# Patient Record
Sex: Male | Born: 1975 | Race: White | Hispanic: No | Marital: Married | Smoking: Former smoker
Health system: Southern US, Community
[De-identification: ages and names within clinical notes are randomized; demographics above are authoritative.]

## PROBLEM LIST (undated history)

## (undated) DIAGNOSIS — K30 Functional dyspepsia: Secondary | ICD-10-CM

## (undated) DIAGNOSIS — R600 Localized edema: Secondary | ICD-10-CM

## (undated) DIAGNOSIS — E669 Obesity, unspecified: Secondary | ICD-10-CM

## (undated) DIAGNOSIS — G4700 Insomnia, unspecified: Secondary | ICD-10-CM

## (undated) DIAGNOSIS — M1990 Unspecified osteoarthritis, unspecified site: Secondary | ICD-10-CM

## (undated) DIAGNOSIS — I10 Essential (primary) hypertension: Secondary | ICD-10-CM

## (undated) DIAGNOSIS — R7301 Impaired fasting glucose: Secondary | ICD-10-CM

## (undated) DIAGNOSIS — R7303 Prediabetes: Secondary | ICD-10-CM

## (undated) DIAGNOSIS — K219 Gastro-esophageal reflux disease without esophagitis: Secondary | ICD-10-CM

## (undated) DIAGNOSIS — K4090 Unilateral inguinal hernia, without obstruction or gangrene, not specified as recurrent: Secondary | ICD-10-CM

## (undated) DIAGNOSIS — R7401 Elevation of levels of liver transaminase levels: Secondary | ICD-10-CM

## (undated) DIAGNOSIS — K760 Fatty (change of) liver, not elsewhere classified: Secondary | ICD-10-CM

## (undated) DIAGNOSIS — K7581 Nonalcoholic steatohepatitis (NASH): Secondary | ICD-10-CM

## (undated) DIAGNOSIS — E785 Hyperlipidemia, unspecified: Secondary | ICD-10-CM

## (undated) DIAGNOSIS — K429 Umbilical hernia without obstruction or gangrene: Secondary | ICD-10-CM

## (undated) DIAGNOSIS — Z87898 Personal history of other specified conditions: Secondary | ICD-10-CM

## (undated) DIAGNOSIS — R0789 Other chest pain: Secondary | ICD-10-CM

## (undated) HISTORY — DX: Other chest pain: R07.89

## (undated) HISTORY — PX: NO PAST SURGERIES: SHX2092

## (undated) HISTORY — DX: Insomnia, unspecified: G47.00

## (undated) HISTORY — DX: Gastro-esophageal reflux disease without esophagitis: K21.9

## (undated) HISTORY — DX: Essential (primary) hypertension: I10

## (undated) HISTORY — DX: Nonalcoholic steatohepatitis (NASH): K75.81

## (undated) HISTORY — DX: Obesity, unspecified: E66.9

## (undated) HISTORY — DX: Impaired fasting glucose: R73.01

## (undated) HISTORY — DX: Hyperlipidemia, unspecified: E78.5

## (undated) HISTORY — DX: Functional dyspepsia: K30

---
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
## 1999-07-19 ENCOUNTER — Emergency Department (HOSPITAL_COMMUNITY): Admission: EM | Admit: 1999-07-19 | Discharge: 1999-07-20 | Payer: Self-pay | Admitting: Emergency Medicine

## 1999-11-29 ENCOUNTER — Emergency Department (HOSPITAL_COMMUNITY): Admission: EM | Admit: 1999-11-29 | Discharge: 1999-11-29 | Payer: Self-pay | Admitting: *Deleted

## 1999-12-01 ENCOUNTER — Emergency Department (HOSPITAL_COMMUNITY): Admission: EM | Admit: 1999-12-01 | Discharge: 1999-12-01 | Payer: Self-pay

## 2000-12-16 ENCOUNTER — Emergency Department (HOSPITAL_COMMUNITY): Admission: EM | Admit: 2000-12-16 | Discharge: 2000-12-16 | Payer: Self-pay | Admitting: Emergency Medicine

## 2000-12-26 ENCOUNTER — Emergency Department (HOSPITAL_COMMUNITY): Admission: EM | Admit: 2000-12-26 | Discharge: 2000-12-26 | Payer: Self-pay | Admitting: Emergency Medicine

## 2005-03-06 ENCOUNTER — Emergency Department (HOSPITAL_COMMUNITY): Admission: EM | Admit: 2005-03-06 | Discharge: 2005-03-06 | Payer: Self-pay | Admitting: Emergency Medicine

## 2005-04-20 ENCOUNTER — Ambulatory Visit: Payer: Self-pay | Admitting: Internal Medicine

## 2005-07-05 DIAGNOSIS — K30 Functional dyspepsia: Secondary | ICD-10-CM

## 2005-07-05 HISTORY — PX: OTHER SURGICAL HISTORY: SHX169

## 2005-07-05 HISTORY — DX: Functional dyspepsia: K30

## 2006-01-31 ENCOUNTER — Ambulatory Visit (HOSPITAL_COMMUNITY): Admission: RE | Admit: 2006-01-31 | Discharge: 2006-01-31 | Payer: Self-pay | Admitting: Gastroenterology

## 2006-02-03 ENCOUNTER — Ambulatory Visit (HOSPITAL_COMMUNITY): Admission: RE | Admit: 2006-02-03 | Discharge: 2006-02-03 | Payer: Self-pay | Admitting: Gastroenterology

## 2006-02-15 ENCOUNTER — Ambulatory Visit: Payer: Self-pay | Admitting: Internal Medicine

## 2006-03-08 ENCOUNTER — Ambulatory Visit: Payer: Self-pay | Admitting: Internal Medicine

## 2006-03-14 ENCOUNTER — Ambulatory Visit (HOSPITAL_COMMUNITY): Admission: RE | Admit: 2006-03-14 | Discharge: 2006-03-14 | Payer: Self-pay | Admitting: Gastroenterology

## 2006-03-14 ENCOUNTER — Encounter (INDEPENDENT_AMBULATORY_CARE_PROVIDER_SITE_OTHER): Payer: Self-pay | Admitting: Specialist

## 2006-06-03 ENCOUNTER — Ambulatory Visit: Payer: Self-pay | Admitting: Internal Medicine

## 2006-06-14 ENCOUNTER — Ambulatory Visit (HOSPITAL_COMMUNITY): Admission: RE | Admit: 2006-06-14 | Discharge: 2006-06-14 | Payer: Self-pay | Admitting: Gastroenterology

## 2006-08-03 ENCOUNTER — Ambulatory Visit: Payer: Self-pay | Admitting: Internal Medicine

## 2006-08-03 LAB — CONVERTED CEMR LAB
AST: 20 units/L (ref 0–37)
Albumin: 4.4 g/dL (ref 3.5–5.2)
Basophils Absolute: 0 10*3/uL (ref 0.0–0.1)
Bilirubin, Direct: 0.1 mg/dL (ref 0.0–0.3)
Chloride: 106 meq/L (ref 96–112)
Direct LDL: 161.8 mg/dL
Eosinophils Absolute: 0.1 10*3/uL (ref 0.0–0.6)
Eosinophils Relative: 2.6 % (ref 0.0–5.0)
GFR calc Af Amer: 127 mL/min
GFR calc non Af Amer: 105 mL/min
Glucose, Bld: 92 mg/dL (ref 70–99)
HCT: 45 % (ref 39.0–52.0)
HDL: 39.7 mg/dL (ref >39.0)
Lymphocytes Relative: 23.3 % (ref 12.0–46.0)
MCHC: 34.8 g/dL (ref 30.0–36.0)
MCV: 90 fL (ref 78.0–100.0)
Neutro Abs: 2.8 10*3/uL (ref 1.4–7.7)
Neutrophils Relative %: 63 % (ref 43.0–77.0)
Platelets: 236 10*3/uL (ref 150–400)
RBC: 5 M/uL (ref 4.22–5.81)
Sodium: 142 meq/L (ref 135–145)
TSH: 1.67 microintl units/mL (ref 0.35–5.50)
Triglycerides: 89 mg/dL (ref 0–149)

## 2006-08-10 ENCOUNTER — Ambulatory Visit: Payer: Self-pay | Admitting: Internal Medicine

## 2006-12-15 ENCOUNTER — Ambulatory Visit: Payer: Self-pay | Admitting: Internal Medicine

## 2006-12-15 LAB — CONVERTED CEMR LAB
AST: 17 units/L (ref 0–37)
Alkaline Phosphatase: 72 units/L (ref 39–117)
Bilirubin, Direct: 0.1 mg/dL (ref 0.0–0.3)
CO2: 30 meq/L (ref 19–32)
Cholesterol: 252 mg/dL (ref 0–200)
Creatinine, Ser: 0.9 mg/dL (ref 0.4–1.5)
GFR calc Af Amer: 127 mL/min
Glucose, Bld: 88 mg/dL (ref 70–99)
HDL: 45.8 mg/dL (ref >39.0)
Potassium: 3.9 meq/L (ref 3.5–5.1)
Sodium: 139 meq/L (ref 135–145)
Total Bilirubin: 1.2 mg/dL (ref 0.3–1.2)
Total Protein: 7.4 g/dL (ref 6.0–8.3)
Triglycerides: 99 mg/dL (ref 0–149)

## 2006-12-19 ENCOUNTER — Ambulatory Visit: Payer: Self-pay | Admitting: Internal Medicine

## 2007-02-16 ENCOUNTER — Ambulatory Visit: Payer: Self-pay | Admitting: Internal Medicine

## 2007-02-16 DIAGNOSIS — T7841XA Arthus phenomenon, initial encounter: Secondary | ICD-10-CM | POA: Insufficient documentation

## 2007-02-16 DIAGNOSIS — E785 Hyperlipidemia, unspecified: Secondary | ICD-10-CM | POA: Insufficient documentation

## 2007-02-16 LAB — CONVERTED CEMR LAB
ALT: 31 units/L (ref 0–53)
AST: 21 units/L (ref 0–37)
Alkaline Phosphatase: 87 units/L (ref 39–117)
Cholesterol: 189 mg/dL (ref 0–200)
HDL: 42 mg/dL (ref >39.0)
LDL Cholesterol: 136 mg/dL — ABNORMAL HIGH (ref 0–99)
Total Protein: 7.3 g/dL (ref 6.0–8.3)

## 2007-02-23 ENCOUNTER — Ambulatory Visit: Payer: Self-pay | Admitting: Internal Medicine

## 2007-02-23 DIAGNOSIS — I10 Essential (primary) hypertension: Secondary | ICD-10-CM

## 2007-02-23 DIAGNOSIS — K219 Gastro-esophageal reflux disease without esophagitis: Secondary | ICD-10-CM

## 2007-06-08 ENCOUNTER — Ambulatory Visit: Payer: Self-pay | Admitting: Internal Medicine

## 2007-06-08 LAB — CONVERTED CEMR LAB
AST: 17 units/L (ref 0–37)
Albumin: 4.2 g/dL (ref 3.5–5.2)
Alkaline Phosphatase: 70 units/L (ref 39–117)
Total CHOL/HDL Ratio: 3.5
Triglycerides: 51 mg/dL (ref 0–149)
VLDL: 10 mg/dL (ref 0–40)

## 2007-06-15 ENCOUNTER — Ambulatory Visit: Payer: Self-pay | Admitting: Internal Medicine

## 2007-06-15 LAB — CONVERTED CEMR LAB
Cholesterol, target level: 200 mg/dL
LDL Goal: 160 mg/dL

## 2007-10-02 ENCOUNTER — Ambulatory Visit: Payer: Self-pay | Admitting: Internal Medicine

## 2007-10-02 LAB — CONVERTED CEMR LAB
ALT: 37 units/L (ref 0–53)
AST: 24 units/L (ref 0–37)
Albumin: 4 g/dL (ref 3.5–5.2)
Alkaline Phosphatase: 65 units/L (ref 39–117)
Cholesterol: 200 mg/dL (ref 0–200)
LDL Cholesterol: 142 mg/dL — ABNORMAL HIGH (ref 0–99)
Total Protein: 7.2 g/dL (ref 6.0–8.3)
Triglycerides: 49 mg/dL (ref 0–149)

## 2007-10-09 ENCOUNTER — Ambulatory Visit: Payer: Self-pay | Admitting: Internal Medicine

## 2007-10-09 DIAGNOSIS — R0789 Other chest pain: Secondary | ICD-10-CM | POA: Insufficient documentation

## 2007-10-20 ENCOUNTER — Ambulatory Visit: Payer: Self-pay | Admitting: Internal Medicine

## 2007-10-26 ENCOUNTER — Telehealth: Payer: Self-pay | Admitting: Internal Medicine

## 2008-01-16 ENCOUNTER — Ambulatory Visit: Payer: Self-pay | Admitting: Internal Medicine

## 2008-01-16 DIAGNOSIS — T887XXA Unspecified adverse effect of drug or medicament, initial encounter: Secondary | ICD-10-CM

## 2008-01-16 LAB — CONVERTED CEMR LAB
ALT: 40 units/L (ref 0–53)
AST: 27 units/L (ref 0–37)
Alkaline Phosphatase: 72 units/L (ref 39–117)
Bilirubin, Direct: 0.1 mg/dL (ref 0.0–0.3)
HDL: 49.3 mg/dL (ref >39.0)
Total Protein: 7.6 g/dL (ref 6.0–8.3)

## 2008-01-19 ENCOUNTER — Ambulatory Visit: Payer: Self-pay | Admitting: Internal Medicine

## 2008-04-16 ENCOUNTER — Ambulatory Visit: Payer: Self-pay | Admitting: Internal Medicine

## 2008-04-16 LAB — CONVERTED CEMR LAB
Alkaline Phosphatase: 77 units/L (ref 39–117)
Bilirubin, Direct: 0.2 mg/dL (ref 0.0–0.3)
LDL Cholesterol: 120 mg/dL — ABNORMAL HIGH (ref 0–99)
Total Bilirubin: 1.2 mg/dL (ref 0.3–1.2)
Total CHOL/HDL Ratio: 4.2
VLDL: 17 mg/dL (ref 0–40)

## 2008-04-18 ENCOUNTER — Ambulatory Visit: Payer: Self-pay | Admitting: Internal Medicine

## 2008-07-26 ENCOUNTER — Telehealth: Payer: Self-pay | Admitting: *Deleted

## 2008-08-01 ENCOUNTER — Ambulatory Visit: Payer: Self-pay | Admitting: Internal Medicine

## 2008-08-01 LAB — CONVERTED CEMR LAB
ALT: 38 units/L (ref 0–53)
Bilirubin, Direct: 0.1 mg/dL (ref 0.0–0.3)
Calcium: 9.4 mg/dL (ref 8.4–10.5)
Creatinine, Ser: 0.9 mg/dL (ref 0.4–1.5)
GFR calc Af Amer: 125 mL/min
HDL: 42 mg/dL (ref >39.0)
Sodium: 141 meq/L (ref 135–145)
Total Bilirubin: 1.1 mg/dL (ref 0.3–1.2)
Total CHOL/HDL Ratio: 3.9
Total Protein: 7.7 g/dL (ref 6.0–8.3)
Triglycerides: 87 mg/dL (ref 0–149)
VLDL: 17 mg/dL (ref 0–40)

## 2008-08-05 ENCOUNTER — Ambulatory Visit: Payer: Self-pay | Admitting: Internal Medicine

## 2008-08-05 LAB — CONVERTED CEMR LAB: LDL Goal: 130 mg/dL

## 2008-09-25 ENCOUNTER — Telehealth: Payer: Self-pay | Admitting: *Deleted

## 2008-10-17 ENCOUNTER — Encounter: Payer: Self-pay | Admitting: Internal Medicine

## 2008-10-17 ENCOUNTER — Ambulatory Visit: Payer: Self-pay | Admitting: Internal Medicine

## 2008-10-17 DIAGNOSIS — M79609 Pain in unspecified limb: Secondary | ICD-10-CM

## 2008-12-04 ENCOUNTER — Telehealth: Payer: Self-pay | Admitting: Internal Medicine

## 2008-12-05 ENCOUNTER — Telehealth: Payer: Self-pay | Admitting: Internal Medicine

## 2009-03-05 ENCOUNTER — Ambulatory Visit: Payer: Self-pay | Admitting: Internal Medicine

## 2009-03-05 LAB — CONVERTED CEMR LAB
ALT: 27 units/L (ref 0–53)
AST: 20 units/L (ref 0–37)
Bilirubin, Direct: 0.1 mg/dL (ref 0.0–0.3)
Cholesterol: 212 mg/dL — ABNORMAL HIGH (ref 0–200)
Direct LDL: 158.4 mg/dL
Total Bilirubin: 1 mg/dL (ref 0.3–1.2)
Total CHOL/HDL Ratio: 5
Triglycerides: 120 mg/dL (ref 0.0–149.0)
VLDL: 24 mg/dL (ref 0.0–40.0)

## 2009-03-06 ENCOUNTER — Ambulatory Visit: Payer: Self-pay | Admitting: Internal Medicine

## 2009-03-06 DIAGNOSIS — M545 Low back pain: Secondary | ICD-10-CM | POA: Insufficient documentation

## 2009-07-18 ENCOUNTER — Ambulatory Visit: Payer: Self-pay | Admitting: Internal Medicine

## 2009-07-18 LAB — CONVERTED CEMR LAB
ALT: 27 units/L (ref 0–53)
AST: 21 units/L (ref 0–37)
Alkaline Phosphatase: 75 units/L (ref 39–117)
Bilirubin, Direct: 0 mg/dL (ref 0.0–0.3)
HDL: 51.2 mg/dL (ref >39.00)
Total Bilirubin: 1 mg/dL (ref 0.3–1.2)
Total Protein: 7.4 g/dL (ref 6.0–8.3)

## 2009-07-24 ENCOUNTER — Ambulatory Visit: Payer: Self-pay | Admitting: Internal Medicine

## 2009-10-16 ENCOUNTER — Ambulatory Visit: Payer: Self-pay | Admitting: Internal Medicine

## 2009-10-16 LAB — CONVERTED CEMR LAB
ALT: 31 units/L (ref 0–53)
AST: 23 units/L (ref 0–37)
Albumin: 4.2 g/dL (ref 3.5–5.2)
Alkaline Phosphatase: 73 units/L (ref 39–117)
Basophils Absolute: 0 10*3/uL (ref 0.0–0.1)
Blood in Urine, dipstick: NEGATIVE
Calcium: 9 mg/dL (ref 8.4–10.5)
Eosinophils Relative: 3.4 % (ref 0.0–5.0)
GFR calc non Af Amer: 117.36 mL/min (ref >60)
Glucose, Bld: 105 mg/dL — ABNORMAL HIGH (ref 70–99)
HDL: 52.1 mg/dL (ref >39.00)
Hemoglobin: 14.6 g/dL (ref 13.0–17.0)
LDL Cholesterol: 101 mg/dL — ABNORMAL HIGH (ref 0–99)
Lymphocytes Relative: 29.9 % (ref 12.0–46.0)
Monocytes Relative: 10.2 % (ref 3.0–12.0)
Neutro Abs: 2 10*3/uL (ref 1.4–7.7)
Nitrite: NEGATIVE
Potassium: 3.9 meq/L (ref 3.5–5.1)
RDW: 12.8 % (ref 11.5–14.6)
Sodium: 142 meq/L (ref 135–145)
Total Bilirubin: 0.4 mg/dL (ref 0.3–1.2)
Total CHOL/HDL Ratio: 3
Urobilinogen, UA: 0.2
VLDL: 21.4 mg/dL (ref 0.0–40.0)
WBC Urine, dipstick: NEGATIVE
WBC: 3.6 10*3/uL — ABNORMAL LOW (ref 4.5–10.5)
pH: 6

## 2010-04-29 ENCOUNTER — Telehealth: Payer: Self-pay | Admitting: Internal Medicine

## 2010-05-05 ENCOUNTER — Ambulatory Visit: Payer: Self-pay | Admitting: Internal Medicine

## 2010-05-05 LAB — CONVERTED CEMR LAB
HDL: 44.7 mg/dL (ref >39.00)
Total Bilirubin: 0.9 mg/dL (ref 0.3–1.2)
Total CHOL/HDL Ratio: 4
VLDL: 19.4 mg/dL (ref 0.0–40.0)

## 2010-05-11 ENCOUNTER — Ambulatory Visit: Payer: Self-pay | Admitting: Internal Medicine

## 2010-07-19 ENCOUNTER — Encounter: Payer: Self-pay | Admitting: Internal Medicine

## 2010-08-06 NOTE — Assessment & Plan Note (Signed)
Summary: fu on cpx labs done in april/njr   Vital Signs:  Patient profile:   35 year old male Height:      70 inches Weight:      227 pounds BMI:     32.69 Temp:     98.2 degrees F oral Pulse rate:   76 / minute Resp:     14 per minute BP sitting:   144 / 86  (left arm) Cuff size:   large  Vitals Entered By: Allyne Gee, LPN (May 11, 1606 10:49 AM) CC: pt here to discuss labs- had cpx with exercise ekg at [facility] in april Is Patient Diabetic? No   Primary Care Mariajose Mow:  Ricard Dillon MD  CC:  pt here to discuss labs- had cpx with exercise ekg at [facility] in april.  History of Present Illness: Right sided pain in the leg that is worse when he lays on the right side the pain is in the posterior thigh ( the ham strings) and the pain goes into the foot Family  hx of back dz and OA no recent xrays March CPX still need six months exam  Preventive Screening-Counseling & Management  Alcohol-Tobacco     Smoking Status: quit     Tobacco Counseling: to remain off tobacco products  Problems Prior to Update: 1)  Low Back Pain, Chronic  (ICD-724.2) 2)  Foot Pain, Left  (ICD-729.5) 3)  Foot Pain, Left  (ICD-729.5) 4)  Uns Advrs Eff Uns Rx Medicinal&biological Sbstnc  (ICD-995.20) 5)  Chest Pain, Atypical  (ICD-786.59) 6)  Gerd  (ICD-530.81) 7)  Hypertension  (ICD-401.9) 8)  Hyperlipidemia  (ICD-272.4) 9)  Advef, Drug/med/biol Subst, Arthus Phenomenon  (ICD-995.21) 10)  Family History Diabetes 1st Degree Relative  (ICD-V18.0)  Current Problems (verified): 1)  Low Back Pain, Chronic  (ICD-724.2) 2)  Foot Pain, Left  (ICD-729.5) 3)  Foot Pain, Left  (ICD-729.5) 4)  Uns Advrs Eff Uns Rx Medicinal&biological Sbstnc  (ICD-995.20) 5)  Chest Pain, Atypical  (ICD-786.59) 6)  Gerd  (ICD-530.81) 7)  Hypertension  (ICD-401.9) 8)  Hyperlipidemia  (ICD-272.4) 9)  Advef, Drug/med/biol Subst, Arthus Phenomenon  (ICD-995.21) 10)  Family History Diabetes 1st Degree  Relative  (ICD-V18.0)  Medications Prior to Update: 1)  Diovan Hct 160-12.5 Mg  Tabs (Valsartan-Hydrochlorothiazide) .... One By Mouth Daily 2)  Reglan 10 Mg  Tabs (Metoclopramide Hcl) .... Two Times A Day 3)  Nexium 40 Mg  Cpdr (Esomeprazole Magnesium) .... Two Times A Day 4)  Vytorin 10-40 Mg Tabs (Ezetimibe-Simvastatin) .... 1/2 Once Daily 5)  Alprazolam 0.5 Mg  Tabs (Alprazolam) .... One By Mouth Q 8 Hours As Needed  Current Medications (verified): 1)  Diovan Hct 160-12.5 Mg  Tabs (Valsartan-Hydrochlorothiazide) .... One By Mouth Daily 2)  Reglan 10 Mg  Tabs (Metoclopramide Hcl) .... Two Times A Day 3)  Nexium 40 Mg  Cpdr (Esomeprazole Magnesium) .... Two Times A Day 4)  Vytorin 10-40 Mg Tabs (Ezetimibe-Simvastatin) .... 1/2 Once Daily 5)  Alprazolam 0.5 Mg  Tabs (Alprazolam) .... One By Mouth Q 8 Hours As Needed  Allergies (verified): 1)  ! Cipro  Past History:  Family History: Last updated: 02/08/2007 Family History Diabetes 1st degree relative Family History of Cardiovascular disorder Fam hx CAD Fam hx CHF  Social History: Last updated: 02/08/2007 Married Alcohol use-yes Drug use-no Regular exercise-no Former Smoker  Risk Factors: Exercise: no (02/08/2007)  Risk Factors: Smoking Status: quit (05/11/2010)  Past medical, surgical, family and social histories (  including risk factors) reviewed, and no changes noted (except as noted below).  Past Medical History: Reviewed history from 10/09/2007 and no changes required. Hypertension Hyperlipidemia atypical chest pain  Past Surgical History: Reviewed history from 02/23/2007 and no changes required. Denies surgical history  Family History: Reviewed history from 02/08/2007 and no changes required. Family History Diabetes 1st degree relative Family History of Cardiovascular disorder Fam hx CAD Fam hx CHF  Social History: Reviewed history from 02/08/2007 and no changes required. Married Alcohol  use-yes Drug use-no Regular exercise-no Former Smoker  Review of Systems  The patient denies anorexia, fever, weight loss, weight gain, vision loss, decreased hearing, hoarseness, chest pain, syncope, dyspnea on exertion, peripheral edema, prolonged cough, headaches, hemoptysis, abdominal pain, melena, hematochezia, severe indigestion/heartburn, hematuria, incontinence, genital sores, muscle weakness, suspicious skin lesions, transient blindness, difficulty walking, depression, unusual weight change, abnormal bleeding, enlarged lymph nodes, angioedema, and breast masses.    Physical Exam  General:  Well-developed,well-nourished,in no acute distress; alert,appropriate and cooperative throughout examination;  Head:  atraumatic and male-pattern balding.   Eyes:  No corneal or conjunctival inflammation noted. EOMI. Perrla. Funduscopic exam benign, without hemorrhages, exudates or papilledema. Vision grossly normal. Ears:  R ear normal and L ear normal.   Nose:  no external deformity and no nasal discharge.   Mouth:  pharynx pink and moist and no erythema.   Lungs:  Normal respiratory effort, chest expands symmetrically. Lungs are clear to auscultation, no crackles or wheezes. Heart:  Normal rate and regular rhythm. S1 and S2 normal without gallop, murmur, click, rub or other extra sounds.   Impression & Recommendations:  Problem # 1:  LOW BACK PAIN, CHRONIC (ICD-724.2)  with possible radicular pain  Discussed use of moist heat or ice, modified activities, medications, and stretching/strengthening exercises. Back care instructions given. To be seen in 2 weeks if no improvement; sooner if worsening of symptoms.   Orders: T-Lumbar Spine w/Flex & Ext 4 Views (12751ZG)  Complete Medication List: 1)  Diovan Hct 160-12.5 Mg Tabs (Valsartan-hydrochlorothiazide) .... One by mouth daily 2)  Reglan 10 Mg Tabs (Metoclopramide hcl) .... Two times a day 3)  Nexium 40 Mg Cpdr (Esomeprazole magnesium)  .... Two times a day 4)  Vytorin 10-40 Mg Tabs (Ezetimibe-simvastatin) .... 1/2 once daily 5)  Alprazolam 0.5 Mg Tabs (Alprazolam) .... One by mouth q 8 hours as needed 6)  Methylprednisolone 4 Mg Tabs (Methylprednisolone) .... Three by mouth for 3 days and then 2 by mouth for 3 days then one by mouth for 3 days  Patient Instructions: 1)  Please schedule a follow-up appointment in 6 months. 2)  Hepatic Panel prior to visit, ICD-9:995.20 3)  Lipid Panel prior to visit, ICD-9:272.4 4)  report back by calling and leaving a message on bonnyes number about the effect of the medrol Prescriptions: METHYLPREDNISOLONE 4 MG TABS (METHYLPREDNISOLONE) three by mouth for 3 days and then 2 by mouth for 3 days then one by mouth for 3 days  #18 x 0   Entered and Authorized by:   Ricard Dillon MD   Signed by:   Ricard Dillon MD on 05/11/2010   Method used:   Electronically to        [facility]  [address] [phone] (retail)       [place],   [address]       Ph: [phone] or [phone]  Fax: [phone]   RxID:   [id number]    Orders Added: 1)  T-Lumbar Spine w/Flex & Ext 4 Views [72120TC] 2)  Est. Patient Level IV [25241]

## 2010-08-06 NOTE — Progress Notes (Signed)
Summary: Pt req to get labs done prior to ov on 05/11/10  Phone Note Call from Patient Call back at [phone]   Caller: Patient Reason for Call: Acute Illness Summary of Call: Pt is sch for fup visit on 05/11/10. Pt is req to get blood work done a week prior to visit. Pls advise as to what blood work he needs.  Initial call taken by: Braulio Bosch,  April 29, 2010 9:17 AM  Follow-up for Phone Call        his appointment is for cpx and his labs were done in apri(in the ov note it has he is coming to go over april labs) and he never had cpx- does he want labs again? if so, order fasting cpx labs v70.0 Follow-up by: Allyne Gee, LPN,  April 29, 3793 9:21 AM  Additional Follow-up for Phone Call Additional follow up Details #1::        I called pt and he is concerned about the labs he had being 65 months old. I advised pt that he could come in for cpx labs prior to visit, but more than likely, his insurance would not cover those labs to be done again this year. Pt says that he still wants to check with Dr. Arnoldo Morale about getting more labs done.  Additional Follow-up by: Braulio Bosch,  April 29, 2010 9:36 AM    Additional Follow-up for Phone Call Additional follow up Details #2::    may repeatt the lipid and liver fasting 272.4  995.20 the rest ofd the lad will be good Follow-up by: Ricard Dillon MD,  April 30, 2010 9:00 AM   Appended Document: Pt req to get labs done prior to ov on 05/11/10 please see message dr Arnoldo Morale answered

## 2010-08-06 NOTE — Assessment & Plan Note (Signed)
Summary: 4 MONTH ROV/NJR/pt rsc/cjr/pt rsc from bmp/cjr   Vital Signs:  Patient profile:   35 year old male Height:      70 inches Weight:      227 pounds BMI:     32.69 Temp:     98.3 degrees F oral Pulse rate:   76 / minute Resp:     14 per minute BP sitting:   140 / 82  (left arm)  Vitals Entered By: Allyne Gee, LPN (July 24, 6999 10:07 AM) CC: roa labs- no diovan yesterday, Hypertension Management   CC:  roa labs- no diovan yesterday and Hypertension Management.  History of Present Illness: The pt has been the medication about one month ago the reglan for the GI and the vytorin look the same and he had taken two times the does missed dosed after than and doe not take it every day the family hx pf CAD and PAD is  signicant and her continues to use smokeless tobacco gerd is stable   Hypertension History:      Positive major cardiovascular risk factors include hyperlipidemia, hypertension, and family history for ischemic heart disease (males less than 72 years old).  Negative major cardiovascular risk factors include male age less than 5 years old, no history of diabetes, and non-tobacco-user status.        Further assessment for target organ damage reveals no history of ASHD, stroke/TIA, or peripheral vascular disease.     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 One By Mouth Daily 2)  Reglan 10 Mg  Tabs (Metoclopramide Hcl) .... Two Times A Day 3)  Nexium 40 Mg  Cpdr (Esomeprazole Magnesium) .... Two Times A Day 4)  Vytorin 10-40 Mg Tabs (Ezetimibe-Simvastatin) .... 1/2 Once Daily 5)  Alprazolam 0.5 Mg  Tabs (Alprazolam) .... One By Mouth Q 8 Hours As Needed  Current Medications (verified): 1)  Diovan Hct 160-12.5 Mg  Tabs (Valsartan-Hydrochlorothiazide) .... One By Mouth Daily 2)  Reglan 10 Mg  Tabs (Metoclopramide Hcl) .... Two Times A Day 3)  Nexium 40 Mg  Cpdr (Esomeprazole Magnesium) .... Two Times A Day 4)  Vytorin 10-40 Mg Tabs (Ezetimibe-Simvastatin) .... 1/2 Once Daily 5)  Alprazolam 0.5 Mg  Tabs (Alprazolam) .... One By Mouth Q 8 Hours As Needed  Allergies (verified): 1)  ! Cipro  Past History:  Family History: Last updated: 02/08/2007 Family History Diabetes 1st degree relative Family History of Cardiovascular disorder Fam hx CAD Fam hx CHF  Social History: Last updated: 02/08/2007 Married Alcohol use-yes Drug use-no Regular exercise-no Former Smoker  Risk Factors: Exercise: no (02/08/2007)  Risk Factors: Smoking Status: quit (07/24/2009)  Past medical, surgical, family and social histories (including risk factors) reviewed for relevance to current acute and chronic problems.  Past Medical History: Reviewed history from 10/09/2007 and no changes required. Hypertension Hyperlipidemia atypical chest pain  Past Surgical History: Reviewed history from 02/23/2007 and no changes required. Denies surgical history  Family History: Reviewed history from 02/08/2007 and no changes required. Family History Diabetes 1st degree relative Family History of Cardiovascular disorder Fam hx CAD Fam hx CHF  Social History: Reviewed history from 02/08/2007 and no changes required. Married Alcohol use-yes Drug use-no Regular exercise-no Former Smoker  Review of Systems       The  patient complains of severe indigestion/heartburn.  The patient denies anorexia, fever, weight loss, weight gain, vision loss, decreased hearing, hoarseness, chest pain, syncope, dyspnea on exertion, peripheral edema, prolonged cough, headaches, hemoptysis, abdominal pain, melena, hematochezia, hematuria, incontinence, genital sores, muscle weakness, suspicious skin lesions, transient blindness, difficulty walking, depression, unusual weight change, abnormal bleeding, enlarged lymph nodes, angioedema, breast masses, and testicular masses.    Physical Exam  General:  Well-developed,well-nourished,in no acute distress; alert,appropriate and cooperative throughout examination;  Head:  atraumatic and male-pattern balding.   Eyes:  No corneal or conjunctival inflammation noted. EOMI. Perrla. Funduscopic exam benign, without hemorrhages, exudates or papilledema. Vision grossly normal. Ears:  R ear normal and L ear normal.   Nose:  no external deformity and no nasal discharge.   Mouth:  pharynx pink and moist and no erythema.   Neck:  No deformities, masses, or tenderness noted. Lungs:  Normal respiratory effort, chest expands symmetrically. Lungs are clear to auscultation, no crackles or wheezes. Heart:  Normal rate and regular rhythm. S1 and S2 normal without gallop, murmur, click, rub or other extra sounds. Abdomen:  Bowel sounds positive,abdomen soft and non-tender without masses, organomegaly or hernias noted. Extremities:  No clubbing, cyanosis, edema, or deformity noted with normal full range of motion of all joints.   Neurologic:  No cranial nerve deficits noted. Station and gait are normal. Plantar reflexes are down-going bilaterally. DTRs are symmetrical throughout. Sensory, motor and coordinative functions appear intact.   Impression & Recommendations:  Problem # 1:  HYPERTENSION (ICD-401.9)  His updated medication list for this problem includes:    Diovan Hct 160-12.5 Mg Tabs  (Valsartan-hydrochlorothiazide) ..... One by mouth daily  BP today: 140/82 Prior BP: 130/80 (03/06/2009)  Prior 10 Yr Risk Heart Disease: 4 % (03/06/2009)  Labs Reviewed: K+: 3.7 (08/01/2008) Creat: : 0.9 (08/01/2008)   Chol: 209 (07/18/2009)   HDL: 51.20 (07/18/2009)   LDL: 103 (08/01/2008)   TG: 110.0 (07/18/2009)  Problem # 2:  GERD (ICD-530.81)  His updated medication list for this problem includes:    Nexium 40 Mg Cpdr (Esomeprazole magnesium) .Marland Kitchen..Marland Kitchen Two times a day  Labs Reviewed: Hgb: 15.7 (08/03/2006)   Hct: 45.0 (08/03/2006)  Problem # 3:  HYPERLIPIDEMIA (ICD-272.4) Assessment: Unchanged  His updated medication list for this problem includes:    Vytorin 10-40 Mg Tabs (Ezetimibe-simvastatin) .Marland Kitchen... 1/2 once daily  Labs Reviewed: SGOT: 21 (07/18/2009)   SGPT: 27 (07/18/2009)  Lipid Goals: Chol Goal: 200 (06/15/2007)   HDL Goal: 40 (06/15/2007)   LDL Goal: 130 (08/05/2008)   TG Goal: 150 (06/15/2007)  Prior 10 Yr Risk Heart Disease: 4 % (03/06/2009)   HDL:51.20 (07/18/2009), 45.40 (03/05/2009)  LDL:103 (08/01/2008), 120 (04/16/2008)  Chol:209 (07/18/2009), 212 (03/05/2009)  Trig:110.0 (07/18/2009), 120.0 (03/05/2009)  Complete Medication List: 1)  Diovan Hct 160-12.5 Mg Tabs (Valsartan-hydrochlorothiazide) .... One by mouth daily 2)  Reglan 10 Mg Tabs (Metoclopramide hcl) .... Two times a day 3)  Nexium 40 Mg Cpdr (Esomeprazole magnesium) .... Two times a day 4)  Vytorin 10-40 Mg Tabs (Ezetimibe-simvastatin) .... 1/2 once  daily 5)  Alprazolam 0.5 Mg Tabs (Alprazolam) .... One by mouth q 8 hours as needed  Hypertension Assessment/Plan:      The patient's hypertensive risk group is category B: At least one risk factor (excluding diabetes) with no target organ damage.  His calculated 10 year risk of coronary heart disease is 4 %.  Today's blood pressure is 140/82.  His blood pressure goal is < 140/90.  Patient Instructions: 1)  to get off the medications you have to  achieve and stay at certain goals 2)  for blood pressure it will take getting uder 200 pounds and staying there 3)   for the GERD meds ( reglan and the the nexium) weigth lss and eating right 4)  no late meals, portion control and elimination of fried food 5)  otherwize STAY ON MEDS 6)  Please schedule a follow-up appointment in 3 months.cpx

## 2010-08-12 NOTE — Letter (Signed)
Summary: [facility]   Imported By: Laural Benes 08/04/2010 10:41:15  _____________________________________________________________________  External Attachment:    Type:   Image     Comment:   External Document

## 2010-09-03 ENCOUNTER — Other Ambulatory Visit: Payer: Self-pay | Admitting: Internal Medicine

## 2010-09-14 ENCOUNTER — Other Ambulatory Visit: Payer: Self-pay | Admitting: Internal Medicine

## 2010-10-05 ENCOUNTER — Other Ambulatory Visit: Payer: Self-pay | Admitting: Internal Medicine

## 2010-11-10 ENCOUNTER — Other Ambulatory Visit (INDEPENDENT_AMBULATORY_CARE_PROVIDER_SITE_OTHER): Payer: BC Managed Care – PPO | Admitting: Internal Medicine

## 2010-11-10 DIAGNOSIS — E785 Hyperlipidemia, unspecified: Secondary | ICD-10-CM

## 2010-11-10 DIAGNOSIS — T887XXA Unspecified adverse effect of drug or medicament, initial encounter: Secondary | ICD-10-CM

## 2010-11-10 LAB — LDL CHOLESTEROL, DIRECT: Direct LDL: 146.6 mg/dL

## 2010-11-10 LAB — LIPID PANEL: Triglycerides: 103 mg/dL (ref 0.0–149.0)

## 2010-11-10 LAB — HEPATIC FUNCTION PANEL
ALT: 33 U/L (ref 0–53)
Albumin: 4 g/dL (ref 3.5–5.2)
Bilirubin, Direct: 0.1 mg/dL (ref 0.0–0.3)
Total Protein: 7.1 g/dL (ref 6.0–8.3)

## 2010-11-11 ENCOUNTER — Ambulatory Visit (INDEPENDENT_AMBULATORY_CARE_PROVIDER_SITE_OTHER): Payer: BC Managed Care – PPO | Admitting: Internal Medicine

## 2010-11-11 ENCOUNTER — Encounter: Payer: Self-pay | Admitting: Internal Medicine

## 2010-11-11 DIAGNOSIS — M542 Cervicalgia: Secondary | ICD-10-CM

## 2010-11-11 DIAGNOSIS — I10 Essential (primary) hypertension: Secondary | ICD-10-CM

## 2010-11-11 DIAGNOSIS — E785 Hyperlipidemia, unspecified: Secondary | ICD-10-CM

## 2010-11-11 NOTE — Progress Notes (Signed)
  Subjective:    Patient ID: Carlos James, male    DOB: 21-Mar-1976, 35 y.o.   MRN: [id number]  HPI The pt has noted hand and arm numbness in the AM. He has been seen buy the company [facility] and suggested spondylosis Patient has history of hypertension hyperlipidemia he is on medications for this he states he's been compliant mostly with his medication.  His diet is a real issue and that on weekends he will drink between 6 and 10 beers this is wasted calories and certainly affects both his blood glucose and his cholesterol.  He has a strong family history of heart disease diabetes and fatty liver disease and we cautioned him that without moderating his diet and losing weight that he is at high risk for developing these as well   Review of Systems  Constitutional: Negative for fever and fatigue.  HENT: Negative for hearing loss, congestion, neck pain and postnasal drip.   Eyes: Negative for discharge, redness and visual disturbance.  Respiratory: Negative for cough, shortness of breath and wheezing.   Cardiovascular: Negative for leg swelling.  Gastrointestinal: Negative for abdominal pain, constipation and abdominal distention.  Genitourinary: Negative for urgency and frequency.  Musculoskeletal: Negative for joint swelling and arthralgias.  Skin: Negative for color change and rash.  Neurological: Negative for weakness and light-headedness.  Hematological: Negative for adenopathy.  Psychiatric/Behavioral: Negative for behavioral problems.       Past Medical History  Diagnosis Date  . Hypertension   . Hyperlipidemia   . Atypical chest pain    History reviewed. No pertinent past surgical history.  reports that he has never smoked. He uses smokeless tobacco. He reports that he drinks alcohol. He reports that he does not use illicit drugs. family history includes Arthritis in his father and mother; Coronary artery disease in an unspecified family member; Heart disease in his father and  mother; and Hyperlipidemia in his father and mother. Allergies  Allergen Reactions  . Ciprofloxacin     REACTION: Nausea    Objective:   Physical Exam  Constitutional: He appears well-developed and well-nourished.  HENT:  Head: Normocephalic and atraumatic.  Eyes: Conjunctivae are normal. Pupils are equal, round, and reactive to light.  Neck: Normal range of motion. Neck supple.  Cardiovascular: Normal rate and regular rhythm.   Pulmonary/Chest: Effort normal and breath sounds normal.  Abdominal: Soft. Bowel sounds are normal.      Blood pressure 120/80, pulse 60, temperature 98.2 F (36.8 C), temperature source Oral, resp. rate 14, height 5' 11"  (1.803 m), weight 222 lb (100.699 kg).     Assessment & Plan:  The patient had screening work at work which showed a cholesterol of 171 with an HDL of 56 and an LDL of 99 on our screening the cholesterol is up a little bit at 212 but the HDL was also good at 52 and the LDL on our screening was 146 which was slightly high.  The variations in the cholesterol can be due to variations in diet and weight and activity.  I do see that the LDL on the screening blood work was a calculated LDL while we measure a direct LDL.  I have spent more than 30 minutes examining this patient face-to-face of which over half was spent in counseling

## 2010-11-11 NOTE — Patient Instructions (Addendum)
get weight below 200 but intermediate goal is to 210  Getting neck pillow to position or head and neck night so that it does not compress the nerve as it exits causing your hands together and arm numb

## 2010-11-20 NOTE — Op Note (Signed)
Carlos James, Carlos James                ACCOUNT NO.:  [id number]   MEDICAL RECORD NO.:  [id number]          PATIENT TYPE:  AMB   LOCATION:  ENDO                         FACILITY:  [facility]   PHYSICIAN:  Nelwyn Salisbury, M.D.  DATE OF BIRTH:  [birth year]   DATE OF PROCEDURE:  DATE OF DISCHARGE:  03/14/2006                                 OPERATIVE REPORT   PROCEDURE:  Colonoscopy with biopsies x6.   ENDOSCOPIST:  Nelwyn Salisbury, M.D.   INSTRUMENT USED:  Olympus video colonoscope.   INDICATIONS FOR PROCEDURE:  35 year old white male with a history of guaiac  positive stool and abdominal pain, rule out colonic polyps, masses, etc.   PREPROCEDURE PREPARATION:  Informed consent was obtained from the patient.  The patient was fasted for four hours prior to the procedure and prepped  with 20 Osmoprep pills the night prior to the procedure and 12 Osmoprep  pills the morning of the procedure.  The risks and benefits of the procedure  including a 10% miss rate of cancer and polyps were discussed with the  patient, as well.   PREPROCEDURE PHYSICAL:  Patient with stable vital signs.  Neck supple.  Chest clear to auscultation.  S1 and S2 regular.  Abdomen soft with normal  bowel sounds.   DESCRIPTION OF PROCEDURE:  The patient was placed in the left lateral  decubitus position, sedated with 125 mcg of Fentanyl and 12.5 mg Versed in  slow incremental doses.  Once the patient was adequately sedated, maintained  on low flow oxygen and continuous cardiac monitoring, the Olympus video  colonoscope was advanced from the rectum to the cecum.  The appendiceal  orifice and ileocecal valve were clearly visualized and photographed.  There  was evidence of early sigmoid diverticulosis.  Two small sessile polyps  biopsied from the rectum.  The terminal ileum appeared normal.  Patchy  erythema was noted in the proximal right colon which was biopsied to rule  out colitis.   IMPRESSION:  1. Early sigmoid  diverticulosis.  2. Two small sessile polyps biopsied from the rectum (cold biopsies x2).  3. Patchy erythema biopsied from the proximal right colon.  4. Normal terminal ileum.   RECOMMENDATIONS:  1. Await pathology results.  2. Avoid all non-steroidals including aspirin for the next two weeks.  3. Outpatient follow up in the next two weeks for further recommendations.  4. Repeat colonoscopy is advised at the age of 82 unless the patient has      any abnormal symptoms in the interim.  Repeat guaiac stool will be done      on his next visit.      Nelwyn Salisbury, M.D.  Electronically Signed     JNM/MEDQ  D:  03/16/2006  T:  03/16/2006  Job:  [id number]   cc:   Ricard Dillon, MD

## 2010-11-20 NOTE — Op Note (Signed)
NAMEDELRICK, DEHART                ACCOUNT NO.:  [id number]   MEDICAL RECORD NO.:  [id number]          PATIENT TYPE:  AMB   LOCATION:  ENDO                         FACILITY:  [facility]   PHYSICIAN:  Nelwyn Salisbury, M.D.  DATE OF BIRTH:  1976/02/03   DATE OF PROCEDURE:  01/31/2006  DATE OF DISCHARGE:  01/31/2006                                 OPERATIVE REPORT   PROCEDURE PERFORMED:  Esophagogastroduodenoscopy.   ENDOSCOPIST:  Nelwyn Salisbury, M.D.   INSTRUMENT USED:  Olympus video panendoscope.   INDICATIONS FOR PROCEDURE:  35 year old white male with a history of  epigastric pain and guaiac positive stools undergoing an EGD to rule out  peptic ulcer disease, esophagitis, gastritis, etc.   PREPROCEDURE PREPARATION:  Informed consent was procured from the patient.  The patient fasted for four hours prior to the procedure.  Risks and  benefits of the procedure were discussed with the patient in great detail.   PREPROCEDURE PHYSICAL:  The patient had stable vital signs.  NECK:  Supple.  CHEST:  Clear to auscultation.  CARDIAC: Normal S1, S2.  ABDOMEN:  Soft with epigastric tenderness on palpation.  No guarding or  rebound.  No hepatosplenomegaly.   DESCRIPTION OF PROCEDURE:  The patient was placed in the left lateral  decubitus position and sedated with 100 mcg of fentanyl and 7.5 mg of Versed  in incremental doses.  Once the patient was adequately sedated, he was  maintained on low flow oxygen and continuous cardiac monitoring.  The  Olympus video panendoscope was advanced through the mouth piece over the  tongue into the esophagus and under direct vision the entire esophagus  appeared normal with no evidence of ring suture masses, esophagitis or  Barrett's mucosa.  The scope was then advanced into the stomach.  A large  amount of debris was seen in the proximal stomach.  Question gastroparesis.  The rest of the gastric mucosa appeared healthy.  The patient's position was  changed from the left lateral to the supine and the right lateral position  to mobilize the debris in the stomach and visualize the underlying mucosa.  No ulcers, erosions, masses or polyps were identified.  The proximal small  bowel appeared normal.  There was no ulcerative obstruction.  The patient  tolerated the procedure well without immediate complications.   IMPRESSION:  1.  Normal appearing esophagus.  2.  Large amount of debris in the proximal stomach, rule out gastroparesis.  3.  No ulcers, masses or polyps seen.  4.  Normal proximal small bowel.   RECOMMENDATIONS:  1.  A gastric emptying study will be schedule for the patient to further      evaluate his symptoms.  2.  Outpatient follow up in the next two weeks for repeat guaiac testing.      Further recommendation will be made thereafter.      Nelwyn Salisbury, M.D.  Electronically Signed     JNM/MEDQ  D:  02/01/2006  T:  02/02/2006  Job:  [id number]   cc:   Ricard Dillon, M.D. [facility]

## 2011-02-22 ENCOUNTER — Other Ambulatory Visit: Payer: Self-pay | Admitting: Internal Medicine

## 2011-03-24 ENCOUNTER — Other Ambulatory Visit: Payer: Self-pay | Admitting: Internal Medicine

## 2011-03-30 ENCOUNTER — Encounter: Payer: Self-pay | Admitting: Internal Medicine

## 2011-03-30 ENCOUNTER — Ambulatory Visit (INDEPENDENT_AMBULATORY_CARE_PROVIDER_SITE_OTHER): Payer: BC Managed Care – PPO | Admitting: Internal Medicine

## 2011-03-30 VITALS — BP 120/74 | Temp 98.4°F | Wt 221.0 lb

## 2011-03-30 DIAGNOSIS — T6391XA Toxic effect of contact with unspecified venomous animal, accidental (unintentional), initial encounter: Secondary | ICD-10-CM

## 2011-03-30 DIAGNOSIS — T63461A Toxic effect of venom of wasps, accidental (unintentional), initial encounter: Secondary | ICD-10-CM

## 2011-03-30 MED ORDER — METHYLPREDNISOLONE ACETATE 80 MG/ML IJ SUSP
120.0000 mg | Freq: Once | INTRAMUSCULAR | Status: AC
  Administered 2011-03-30: 120 mg via INTRAMUSCULAR

## 2011-03-30 NOTE — Progress Notes (Signed)
  Subjective:    Patient ID: Carlos James, male    DOB: 1976-05-18, 35 y.o.   MRN: [id number]  HPI 35 year old patient who is seen today following a multiple yellow jacket stings that occurred 3 days ago he's had soft tissue swelling involving primarily the extremities and also significant pruritus it interferes with sleep. He has treated hypertension and dyslipidemia which have been stable. He has been using Benadryl with only modest benefit.     Review of Systems  Skin: Positive for rash.       Objective:   Physical Exam  Constitutional: He appears well-developed and well-nourished. No distress.       Blood pressure 120/74. Afebrile  Skin:       Soft tissue swelling and some mild erythema over the dorsal aspects of both hands and also his right lower extremity.          Assessment & Plan:   Status post multiple yellow jacket stings. He will continue Benadryl when necessary and especially at bedtime. He was treated with Depo-Medrol 120 mg IM

## 2011-03-30 NOTE — Patient Instructions (Signed)
Use Benadryl every 4-6 hours as needed for itching  Call or return to clinic prn if these symptoms worsen or fail to improve as anticipated.

## 2011-04-18 ENCOUNTER — Other Ambulatory Visit: Payer: Self-pay | Admitting: Internal Medicine

## 2011-05-12 ENCOUNTER — Other Ambulatory Visit (INDEPENDENT_AMBULATORY_CARE_PROVIDER_SITE_OTHER): Payer: BC Managed Care – PPO

## 2011-05-12 DIAGNOSIS — E785 Hyperlipidemia, unspecified: Secondary | ICD-10-CM

## 2011-05-12 LAB — HEPATIC FUNCTION PANEL
Bilirubin, Direct: 0 mg/dL (ref 0.0–0.3)
Total Bilirubin: 0.7 mg/dL (ref 0.3–1.2)

## 2011-05-12 LAB — LIPID PANEL
HDL: 61 mg/dL (ref >39.00)
LDL Cholesterol: 96 mg/dL (ref 0–99)
Total CHOL/HDL Ratio: 3
Triglycerides: 99 mg/dL (ref 0.0–149.0)
VLDL: 19.8 mg/dL (ref 0.0–40.0)

## 2011-05-13 ENCOUNTER — Ambulatory Visit (INDEPENDENT_AMBULATORY_CARE_PROVIDER_SITE_OTHER): Payer: BC Managed Care – PPO | Admitting: Internal Medicine

## 2011-05-13 ENCOUNTER — Encounter: Payer: Self-pay | Admitting: Internal Medicine

## 2011-05-13 VITALS — BP 142/90 | HR 76 | Temp 98.2°F | Resp 16 | Ht 71.0 in | Wt 230.0 lb

## 2011-05-13 DIAGNOSIS — E785 Hyperlipidemia, unspecified: Secondary | ICD-10-CM

## 2011-05-13 DIAGNOSIS — K219 Gastro-esophageal reflux disease without esophagitis: Secondary | ICD-10-CM

## 2011-05-13 DIAGNOSIS — M545 Low back pain, unspecified: Secondary | ICD-10-CM

## 2011-05-13 NOTE — Patient Instructions (Signed)
Patient was instructed to continue all medications as prescribed. To stop at the checkout desk and schedule a followup appointment  

## 2011-05-13 NOTE — Progress Notes (Signed)
  Subjective:    Patient ID: Carlos James, male    DOB: [birth year], 35 y.o.   MRN: [id number]  HPI  This is 35 year old white male who presents for followup of hyperlipidemia strong family history of cardiovascular disease he has a history of hypertension and hyperlipidemia.  Recheck his blood pressure was 130/80 his weight is 230 pounds with heavy boots on his weight was at home has reduced to below 220.  He states that he feels well he is now exercising on regular basis compliant with his medications.  His GERD is stable his low back pain has improved with weight loss and exercise he no longer has any atypical chest pain  Review of Systems  Constitutional: Negative for fever and fatigue.  HENT: Negative for hearing loss, congestion, neck pain and postnasal drip.   Eyes: Negative for discharge, redness and visual disturbance.  Respiratory: Negative for cough, shortness of breath and wheezing.   Cardiovascular: Negative for leg swelling.  Gastrointestinal: Negative for abdominal pain, constipation and abdominal distention.  Genitourinary: Negative for urgency and frequency.  Musculoskeletal: Negative for joint swelling and arthralgias.  Skin: Negative for color change and rash.  Neurological: Negative for weakness and light-headedness.  Hematological: Negative for adenopathy.  Psychiatric/Behavioral: Negative for behavioral problems.   Past Medical History  Diagnosis Date  . Hypertension   . Hyperlipidemia   . Atypical chest pain    No past surgical history on file.  reports that he has never smoked. He uses smokeless tobacco. He reports that he drinks alcohol. He reports that he does not use illicit drugs. family history includes Arthritis in his father and mother; Coronary artery disease in an unspecified family member; Heart disease in his father and mother; and Hyperlipidemia in his father and mother. Allergies  Allergen Reactions  . Ciprofloxacin     REACTION: Nausea          Objective:   Physical Exam  Nursing note and vitals reviewed. Constitutional: He appears well-developed and well-nourished.  HENT:  Head: Normocephalic and atraumatic.  Eyes: Conjunctivae are normal. Pupils are equal, round, and reactive to light.  Neck: Normal range of motion. Neck supple.  Cardiovascular: Normal rate and regular rhythm.   Pulmonary/Chest: Effort normal and breath sounds normal.  Abdominal: Soft. Bowel sounds are normal.  Musculoskeletal:       Fracture to left foot metatarsal          Assessment & Plan:  2 foot is healing and is now at 6 weeks he could resume activity with laced up athletic shoes instructed not to use loose shoes while an activity that increases risk of foot fracture at the waist up she is firmly and have good support shoes.  His lipids are at goal but that his HDL is the best recorded this is in part due to weight loss and in part due to his increased physical activity through aerobic exercise  Blood pressure is stable on current medications no changes wanted followup yearly exam in 6 months

## 2011-06-29 ENCOUNTER — Other Ambulatory Visit: Payer: Self-pay | Admitting: Internal Medicine

## 2011-07-06 HISTORY — PX: CARDIOVASCULAR STRESS TEST: SHX262

## 2011-07-16 ENCOUNTER — Encounter: Payer: Self-pay | Admitting: Family Medicine

## 2011-07-16 ENCOUNTER — Ambulatory Visit (INDEPENDENT_AMBULATORY_CARE_PROVIDER_SITE_OTHER): Payer: BC Managed Care – PPO | Admitting: Family Medicine

## 2011-07-16 ENCOUNTER — Ambulatory Visit: Payer: BC Managed Care – PPO | Admitting: Family Medicine

## 2011-07-16 VITALS — BP 120/88 | Temp 98.3°F | Wt 228.0 lb

## 2011-07-16 DIAGNOSIS — H538 Other visual disturbances: Secondary | ICD-10-CM

## 2011-07-16 DIAGNOSIS — I10 Essential (primary) hypertension: Secondary | ICD-10-CM

## 2011-07-16 DIAGNOSIS — R51 Headache: Secondary | ICD-10-CM

## 2011-07-16 NOTE — Progress Notes (Signed)
  Subjective:    Patient ID: Carlos James, male    DOB: 03-23-1976, 36 y.o.   MRN: [id number]  HPI  Patient seen as an acute visit. He has history of hypertension and takes Diovan HCTZ. Yesterday felt out of sorts with vague mild headache and had very transient bilateral blurred vision. Went to a [employer] loss adjust[employer] department with blood pressure 170/128. Blood sugar was 105 nonfasting and EKG strip was unremarkable. Patient called here was told to take extra one half tablet Diovan HCTZ which he did this morning. Presents now for further evaluation.  While patient being checked in here developed some bilateral blurred vision which he describes as wavy lines. Denied any nausea, vomiting, diplopia, confusion, syncope, seizure, or any focal neurologic symptoms.  Positive family history of CAD. Patient denies any recent exertional chest pain. He relates some mild chest discomfort this morning but he states this might have been soreness from EKG adhesive. He has some anterior chest soreness. Denies dyspnea. No history of migraine headache but does have family history of this.   Review of Systems  Constitutional: Negative for fever, chills and unexpected weight change.  Eyes: Negative for photophobia, pain and discharge.  Respiratory: Negative for cough and shortness of breath.   Cardiovascular: Negative for palpitations and leg swelling.  Gastrointestinal: Negative for abdominal pain.  Neurological: Positive for headaches. Negative for dizziness, seizures, syncope and weakness.  Hematological: Negative for adenopathy.  Psychiatric/Behavioral: Negative for dysphoric mood.       Objective:   Physical Exam  Constitutional: He is oriented to person, place, and time. He appears well-developed and well-nourished.  HENT:  Mouth/Throat: Oropharynx is clear and moist.  Eyes: Conjunctivae are normal. Pupils are equal, round, and reactive to light. Right eye exhibits no discharge. Left eye exhibits no  discharge. No scleral icterus.       Fundi benign. No hemorrhages.  Normal optic disc.  Neck: Neck supple.  Cardiovascular: Normal rate and regular rhythm.   Pulmonary/Chest: Effort normal and breath sounds normal. No respiratory distress. He has no wheezes. He has no rales.  Lymphadenopathy:    He has no cervical adenopathy.  Neurological: He is alert and oriented to person, place, and time. He has normal reflexes. No cranial nerve deficit. Coordination normal.  Psychiatric: He has a normal mood and affect. His behavior is normal.          Assessment & Plan:  #1 hypertension. Suboptimal control with severe elevation yesterday. Improved today with additional half tablets of Diovan/HCTZ. In fact, blood pressure 105/68 standing without orthostatic symptoms. Continue extra half tablet but stop promptly for any orthostatic symptoms. Schedule followup with primary in 2 weeks #2 question ocular migraine. Symptoms lasted about 15 minutes in office today then fully resolved. Unremarkable visual exam. Visual acuity preserved during episode.  Mild headache followed visual symptoms highly suggest ocular migraine.

## 2011-07-16 NOTE — Patient Instructions (Signed)
Continue to track blood pressure closely over next week and follow up if any further visual problems.

## 2011-07-19 ENCOUNTER — Telehealth: Payer: Self-pay | Admitting: Internal Medicine

## 2011-07-19 ENCOUNTER — Ambulatory Visit: Payer: BC Managed Care – PPO | Admitting: Family Medicine

## 2011-07-19 NOTE — Telephone Encounter (Signed)
Talked with pt and he states since he started taking extra 1/2 of diovan has almost "fallen out " before lunch-- today he didn't take bp med an d felt fine- bp this amm   Before getting up was 122/86

## 2011-07-19 NOTE — Telephone Encounter (Signed)
Pt called and has schd ov today with Dr Elease Hashimoto re: bp issues. Pt is req that Dr Arnoldo Morale or Stevie Kern calls pt prior to his ov this afternoon.

## 2011-07-19 NOTE — Telephone Encounter (Signed)
Per dr Arnoldo Morale- take only 1 diovan a day-limit salt and limit to 2 beers a day-pt informed-suggested opthamologist ov for eye problem

## 2011-07-20 ENCOUNTER — Other Ambulatory Visit: Payer: Self-pay | Admitting: Internal Medicine

## 2011-07-20 ENCOUNTER — Telehealth: Payer: Self-pay | Admitting: *Deleted

## 2011-07-20 DIAGNOSIS — R0989 Other specified symptoms and signs involving the circulatory and respiratory systems: Secondary | ICD-10-CM

## 2011-07-20 MED ORDER — ALPRAZOLAM 0.5 MG PO TABS: 0.5000 mg | ORAL_TABLET | Freq: Every evening | ORAL | Status: DC | PRN

## 2011-07-20 NOTE — Telephone Encounter (Signed)
Please let pt know it is has been called in

## 2011-07-20 NOTE — Telephone Encounter (Signed)
Pt is aware med call into pharm

## 2011-07-20 NOTE — Telephone Encounter (Signed)
Pt calls today c/o elevated bp again- and loss of vision- has appointment with opthamologist today- pt did not take diovan until last night at bedtime at which time bp was around 120/80--this am bp was elevated again with dyastolic number above 694- per edr jenkins continue diovan and send to cardiologist--pt informed and referral sent to terri

## 2011-07-20 NOTE — Telephone Encounter (Signed)
Pt need refill on generic xanax call into [facility]. Please call pt once med has been  Call into pharm

## 2011-07-21 ENCOUNTER — Encounter: Payer: Self-pay | Admitting: Neurology

## 2011-07-21 ENCOUNTER — Ambulatory Visit (INDEPENDENT_AMBULATORY_CARE_PROVIDER_SITE_OTHER): Payer: BC Managed Care – PPO | Admitting: Cardiovascular Disease

## 2011-07-21 ENCOUNTER — Telehealth: Payer: Self-pay | Admitting: Neurology

## 2011-07-21 ENCOUNTER — Encounter: Payer: Self-pay | Admitting: Cardiovascular Disease

## 2011-07-21 DIAGNOSIS — R0789 Other chest pain: Secondary | ICD-10-CM

## 2011-07-21 DIAGNOSIS — R51 Headache: Secondary | ICD-10-CM

## 2011-07-21 DIAGNOSIS — I119 Hypertensive heart disease without heart failure: Secondary | ICD-10-CM

## 2011-07-21 DIAGNOSIS — R072 Precordial pain: Secondary | ICD-10-CM

## 2011-07-21 DIAGNOSIS — H539 Unspecified visual disturbance: Secondary | ICD-10-CM

## 2011-07-21 DIAGNOSIS — I10 Essential (primary) hypertension: Secondary | ICD-10-CM

## 2011-07-21 LAB — BASIC METABOLIC PANEL
CO2: 27 mEq/L (ref 19–32)
Calcium: 9.2 mg/dL (ref 8.4–10.5)
Creatinine, Ser: 0.9 mg/dL (ref 0.4–1.5)
GFR: 104.07 mL/min (ref >60.00)
Sodium: 139 mEq/L (ref 135–145)

## 2011-07-21 MED ORDER — AMLODIPINE BESYLATE 5 MG PO TABS: 5.0000 mg | ORAL_TABLET | Freq: Every day | ORAL | Status: DC

## 2011-07-21 NOTE — Telephone Encounter (Signed)
Dr. Burt Knack wants to have pt worked in ASAP for visual changes and headaches. 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 Is there a good place to use?

## 2011-07-21 NOTE — Patient Instructions (Addendum)
Your physician has recommended you make the following change in your medication: START Amlodipine 64m take one by mouth daily  Non-Cardiac CT Angiography (CTA), is a special type of CT scan that uses a computer to produce multi-dimensional views of major blood vessels throughout the body. In CT angiography, a contrast material is injected through an IV to help visualize the blood vessels.  CTA of NECK to evaluate carotid and subclavian arteries--BMP drawn today--Nothing to eat or drink 4 hours prior to test.  Your physician has requested that you have an exercise stress myoview. For further information please visit [email or web address] Please follow instruction sheet, as given.  You have been [facility] for visual change and headache.  Your physician recommends that you schedule a follow-up appointment in: 2 WEEKS with Dr CBurt Knack

## 2011-07-21 NOTE — Telephone Encounter (Signed)
Dr. Antionette Char office ok'd an appt scheduled on 08/27/2011 @ 8:30 am. No further issues at this time.

## 2011-07-22 ENCOUNTER — Encounter: Payer: Self-pay | Admitting: Cardiovascular Disease

## 2011-07-22 DIAGNOSIS — R51 Headache: Secondary | ICD-10-CM | POA: Insufficient documentation

## 2011-07-22 NOTE — Assessment & Plan Note (Signed)
It's possible that the patient's headache and visual changes are related to malignant hypertension. I also think migraine with aura is in the differential. I've recommended a neurology evaluation. The patient will have a CT angiogram to evaluate blood pressure differences in the arms and it should also get a good evaluation of his carotid arteries.

## 2011-07-22 NOTE — Assessment & Plan Note (Signed)
The patient has chest discomfort which is relatively atypical. However, he has a very bad risk profile. Stress testing is warranted and he will undergo an exercise Myoview scan.

## 2011-07-22 NOTE — Assessment & Plan Note (Addendum)
The patient has labile blood pressures which had been difficult to manage. I think anxiety is playing a significant role here. He also has a significant pressure difference between the left and right arm. I recommended a CT angiogram of the aortic arch and great vessels to rule out subclavian stenosis or other significant vascular abnormality. Have recommended the patient add amlodipine 5 mg daily to his antihypertensive regimen. We discussed the impact of heavy alcohol and hypertension and the fact that he needs to cut back dramatically on his drinking. We'll followup with him after his studies are done. He was asked to continue with his blood pressure log.

## 2011-07-22 NOTE — Progress Notes (Signed)
HPI:  This is a 36 year old gentleman presenting for initial evaluation of labile hypertension. The patient was initially diagnosed with hypertension in 2003 when he was 36 years old. He has been on valsartan/hydrochlorothiazide for several years. Approximately 2 weeks ago he developed vision changes where he describes a "wavy line" and difficulty reading. He has now had several spells where he developed transient blurry vision. He just underwent a thorough ophthalmologic exam and no abnormalities were found. Concordant with the symptoms the patient describes a headache that has typically preceded his visual changes. He has complained of pain across the right side of the head and also along the left posterior neck at times. He denies photophobia, nausea, or vomiting. Says the symptoms have occurred over the past few weeks. He has had markedly elevated blood pressure readings. His diastolic blood pressures have been greater than 100. The patient's valsartan/hydrochlorothiazide was doubled to twice daily dosing. He tolerated this for a few days but he didn't had an episode where he became very weak and felt like he was going to pass out. When he recovered and went home his blood pressure was very low. He has subsequently backed off of his second dose of blood pressure medication.  The patient also describes a heaviness across his chest. He has had episodic chest pain for several years but this feels a little bit different. He describes it as a very mild discomfort. It is nonradiating and is not associated with exertion. The patient actually feels better when he does physical exercise. He denies dyspnea, palpitations, orthopnea, or PND.  The patient has a very strong family history of coronary artery disease. His father had coronary bypass surgery at a young age. He was first diagnosed with coronary disease in his 19s.  The patient uses smokeless tobacco. He also drinks alcohol fairly heavily. He admits to 5 beers  on the days when he does not work. 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  Outpatient Encounter Prescriptions as of 07/21/2011  Medication Sig Dispense Refill  . ALPRAZolam (XANAX) 0.5 MG tablet Take 1 tablet (0.5 mg total) by mouth at bedtime as needed.  30 tablet  3  . esomeprazole (NEXIUM) 40 MG capsule One tab every other day      . ezetimibe-simvastatin (VYTORIN) 10-40 MG per tablet Take 1 tablet by mouth as needed.       . metoCLOPramide (REGLAN) 10 MG tablet Take 10 mg by mouth daily.       . valsartan-hydrochlorothiazide (DIOVAN-HCT) 160-12.5 MG per tablet ONE TABLET BY MOUTH DAILY  30 tablet  3  . amLODipine (NORVASC) 5 MG tablet Take 1 tablet (5 mg total) by mouth daily.  30 tablet  6    Ciprofloxacin  Past Medical History  Diagnosis Date  . Hypertension   . Hyperlipidemia   . Atypical chest pain     No past surgical history on file.  History   Social History  . Marital Status: Married    Spouse Name: N/A    Number of Children: N/A  . Years of Education: N/A   Occupational History  . Not on file.   Social History Main Topics  . Smoking status: Former Research scientist (life sciences)  . Smokeless tobacco: Current User   Comment: uses 1 can a day  . Alcohol Use: Yes  . Drug Use: No  . Sexually Active: Yes   Other Topics Concern  . Not on file   Social  History Narrative   Regular exercise    Family History  Problem Relation Age of Onset  . Coronary artery disease    . Arthritis Mother   . Heart disease Mother   . Hyperlipidemia Mother   . Arthritis Father   . Heart disease Father   . Hyperlipidemia Father     ROS: General: no fevers/chills/night sweats Eyes: no blurry vision, diplopia, or amaurosis ENT: no sore throat or hearing loss Resp: no cough, wheezing, or hemoptysis CV: no edema or palpitations GI: no abdominal pain, nausea, vomiting, diarrhea, or constipation GU: no dysuria, frequency, or hematuria Skin: no rash Neuro: no  headache, numbness, tingling, or weakness of extremities Musculoskeletal: no joint pain or swelling Heme: no bleeding, DVT, or easy bruising Endo: no polydipsia or polyuria  BP 140/120  Pulse 72  Ht 5' 11"  (1.803 m)  Wt 102.876 kg (226 lb 12.8 oz)  BMI 31.63 kg/m2  PHYSICAL EXAM:  On my exam the blood pressure in the left arm is 148/110 and blood pressure in the right arm is 130/98 Pt is alert and oriented, WD, WN, in no distress. HEENT: normal Neck: JVP normal. Carotid upstrokes normal without bruits. No thyromegaly. Lungs: equal expansion, clear bilaterally CV: Apex is discrete and nondisplaced, RRR without murmur or gallop Abd: soft, NT, +BS, no bruit, no hepatosplenomegaly Back: no CVA tenderness Ext: no C/C/E        Femoral pulses 2+= without bruits        DP/PT pulses intact and = Skin: warm and dry without rash Neuro: CNII-XII intact             Strength intact = bilaterally  EKG:  07/16/11 - normal sinus rhythm 65 beats per minute, nonspecific T-wave abnormality.  ASSESSMENT AND PLAN:

## 2011-07-23 ENCOUNTER — Ambulatory Visit (INDEPENDENT_AMBULATORY_CARE_PROVIDER_SITE_OTHER)
Admission: RE | Admit: 2011-07-23 | Discharge: 2011-07-23 | Disposition: A | Discharge status: Home / Self Care | Payer: BC Managed Care – PPO | Source: Ambulatory Visit | Attending: Cardiovascular Disease | Admitting: Cardiovascular Disease

## 2011-07-23 DIAGNOSIS — R51 Headache: Secondary | ICD-10-CM

## 2011-07-23 DIAGNOSIS — I119 Hypertensive heart disease without heart failure: Secondary | ICD-10-CM

## 2011-07-23 DIAGNOSIS — R072 Precordial pain: Secondary | ICD-10-CM

## 2011-07-23 DIAGNOSIS — H539 Unspecified visual disturbance: Secondary | ICD-10-CM

## 2011-07-23 MED ORDER — IOHEXOL 350 MG/ML SOLN
100.0000 mL | Freq: Once | INTRAVENOUS | Status: AC | PRN
  Administered 2011-07-23: 100 mL via INTRAVENOUS

## 2011-07-27 ENCOUNTER — Ambulatory Visit (HOSPITAL_COMMUNITY): Payer: BC Managed Care – PPO | Attending: Cardiology | Admitting: Radiology

## 2011-07-27 DIAGNOSIS — R079 Chest pain, unspecified: Secondary | ICD-10-CM

## 2011-07-27 DIAGNOSIS — R072 Precordial pain: Secondary | ICD-10-CM

## 2011-07-27 DIAGNOSIS — Z8249 Family history of ischemic heart disease and other diseases of the circulatory system: Secondary | ICD-10-CM | POA: Insufficient documentation

## 2011-07-27 DIAGNOSIS — I119 Hypertensive heart disease without heart failure: Secondary | ICD-10-CM

## 2011-07-27 DIAGNOSIS — E785 Hyperlipidemia, unspecified: Secondary | ICD-10-CM | POA: Insufficient documentation

## 2011-07-27 DIAGNOSIS — Z87891 Personal history of nicotine dependence: Secondary | ICD-10-CM | POA: Insufficient documentation

## 2011-07-27 MED ORDER — TECHNETIUM TC 99M TETROFOSMIN IV KIT
30.0000 | PACK | Freq: Once | INTRAVENOUS | Status: AC | PRN
  Administered 2011-07-27: 30 via INTRAVENOUS

## 2011-07-27 MED ORDER — TECHNETIUM TC 99M TETROFOSMIN IV KIT
10.0000 | PACK | Freq: Once | INTRAVENOUS | Status: AC | PRN
  Administered 2011-07-27: 10 via INTRAVENOUS

## 2011-07-27 NOTE — Progress Notes (Signed)
[facility] [place] [address] [phone]  Cardiology Nuclear Med Study  [address] [name] is a 36 y.o. male [id number] [birth year]   Nuclear Med Background Indication for Stress Test:  Evaluation for Ischemia History:  No previous documented CAD, '08 GXT: NL with Dr. Arnoldo Morale Cardiac Risk Factors: Family History - CAD, History of Smoking, Hypertension and Lipids  Symptoms:  Chest Pain and Chest Pressure   Nuclear Pre-Procedure Caffeine/Decaff Intake:  7:00pm NPO After: 7:00pm,Patient had sips of OJ @730am  with meds   Lungs:  clear IV 0.9% NS with Angio Cath:  20g  IV Site: R Hand  IV Started by:  Matilde Haymaker, RN  Chest Size (in):  48 Cup Size: n/a  Height: 5' 11"  (1.803 m)  Weight:  224 lb (101.606 kg)  BMI:  Body mass index is 31.24 kg/(m^2). Tech Comments: This patient did very well walking on the treadmill for 13:00 minutes. No CP, or any other symptoms, but his BP was blunted throughout. His pictures were checked without any incidence or indication of any problems.    Nuclear Med Study 1 or 2 day study: 1 day  Stress Test Type:  Stress  Reading MD: Dola Argyle, MD  Order Authorizing Provider:  Legrand Como Cooper,MD  Resting Radionuclide: Technetium 50mTetrofosmin  Resting Radionuclide Dose: 11 mCi   Stress Radionuclide:  Technetium 953metrofosmin  Stress Radionuclide Dose: 33 mCi           Stress Protocol Rest HR: 76 Stress HR: 176  Rest BP: 132/80 Stress BP: 152/103  Exercise Time (min): 13:00 METS: 15.3   Predicted Max HR: 184 bpm % Max HR: 95.65 bpm Rate Pressure Product: 2634035 Dose of Adenosine (mg):  n/a Dose of Lexiscan: n/a mg  Dose of Atropine (mg): n/a Dose of Dobutamine: n/a mcg/kg/min (at max HR)  Stress Test Technologist: SaPerrin MalteseEMT-P  Nuclear Technologist:  StCharlton AmorCNMT     Rest Procedure:  Myocardial perfusion imaging was performed at rest 45 minutes following the  intravenous administration of Technetium 9928mtrofosmin. Rest ECG: NSR  Stress Procedure:  The patient exercised for 13:00.  The patient stopped due to fatigue and denied any chest pain.  There were no significant ST-T wave changes and a rare pvc.  Technetium 100m2mrofosmin was injected at peak exercise and myocardial perfusion imaging was performed after a brief delay. Stress ECG: No significant change from baseline ECG  QPS Raw Data Images:  Patient motion noted; appropriate software correction applied. Stress Images:  Normal homogeneous uptake in all areas of the myocardium. Rest Images:  Normal homogeneous uptake in all areas of the myocardium. Subtraction (SDS):  No evidence of ischemia. Transient Ischemic Dilatation (Normal <1.22):  .93 Lung/Heart Ratio (Normal <0.45):  .36  Quantitative Gated Spect Images QGS EDV:  117 ml QGS ESV:  46 ml QGS cine images:  Normal Wall Motion QGS EF: 61%  Impression Exercise Capacity:  Good exercise capacity. BP Response:  Normal blood pressure response. Clinical Symptoms:  No chest pain. ECG Impression:  No significant ST segment change suggestive of ischemia. Comparison with Prior Nuclear Study: No previous nuclear study performed  Overall Impression:  Normal stress nuclear study.  JeffDola Argyle

## 2011-07-30 ENCOUNTER — Ambulatory Visit: Payer: BC Managed Care – PPO | Admitting: Internal Medicine

## 2011-08-03 ENCOUNTER — Encounter: Payer: Self-pay | Admitting: Cardiovascular Disease

## 2011-08-03 ENCOUNTER — Ambulatory Visit (INDEPENDENT_AMBULATORY_CARE_PROVIDER_SITE_OTHER): Payer: BC Managed Care – PPO | Admitting: Cardiovascular Disease

## 2011-08-03 DIAGNOSIS — R0789 Other chest pain: Secondary | ICD-10-CM

## 2011-08-03 DIAGNOSIS — R51 Headache: Secondary | ICD-10-CM

## 2011-08-03 DIAGNOSIS — I10 Essential (primary) hypertension: Secondary | ICD-10-CM

## 2011-08-03 NOTE — Patient Instructions (Signed)
Please start taking Amlodipine in the morning.  I have contacted [facility] and placed you on the waiting list for an earlier appointment if one becomes available.   Your physician recommends that you schedule a follow-up appointment as needed with Dr Burt Knack.

## 2011-08-06 ENCOUNTER — Other Ambulatory Visit: Payer: Self-pay | Admitting: Internal Medicine

## 2011-08-09 NOTE — Assessment & Plan Note (Signed)
The patient has a neurology evaluation pending. His symptoms sound consistent with sinus congestion and I wonder if he may have sinusitis. I will ask Dr. Arnoldo Morale if he should have a CT of the sinuses or ENT evaluation.

## 2011-08-09 NOTE — Assessment & Plan Note (Signed)
No recurrence of this. The patient had excellent exercise tolerance and no ischemia on his Myoview stress scan. This study was reviewed in detail today.

## 2011-08-09 NOTE — Progress Notes (Signed)
HPI:  [name] presents for followup evaluation. He is a 36 year old gentleman who was seen January 16 for evaluation of malignant hypertension, chest pain, and generalized fatigue.  Please see that note for details of his history. In the interim, he has undergone cardiovascular testing with a CT angiogram of his head and neck vessels. This was ordered because of discrepancy in left and right arm blood pressures to rule out subclavian stenosis. There were no significant abnormalities seen. The patient did have a subclinical atherosclerosis noted with mild atherosclerosis of the carotid artery without associated stenosis. He also underwent a stress test that demonstrated excellent exercise tolerance without significant ischemia. His blood pressure has been improved but still was significantly elevated. The patient visual changes have improved.  He continues to have problems with headaches and mainly complains today of sinus congestion.  Outpatient Encounter Prescriptions as of 08/03/2011  Medication Sig Dispense Refill  . ALPRAZolam (XANAX) 0.5 MG tablet Take 1 tablet (0.5 mg total) by mouth at bedtime as needed.  30 tablet  3  . amLODipine (NORVASC) 5 MG tablet Take 1 tablet (5 mg total) by mouth daily.  30 tablet  6  . esomeprazole (NEXIUM) 40 MG capsule Take 40 mg by mouth daily.       Marland Kitchen ezetimibe-simvastatin (VYTORIN) 10-40 MG per tablet Take 1 tablet by mouth as needed.       . metoCLOPramide (REGLAN) 10 MG tablet Take 10 mg by mouth daily.       . valsartan-hydrochlorothiazide (DIOVAN-HCT) 160-12.5 MG per tablet ONE TABLET BY MOUTH DAILY  30 tablet  3    Allergies  Allergen Reactions  . Ciprofloxacin     REACTION: Nausea    Past Medical History  Diagnosis Date  . Hypertension   . Hyperlipidemia   . Atypical chest pain     ROS: Negative except as per HPI  BP 120/90  Pulse 76  Ht 5' 11"  (1.803 m)  Wt 104.781 kg (231 lb)  BMI 32.22 kg/m2  PHYSICAL EXAM: Pt is alert and oriented,  NAD HEENT: normal Neck: JVP - normal, carotids 2+= without bruits Lungs: CTA bilaterally CV: RRR without murmur or gallop Abd: soft, NT, Positive BS, no hepatomegaly Ext: no C/C/E, distal pulses intact and equal Skin: warm/dry no rash  ASSESSMENT AND PLAN:

## 2011-08-09 NOTE — Assessment & Plan Note (Signed)
The patient's blood pressure is improved. I asked him to move his amlodipine to the morning because he is complaining of fatigue when he first wakes and he currently takes his medications at night. I will defer further titration of his antihypertensives to Dr. Arnoldo Morale. I did not know how to explain the difference in his right and left arm blood pressures, but his right arm blood pressure is lower than the left.

## 2011-08-16 ENCOUNTER — Telehealth: Payer: Self-pay | Admitting: Cardiovascular Disease

## 2011-08-16 NOTE — Telephone Encounter (Signed)
Pt Called this am checking on [place] papers/Attending Physicians Statement, I let pt know Burt Knack will not Be back Into Office Until Wednesday 08/18/11, I will get this to Lauren upon her Coming back into the Office  08/16/11/KM

## 2011-08-18 NOTE — Telephone Encounter (Signed)
Spoke With Pt he called asking for Status of hs FMLA paperwork, Let him Know Burt Knack has His paperwork and It Should Be signed Today, He needs this faxed to [phone]..once faxed please call Pt @ [phone].I also Made Lauren aware of Pt's Request  08/18/11/KM

## 2011-08-27 ENCOUNTER — Encounter: Payer: Self-pay | Admitting: Neurology

## 2011-08-27 ENCOUNTER — Ambulatory Visit (INDEPENDENT_AMBULATORY_CARE_PROVIDER_SITE_OTHER): Payer: BC Managed Care – PPO | Admitting: Neurology

## 2011-08-27 ENCOUNTER — Ambulatory Visit: Payer: BC Managed Care – PPO | Admitting: Neurology

## 2011-08-27 VITALS — BP 118/70 | HR 72 | Ht 71.0 in | Wt 228.0 lb

## 2011-08-27 DIAGNOSIS — R51 Headache: Secondary | ICD-10-CM

## 2011-08-27 MED ORDER — SUMATRIPTAN SUCCINATE 100 MG PO TABS: 100.0000 mg | ORAL_TABLET | Freq: Once | ORAL | Status: DC | PRN

## 2011-08-27 NOTE — Progress Notes (Signed)
Dear Dr. Arnoldo Morale,  Thank you for having me see Carlos James in consultation today at [facility] for his problem with headaches and visual problems.  As you may recall, he is a 36 y.o. year old male with a history of hypertension who began having headaches preceded by a visual aura lasting minutes described as the "heat coming off the pavement".  The visual aura starts in his right eye and moves to his left.  With one of the headaches he was noted to have a blood pressure of 170/130.  The headache was described as left occipital that radiates to his frontal area.  It is pounding with photophobia and phonophobia.  It lasts hours.  Tylenol and BC powders do not abort it.  Thinking it was his high blood pressure he saw a cardiologist who did a stress test, and a CTA of his neck, both of which are unremarkable.  The CTA had been done because of a difference in blood pressure between his right and left arm.  They added amlodipine to his blood pressure medication, and this not only helped his blood pressure, but since it was started in January he has had only infrequent mild headaches.  He has not had any visual auras as well.  He says he is having problems "focusing" over the last two days, but denies double vision.  He also saw an ENT who has scheduled a sinus CT.   Past Medical History  Diagnosis Date  . Hypertension   . Hyperlipidemia   . Atypical chest pain     No past surgical history on file.  History   Social History  . Marital Status: Married    Spouse Name: N/A    Number of Children: N/A  . Years of Education: N/A   Social History Main Topics  . Smoking status: Former Research scientist (life sciences)  . Smokeless tobacco: Current User   Comment: uses 1 can a day, was a periodic smoker, but not now.  . Alcohol Use: Yes  . Drug Use: No  . Sexually Active: Yes   Other Topics Concern  . None   Social History Narrative   Regular exercise    Family History  Problem Relation Age of Onset   . Coronary artery disease    . Arthritis Mother   . Heart disease Mother   . Hyperlipidemia Mother   . Arthritis Father   . Heart disease Father   . Hyperlipidemia Father   - brother with migraine headaches.  Current Outpatient Prescriptions on File Prior to Visit  Medication Sig Dispense Refill  . ALPRAZolam (XANAX) 0.5 MG tablet Take 1 tablet (0.5 mg total) by mouth at bedtime as needed.  30 tablet  3  . amLODipine (NORVASC) 5 MG tablet Take 1 tablet (5 mg total) by mouth daily.  30 tablet  6  . esomeprazole (NEXIUM) 40 MG capsule Take 40 mg by mouth daily.       Marland Kitchen ezetimibe-simvastatin (VYTORIN) 10-40 MG per tablet Take 1 tablet by mouth as needed.       . metoCLOPramide (REGLAN) 10 MG tablet Take 10 mg by mouth daily.       . valsartan-hydrochlorothiazide (DIOVAN-HCT) 160-12.5 MG per tablet ONE TABLET BY MOUTH DAILY  30 tablet  3    Allergies  Allergen Reactions  . Ciprofloxacin     REACTION: Nausea      ROS:  13 systems were reviewed and are notable for joint pain, ringing in the ears,  nose bleeds, nasal congestion, sinus problems, anxiety  All other review of systems are unremarkable.   Examination:  Filed Vitals:   08/27/11 0827  BP: 118/70  Pulse: 72  Height: 5' 11"  (1.803 m)  Weight: 228 lb (103.42 kg)     In general, well appearing man.  Cardiovascular: The patient has a regular rate and rhythm and no carotid bruits.  Fundoscopy:  Disks are flat. Vessel caliber within normal limits.  Mental status:   The patient is oriented to person, place and time. Recent and remote memory are intact. Attention span and concentration are normal. Language including repetition, naming, following commands are intact. Fund of knowledge of current and historical events, as well as vocabulary are normal.  Cranial Nerves: Pupils are equally round and reactive to light. Visual fields full to confrontation. Extraocular movements are intact without nystagmus. Facial sensation  and muscles of mastication are intact. Muscles of facial expression are symmetric. Hearing intact to bilateral finger rub. Tongue protrusion, uvula, palate midline.  Shoulder shrug intact  Motor:  The patient has normal bulk and tone, no pronator drift.  There are no adventitious movements.  5/5 muscle strength bilaterally.  Reflexes:   Biceps  Triceps Brachioradialis Knee Ankle  Right 2+  2+  2+   2+ 2+  Left  2+  2+  2+   2+ 2+  Toes down  Coordination:  Normal finger to nose.  No dysdiadokinesia.  Sensation is intact to temperature and vibration.  Gait and Station are normal.  Tandem gait is intact.  Romberg is negative   Impression/Recs:  1. Migraine with aura - I think this likely caused his headaches, not there other way around.  I suspect the headaches either remitted on their own or amlodipine is acting as a prophylactic.  I am going to give the patient Imitrex prn for the headaches if they return. 2.  "Problem focusing" - I wondering if this could be lightheadedness from his now lower blood pressure or a residual of his migraine headaches.  I would guess the former.  I expect this will improve.  For now  I am going to hold off on head imaging.    We will see the patient back in 2 months.  Thank you for having [place] see Carlos James in consultation.  Feel free to contact me with any questions.  Kavin Leech Jacelyn Grip, MD [facility], [place] [address], [place] [address] Phone: [phone] Fax: [phone].

## 2011-09-16 ENCOUNTER — Ambulatory Visit: Payer: BC Managed Care – PPO | Admitting: Neurology

## 2011-09-29 ENCOUNTER — Other Ambulatory Visit: Payer: Self-pay | Admitting: Internal Medicine

## 2011-10-27 ENCOUNTER — Ambulatory Visit: Payer: BC Managed Care – PPO | Admitting: Neurology

## 2011-11-02 ENCOUNTER — Other Ambulatory Visit: Payer: Self-pay | Admitting: Internal Medicine

## 2011-11-08 ENCOUNTER — Other Ambulatory Visit: Payer: BC Managed Care – PPO | Admitting: Internal Medicine

## 2011-11-08 ENCOUNTER — Ambulatory Visit (INDEPENDENT_AMBULATORY_CARE_PROVIDER_SITE_OTHER): Payer: BC Managed Care – PPO | Admitting: Internal Medicine

## 2011-11-08 ENCOUNTER — Encounter: Payer: Self-pay | Admitting: Internal Medicine

## 2011-11-08 VITALS — BP 130/80 | HR 72 | Temp 98.3°F | Resp 16 | Ht 71.0 in | Wt 230.0 lb

## 2011-11-08 DIAGNOSIS — E785 Hyperlipidemia, unspecified: Secondary | ICD-10-CM

## 2011-11-08 DIAGNOSIS — I10 Essential (primary) hypertension: Secondary | ICD-10-CM

## 2011-11-08 MED ORDER — PITAVASTATIN CALCIUM 2 MG PO TABS: 1.0000 | ORAL_TABLET | ORAL | Status: DC

## 2011-11-08 NOTE — Progress Notes (Signed)
Subjective:    Patient ID: Carlos James, male    DOB: 1975/08/19, 36 y.o.   MRN: [id number]  HPI  This is a 36 year old male who presents for followup of hypertension hyperlipidemia and significant family cardiovascular risk factors.  He is followed by cardiology and ourselves for control of his blood pressure and control of his lipids.  He has been experiencing some early morning stiffness in his back that seems to be alleviated by activity.  He has been unable to tolerate the Vytorin for his cholesterol and it is critical that we get his cholesterol range to prevent further cardiovascular disease.  He's had a recent cardiovascular stress test which he did excellent and reached the 100% per percentile for stress without any activity showing cardiac risk. The  Review of Systems  Constitutional: Negative for fever and fatigue.  HENT: Negative for hearing loss, congestion, neck pain and postnasal drip.   Eyes: Negative for discharge, redness and visual disturbance.  Respiratory: Negative for cough, shortness of breath and wheezing.   Cardiovascular: Negative for leg swelling.  Gastrointestinal: Negative for abdominal pain, constipation and abdominal distention.  Genitourinary: Negative for urgency and frequency.  Musculoskeletal: Negative for joint swelling and arthralgias.  Skin: Negative for color change and rash.  Neurological: Negative for weakness and light-headedness.  Hematological: Negative for adenopathy.  Psychiatric/Behavioral: Negative for behavioral problems.   . Past Medical History  Diagnosis Date  . Hypertension   . Hyperlipidemia   . Atypical chest pain     History   Social History  . Marital Status: Married    Spouse Name: N/A    Number of Children: N/A  . Years of Education: N/A   Occupational History  . Not on file.   Social History Main Topics  . Smoking status: Former Research scientist (life sciences)  . Smokeless tobacco: Current User   Comment: uses 1 can a day, was  a periodic smoker, but not now.  . Alcohol Use: Yes  . Drug Use: No  . Sexually Active: Yes   Other Topics Concern  . Not on file   Social History Narrative   Regular exercise    No past surgical history on file.  Family History  Problem Relation Age of Onset  . Coronary artery disease    . Arthritis Mother   . Heart disease Mother   . Hyperlipidemia Mother   . Arthritis Father   . Heart disease Father   . Hyperlipidemia Father     Allergies  Allergen Reactions  . Ciprofloxacin     REACTION: Nausea    Current Outpatient Prescriptions on File Prior to Visit  Medication Sig Dispense Refill  . ALPRAZolam (XANAX) 0.5 MG tablet Take 1 tablet (0.5 mg total) by mouth at bedtime as needed.  30 tablet  3  . amLODipine (NORVASC) 5 MG tablet Take 1 tablet (5 mg total) by mouth daily.  30 tablet  6  . esomeprazole (NEXIUM) 40 MG capsule Take 40 mg by mouth daily.       Marland Kitchen ezetimibe-simvastatin (VYTORIN) 10-40 MG per tablet Take 1 tablet by mouth as needed.       . metoCLOPramide (REGLAN) 10 MG tablet Take 10 mg by mouth daily.       . SUMAtriptan (IMITREX) 100 MG tablet Take 1 tablet (100 mg total) by mouth once as needed for migraine.  9 tablet  3  . valsartan-hydrochlorothiazide (DIOVAN-HCT) 160-12.5 MG per tablet ONE TABLET BY MOUTH DAILY  30 tablet  3  .  DISCONTD: NEXIUM 40 MG capsule TAKE 1 CAPSULE TWO TIMES A DAY  60 capsule  1    BP 130/80  Pulse 72  Temp 98.3 F (36.8 C)  Resp 16  Ht 5' 11"  (1.803 m)  Wt 230 lb (104.327 kg)  BMI 32.08 kg/m2         Objective:   Physical Exam  Nursing note and vitals reviewed. Constitutional: He appears well-developed and well-nourished.  HENT:  Head: Normocephalic and atraumatic.  Eyes: Conjunctivae are normal. Pupils are equal, round, and reactive to light.  Neck: Normal range of motion. Neck supple.  Cardiovascular: Normal rate and regular rhythm.   Pulmonary/Chest: Effort normal and breath sounds normal.  Abdominal:  Soft. Bowel sounds are normal.          Assessment & Plan:  We spent 30 minutes face-to-face counseling this patient does cardiovascular risks we're well aware of his family history and we treated his mother and currently treat his father and brother there is a strong family history for heart disease and hyperlipidemia as well as fatty infiltration of the liver from elevated triglycerides.  Compliance with medication was reviewed as well as his overall risk factors control his blood pressure requires compliance not only with medications but with diet salt reduction and to this end reduction in alcohol and beer.

## 2011-11-08 NOTE — Patient Instructions (Signed)
The patient is instructed to continue all medications as prescribed. Schedule followup with check out clerk upon leaving the clinic  

## 2012-01-26 ENCOUNTER — Other Ambulatory Visit: Payer: Self-pay | Admitting: Internal Medicine

## 2012-02-21 ENCOUNTER — Other Ambulatory Visit (INDEPENDENT_AMBULATORY_CARE_PROVIDER_SITE_OTHER): Payer: BC Managed Care – PPO

## 2012-02-21 DIAGNOSIS — E785 Hyperlipidemia, unspecified: Secondary | ICD-10-CM

## 2012-02-21 DIAGNOSIS — I10 Essential (primary) hypertension: Secondary | ICD-10-CM

## 2012-02-21 LAB — HEPATIC FUNCTION PANEL
ALT: 35 U/L (ref 0–53)
AST: 24 U/L (ref 0–37)
Alkaline Phosphatase: 77 U/L (ref 39–117)
Bilirubin, Direct: 0.1 mg/dL (ref 0.0–0.3)
Total Bilirubin: 0.7 mg/dL (ref 0.3–1.2)

## 2012-02-21 LAB — BASIC METABOLIC PANEL
CO2: 28 mEq/L (ref 19–32)
Calcium: 8.9 mg/dL (ref 8.4–10.5)
Potassium: 3.7 mEq/L (ref 3.5–5.1)
Sodium: 140 mEq/L (ref 135–145)

## 2012-02-21 LAB — LIPID PANEL
Total CHOL/HDL Ratio: 4
Triglycerides: 109 mg/dL (ref 0.0–149.0)

## 2012-02-22 ENCOUNTER — Encounter: Payer: Self-pay | Admitting: Internal Medicine

## 2012-02-22 ENCOUNTER — Ambulatory Visit (INDEPENDENT_AMBULATORY_CARE_PROVIDER_SITE_OTHER): Payer: BC Managed Care – PPO | Admitting: Internal Medicine

## 2012-02-22 VITALS — BP 134/78 | HR 72 | Temp 98.2°F | Resp 16 | Ht 71.0 in | Wt 234.0 lb

## 2012-02-22 DIAGNOSIS — I10 Essential (primary) hypertension: Secondary | ICD-10-CM

## 2012-02-22 DIAGNOSIS — E785 Hyperlipidemia, unspecified: Secondary | ICD-10-CM

## 2012-02-22 DIAGNOSIS — R635 Abnormal weight gain: Secondary | ICD-10-CM

## 2012-02-22 MED ORDER — PITAVASTATIN CALCIUM 2 MG PO TABS: 1.0000 | ORAL_TABLET | ORAL | Status: DC

## 2012-02-22 NOTE — Progress Notes (Signed)
Subjective:    Patient ID: Carlos James, male    DOB: Mar 31, 1976, 36 y.o.   MRN: [id number]  HPI Patient admits to significant noncompliance with medications he has not been taking his cholesterol medication and this is reflected in his lipid profile.  His CBC is stable his esophageal reflux is controlled with Nexium his weight is increased and he admits to dietary noncompliance including increase use of beer.     Review of Systems  Constitutional: Negative for fever and fatigue.  HENT: Negative for hearing loss, congestion, neck pain and postnasal drip.   Eyes: Negative for discharge, redness and visual disturbance.  Respiratory: Negative for cough, shortness of breath and wheezing.   Cardiovascular: Negative for leg swelling.  Gastrointestinal: Negative for abdominal pain, constipation and abdominal distention.  Genitourinary: Negative for urgency and frequency.  Musculoskeletal: Negative for joint swelling and arthralgias.  Skin: Negative for color change and rash.  Neurological: Negative for weakness and light-headedness.  Hematological: Negative for adenopathy.  Psychiatric/Behavioral: Negative for behavioral problems.   Past Medical History  Diagnosis Date  . Hypertension   . Hyperlipidemia   . Atypical chest pain     History   Social History  . Marital Status: Married    Spouse Name: N/A    Number of Children: N/A  . Years of Education: N/A   Occupational History  . Not on file.   Social History Main Topics  . Smoking status: Former Research scientist (life sciences)  . Smokeless tobacco: Current User   Comment: uses 1 can a day, was a periodic smoker, but not now.  . Alcohol Use: Yes  . Drug Use: No  . Sexually Active: Yes   Other Topics Concern  . Not on file   Social History Narrative   Regular exercise    No past surgical history on file.  Family History  Problem Relation Age of Onset  . Coronary artery disease    . Arthritis Mother   . Heart disease Mother   .  Hyperlipidemia Mother   . Arthritis Father   . Heart disease Father   . Hyperlipidemia Father     Allergies  Allergen Reactions  . Ciprofloxacin     REACTION: Nausea    Current Outpatient Prescriptions on File Prior to Visit  Medication Sig Dispense Refill  . ALPRAZolam (XANAX) 0.5 MG tablet Take 1 tablet (0.5 mg total) by mouth at bedtime as needed.  30 tablet  3  . amLODipine (NORVASC) 5 MG tablet Take 1 tablet (5 mg total) by mouth daily.  30 tablet  6  . esomeprazole (NEXIUM) 40 MG capsule Take 40 mg by mouth daily.       . metoCLOPramide (REGLAN) 10 MG tablet Take 10 mg by mouth daily.       . valsartan-hydrochlorothiazide (DIOVAN-HCT) 160-12.5 MG per tablet ONE TABLET BY MOUTH DAILY  30 tablet  3  . DISCONTD: Pitavastatin Calcium (LIVALO) 2 MG TABS Take 1 tablet (2 mg total) by mouth 3 (three) times a week.  30 tablet  0  . DISCONTD: NEXIUM 40 MG capsule TAKE 1 CAPSULE TWO TIMES A DAY  60 capsule  1    BP 134/78  Pulse 72  Temp 98.2 F (36.8 C)  Resp 16  Ht 5' 11"  (1.803 m)  Wt 234 lb (106.142 kg)  BMI 32.64 kg/m2        Objective:   Physical Exam  Nursing note and vitals reviewed. Constitutional: He appears well-developed and well-nourished.  HENT:  Head: Normocephalic and atraumatic.  Eyes: Conjunctivae are normal. Pupils are equal, round, and reactive to light.  Neck: Normal range of motion. Neck supple.  Cardiovascular: Normal rate and regular rhythm.   Pulmonary/Chest: Effort normal and breath sounds normal.  Abdominal: Soft. Bowel sounds are normal.          Assessment & Plan:  Patient is a 1 or old male with significant family history of hyperlipidemia has been noncompliant with lipid lowering agent he will soon as lipid-lowering agent and we will recheck lipids in 3 months.  We gave him copies of a gluten-free diet for weight loss and discussed weight loss protocol with this gentleman his blood pressure stable his current medications GERD is  stable on Nexium

## 2012-02-22 NOTE — Patient Instructions (Signed)
Take the prednisone   56m a day for 3 days the 10 mg ( 1/2 tablet) for 6 days then off

## 2012-03-03 ENCOUNTER — Other Ambulatory Visit: Payer: Self-pay | Admitting: Cardiovascular Disease

## 2012-03-03 ENCOUNTER — Other Ambulatory Visit: Payer: Self-pay | Admitting: Internal Medicine

## 2012-04-26 ENCOUNTER — Other Ambulatory Visit: Payer: Self-pay | Admitting: Internal Medicine

## 2012-05-18 ENCOUNTER — Other Ambulatory Visit: Payer: BC Managed Care – PPO

## 2012-05-24 ENCOUNTER — Other Ambulatory Visit (INDEPENDENT_AMBULATORY_CARE_PROVIDER_SITE_OTHER): Payer: BC Managed Care – PPO

## 2012-05-24 DIAGNOSIS — E785 Hyperlipidemia, unspecified: Secondary | ICD-10-CM

## 2012-05-24 LAB — HEPATIC FUNCTION PANEL
ALT: 33 U/L (ref 0–53)
AST: 22 U/L (ref 0–37)
Albumin: 4.1 g/dL (ref 3.5–5.2)

## 2012-05-24 LAB — LIPID PANEL
Cholesterol: 214 mg/dL — ABNORMAL HIGH (ref 0–200)
HDL: 47.8 mg/dL (ref >39.00)
Total CHOL/HDL Ratio: 4
Triglycerides: 81 mg/dL (ref 0.0–149.0)

## 2012-05-24 LAB — LDL CHOLESTEROL, DIRECT: Direct LDL: 152.7 mg/dL

## 2012-05-25 ENCOUNTER — Ambulatory Visit (INDEPENDENT_AMBULATORY_CARE_PROVIDER_SITE_OTHER): Payer: BC Managed Care – PPO | Admitting: Internal Medicine

## 2012-05-25 ENCOUNTER — Encounter: Payer: Self-pay | Admitting: Internal Medicine

## 2012-05-25 VITALS — BP 140/90 | HR 72 | Temp 98.4°F | Resp 16 | Ht 71.0 in | Wt 237.0 lb

## 2012-05-25 DIAGNOSIS — E785 Hyperlipidemia, unspecified: Secondary | ICD-10-CM

## 2012-05-25 MED ORDER — ROSUVASTATIN CALCIUM 20 MG PO TABS: 20.0000 mg | ORAL_TABLET | Freq: Every day | ORAL | Status: DC

## 2012-05-25 NOTE — Patient Instructions (Addendum)
Crestor 20 mg weekly

## 2012-05-25 NOTE — Progress Notes (Signed)
Subjective:    Patient ID: Carlos James, male    DOB: 04-23-1976, 36 y.o.   MRN: [id number]  HPI  The patient is followed  For lipids The liver is normal and the lipids are improved HDL was 47.80 and the LDL was 152 Patient had an injury to his foot with a fracture of his left foot approximately a year ago he was treated for a foot fracture after x-ray revealed. He was given an orthotic shoe. The pain resolved but that he has had recurrent pain in the same location is concerned about refracture or arthritic changes   Review of Systems  Constitutional: Negative for fever and fatigue.  HENT: Negative for hearing loss, congestion, neck pain and postnasal drip.   Eyes: Negative for discharge, redness and visual disturbance.  Respiratory: Negative for cough, shortness of breath and wheezing.   Cardiovascular: Negative for leg swelling.  Gastrointestinal: Negative for abdominal pain, constipation and abdominal distention.  Genitourinary: Negative for urgency and frequency.  Musculoskeletal: Positive for joint swelling and arthralgias.       Pain with a history of foot fracture  Skin: Negative for color change and rash.  Neurological: Negative for weakness and light-headedness.  Hematological: Negative for adenopathy.  Psychiatric/Behavioral: Negative for behavioral problems.   Past Medical History  Diagnosis Date  . Hypertension   . Hyperlipidemia   . Atypical chest pain     History   Social History  . Marital Status: Married    Spouse Name: N/A    Number of Children: N/A  . Years of Education: N/A   Occupational History  . Not on file.   Social History Main Topics  . Smoking status: Former Research scientist (life sciences)  . Smokeless tobacco: Current User     Comment: uses 1 can a day, was a periodic smoker, but not now.  . Alcohol Use: Yes  . Drug Use: No  . Sexually Active: Yes   Other Topics Concern  . Not on file   Social History Narrative   Regular exercise    No past surgical  history on file.  Family History  Problem Relation Age of Onset  . Coronary artery disease    . Arthritis Mother   . Heart disease Mother   . Hyperlipidemia Mother   . Arthritis Father   . Heart disease Father   . Hyperlipidemia Father     Allergies  Allergen Reactions  . Ciprofloxacin     REACTION: Nausea    Current Outpatient Prescriptions on File Prior to Visit  Medication Sig Dispense Refill  . ALPRAZolam (XANAX) 0.5 MG tablet Take 1 tablet (0.5 mg total) by mouth at bedtime as needed.  30 tablet  3  . amLODipine (NORVASC) 5 MG tablet TAKE 1 TABLET (5 MG TOTAL) BY MOUTH DAILY.  30 tablet  9  . esomeprazole (NEXIUM) 40 MG capsule Take 40 mg by mouth daily.       . metoCLOPramide (REGLAN) 10 MG tablet Take 10 mg by mouth daily.       . Pitavastatin Calcium (LIVALO) 2 MG TABS Take 1 tablet (2 mg total) by mouth 2 (two) times a week. Monday and friday  30 tablet  0  . valsartan-hydrochlorothiazide (DIOVAN-HCT) 160-12.5 MG per tablet ONE TABLET BY MOUTH DAILY  30 tablet  3  . [DISCONTINUED] metoCLOPramide (REGLAN) 10 MG tablet TAKE ONE TABLET BY MOUTH TWICE DAILY  180 tablet  2    BP 140/90  Pulse 72  Temp 98.4 F (  36.9 C)  Resp 16  Ht 5' 11"  (1.803 m)  Wt 237 lb (107.502 kg)  BMI 33.05 kg/m2        Objective:   Physical Exam  Constitutional: He appears well-developed and well-nourished.  HENT:  Head: Normocephalic and atraumatic.  Eyes: Conjunctivae normal are normal. Pupils are equal, round, and reactive to light.  Neck: Normal range of motion. Neck supple.  Cardiovascular: Normal rate and regular rhythm.   Pulmonary/Chest: Effort normal and breath sounds normal.  Abdominal: Soft. Bowel sounds are normal.  Musculoskeletal:       Inflammation of left foot at the base of the second toe          Assessment & Plan:  We will have an x-ray  After review he'll be referred to an orthopedist or informed of results.  Blood pressure stable on current  protocol.  Cholesterol is improved with a 20 point drop and total cholesterol a stability of HL cholesterol and a 15 point drop in LDL cholesterol.  We still have improvement in his goal primary prevention in this gentleman with family risk factors  Try Crestor 20 mg once a week urge compliance with the medication because of his family risk factors

## 2012-07-06 ENCOUNTER — Other Ambulatory Visit: Payer: Self-pay | Admitting: Internal Medicine

## 2012-09-09 ENCOUNTER — Other Ambulatory Visit: Payer: Self-pay | Admitting: Internal Medicine

## 2012-09-21 ENCOUNTER — Other Ambulatory Visit (INDEPENDENT_AMBULATORY_CARE_PROVIDER_SITE_OTHER): Payer: BC Managed Care – PPO

## 2012-09-21 DIAGNOSIS — E785 Hyperlipidemia, unspecified: Secondary | ICD-10-CM

## 2012-09-21 LAB — HEPATIC FUNCTION PANEL
ALT: 30 U/L (ref 0–53)
AST: 19 U/L (ref 0–37)
Bilirubin, Direct: 0.1 mg/dL (ref 0.0–0.3)
Total Bilirubin: 0.6 mg/dL (ref 0.3–1.2)

## 2012-09-21 LAB — LIPID PANEL
Cholesterol: 243 mg/dL — ABNORMAL HIGH (ref 0–200)
Total CHOL/HDL Ratio: 6
VLDL: 28.4 mg/dL (ref 0.0–40.0)

## 2012-09-21 LAB — LDL CHOLESTEROL, DIRECT: Direct LDL: 171.3 mg/dL

## 2012-09-22 ENCOUNTER — Other Ambulatory Visit: Payer: BC Managed Care – PPO

## 2012-09-25 ENCOUNTER — Ambulatory Visit: Payer: BC Managed Care – PPO | Admitting: Internal Medicine

## 2012-09-29 ENCOUNTER — Encounter: Payer: Self-pay | Admitting: Internal Medicine

## 2012-09-29 ENCOUNTER — Ambulatory Visit (INDEPENDENT_AMBULATORY_CARE_PROVIDER_SITE_OTHER): Payer: BC Managed Care – PPO | Admitting: Internal Medicine

## 2012-09-29 VITALS — BP 136/80 | HR 76 | Temp 98.0°F | Resp 16 | Ht 71.0 in | Wt 230.0 lb

## 2012-09-29 DIAGNOSIS — E785 Hyperlipidemia, unspecified: Secondary | ICD-10-CM

## 2012-09-29 DIAGNOSIS — T887XXA Unspecified adverse effect of drug or medicament, initial encounter: Secondary | ICD-10-CM

## 2012-09-29 NOTE — Patient Instructions (Signed)
The patient is instructed to continue all medications as prescribed. Schedule followup with check out clerk upon leaving the clinic  

## 2012-09-29 NOTE — Progress Notes (Signed)
Subjective:    Patient ID: Carlos James, male    DOB: 29-Sep-1975, 37 y.o.   MRN: [id number]  HPI Lipid management   Review of Systems  Constitutional: Negative for fever and fatigue.  HENT: Negative for hearing loss, congestion, neck pain and postnasal drip.   Eyes: Negative for discharge, redness and visual disturbance.  Respiratory: Negative for cough, shortness of breath and wheezing.   Cardiovascular: Negative for leg swelling.  Gastrointestinal: Negative for abdominal pain, constipation and abdominal distention.  Genitourinary: Negative for urgency and frequency.  Musculoskeletal: Negative for joint swelling and arthralgias.  Skin: Negative for color change and rash.  Neurological: Negative for weakness and light-headedness.  Hematological: Negative for adenopathy.  Psychiatric/Behavioral: Negative for behavioral problems.   Past Medical History  Diagnosis Date  . Hypertension   . Hyperlipidemia   . Atypical chest pain     History   Social History  . Marital Status: Married    Spouse Name: N/A    Number of Children: N/A  . Years of Education: N/A   Occupational History  . Not on file.   Social History Main Topics  . Smoking status: Former Research scientist (life sciences)  . Smokeless tobacco: Current User     Comment: uses 1 can a day, was a periodic smoker, but not now.  . Alcohol Use: Yes  . Drug Use: No  . Sexually Active: Yes   Other Topics Concern  . Not on file   Social History Narrative   Regular exercise          No past surgical history on file.  Family History  Problem Relation Age of Onset  . Coronary artery disease    . Arthritis Mother   . Heart disease Mother   . Hyperlipidemia Mother   . Arthritis Father   . Heart disease Father   . Hyperlipidemia Father     Allergies  Allergen Reactions  . Ciprofloxacin     REACTION: Nausea    Current Outpatient Prescriptions on File Prior to Visit  Medication Sig Dispense Refill  . ALPRAZolam (XANAX) 0.5  MG tablet Take 1 tablet (0.5 mg total) by mouth at bedtime as needed.  30 tablet  3  . amLODipine (NORVASC) 5 MG tablet TAKE 1 TABLET (5 MG TOTAL) BY MOUTH DAILY.  30 tablet  9  . metoCLOPramide (REGLAN) 10 MG tablet Take 10 mg by mouth daily.       Marland Kitchen NEXIUM 40 MG capsule TAKE 1 CAPSULE TWO TIMES A DAY  60 capsule  1  . valsartan-hydrochlorothiazide (DIOVAN-HCT) 160-12.5 MG per tablet ONE TABLET BY MOUTH DAILY  30 tablet  3  . rosuvastatin (CRESTOR) 20 MG tablet Take 1 tablet (20 mg total) by mouth daily.  90 tablet  3   No current facility-administered medications on file prior to visit.    BP 136/80  Pulse 76  Temp(Src) 98 F (36.7 C)  Resp 16  Ht 5' 11"  (1.803 m)  Wt 230 lb (104.327 kg)  BMI 32.09 kg/m2       Objective:   Physical Exam  Constitutional: He appears well-developed and well-nourished.  HENT:  Head: Normocephalic and atraumatic.  Eyes: Conjunctivae are normal. Pupils are equal, round, and reactive to light.  Neck: Normal range of motion. Neck supple.  Cardiovascular: Normal rate and regular rhythm.   Pulmonary/Chest: Effort normal and breath sounds normal.  Abdominal: Soft. Bowel sounds are normal.          Assessment & Plan:  Take the medications every third day due to the risk factors and the family risks  The patient has changed diet ( eliminated fries)  Discussion of gluten free diet

## 2012-11-08 ENCOUNTER — Other Ambulatory Visit: Payer: Self-pay | Admitting: Internal Medicine

## 2012-11-24 ENCOUNTER — Telehealth: Payer: Self-pay | Admitting: Internal Medicine

## 2012-11-24 MED ORDER — DOXYCYCLINE HYCLATE 100 MG PO TABS: 100.0000 mg | ORAL_TABLET | Freq: Two times a day (BID) | ORAL | Status: DC

## 2012-11-24 NOTE — Telephone Encounter (Signed)
Per dr Arnoldo Morale-  May have doxycycline 100 bid for 10 days, clear liquid diet and imodium for diarrhea. Pt informed and [facility]cyline [facility] [place]

## 2012-11-24 NOTE — Telephone Encounter (Signed)
Patient Information:  Caller Name: Ayan  Phone: [phone]  Patient: Carlos, James  Gender: Male  DOB: 02-Dec-1975  Age: 37 Years  PCP: Benay Pillow (Adults only)  Office Follow Up:  Does the office need to follow up with this patient?: Yes  Instructions For The Office: Diarrhea and pt has travel out of the country .Marland Kitchen   Symptoms  Reason For Call & Symptoms: Diarrhea on 11/19/12.  Resolved 11/23/12.  Diarrhea presents again 11/23/12.  Pt was in [place] last week and returned on 11/20/12.  Feels nauseated with watery diarrhea.  Pt was bitten by a tick on his scrotum a couple of weeks.  A small red bump. No sx of infection.  Pt seen at [facility] 11/22/12 diagnosed with 11/22/12  Reviewed Health History In EMR: Yes  Reviewed Medications In EMR: Yes  Reviewed Allergies In EMR: Yes  Reviewed Surgeries / Procedures: Yes  Date of Onset of Symptoms: 11/19/2012  Treatments Tried: Immodium Pepto Bismol  Treatments Tried Worked: No  Guideline(s) Used:  Diarrhea  Disposition Per Guideline:   Callback by PCP Today  Reason For Disposition Reached:   Travel to a foreign country in past month  Advice Given:  Fluids:  Drink more fluids, at least 8-10 glasses (8 oz or 240 ml) daily.  For example: sports drinks, diluted fruit juices, soft drinks.  Diarrhea Medication - Bismuth Subsalicylate (e.g., Kaopectate, PeptoBismol):  Helps reduce diarrhea, vomiting, and abdominal cramping.  Call Back If:  You become worse.  Patient Will Follow Care Advice:  YES

## 2013-01-19 ENCOUNTER — Other Ambulatory Visit: Payer: BC Managed Care – PPO

## 2013-01-21 ENCOUNTER — Other Ambulatory Visit: Payer: Self-pay | Admitting: Cardiovascular Disease

## 2013-01-22 ENCOUNTER — Ambulatory Visit: Payer: BC Managed Care – PPO | Admitting: Internal Medicine

## 2013-02-13 ENCOUNTER — Other Ambulatory Visit: Payer: BC Managed Care – PPO

## 2013-02-14 ENCOUNTER — Ambulatory Visit: Payer: BC Managed Care – PPO | Admitting: Internal Medicine

## 2013-02-18 ENCOUNTER — Other Ambulatory Visit: Payer: Self-pay | Admitting: Internal Medicine

## 2013-02-22 ENCOUNTER — Other Ambulatory Visit (INDEPENDENT_AMBULATORY_CARE_PROVIDER_SITE_OTHER): Payer: BC Managed Care – PPO

## 2013-02-22 DIAGNOSIS — E785 Hyperlipidemia, unspecified: Secondary | ICD-10-CM

## 2013-02-22 LAB — HEPATIC FUNCTION PANEL
AST: 22 U/L (ref 0–37)
Albumin: 4.1 g/dL (ref 3.5–5.2)
Alkaline Phosphatase: 73 U/L (ref 39–117)
Total Protein: 7.6 g/dL (ref 6.0–8.3)

## 2013-02-22 LAB — LIPID PANEL: VLDL: 20 mg/dL (ref 0.0–40.0)

## 2013-02-23 ENCOUNTER — Encounter: Payer: Self-pay | Admitting: Internal Medicine

## 2013-02-23 ENCOUNTER — Ambulatory Visit (INDEPENDENT_AMBULATORY_CARE_PROVIDER_SITE_OTHER): Payer: BC Managed Care – PPO | Admitting: Internal Medicine

## 2013-02-23 VITALS — BP 136/90 | HR 72 | Temp 98.6°F | Resp 16 | Ht 71.0 in | Wt 230.0 lb

## 2013-02-23 DIAGNOSIS — F32A Depression, unspecified: Secondary | ICD-10-CM

## 2013-02-23 DIAGNOSIS — I10 Essential (primary) hypertension: Secondary | ICD-10-CM

## 2013-02-23 DIAGNOSIS — F329 Major depressive disorder, single episode, unspecified: Secondary | ICD-10-CM

## 2013-02-23 DIAGNOSIS — E785 Hyperlipidemia, unspecified: Secondary | ICD-10-CM

## 2013-02-23 DIAGNOSIS — R51 Headache: Secondary | ICD-10-CM

## 2013-02-23 MED ORDER — EZETIMIBE-SIMVASTATIN 10-20 MG PO TABS: 1.0000 | ORAL_TABLET | Freq: Every day | ORAL | Status: DC

## 2013-02-23 MED ORDER — AMLODIPINE BESYLATE 5 MG PO TABS: 2.5000 mg | ORAL_TABLET | Freq: Two times a day (BID) | ORAL | Status: DC

## 2013-02-23 MED ORDER — BUPROPION HCL ER (XL) 150 MG PO TB24: 150.0000 mg | ORAL_TABLET | Freq: Every day | ORAL | Status: DC

## 2013-02-23 NOTE — Progress Notes (Signed)
Subjective:    Patient ID: Carlos James, male    DOB: Jul 30, 1975, 37 y.o.   MRN: [id number]  HPI  The patient has HTN and lipids as risk factors He takes the diivan in the AM Still smokes/ using oral tobacco  Review of Systems  Constitutional: Negative for fever and fatigue.  HENT: Negative for hearing loss, congestion, neck pain and postnasal drip.   Eyes: Negative for discharge, redness and visual disturbance.  Respiratory: Negative for cough, shortness of breath and wheezing.   Cardiovascular: Negative for leg swelling.  Gastrointestinal: Negative for abdominal pain, constipation and abdominal distention.  Genitourinary: Negative for urgency and frequency.  Musculoskeletal: Negative for joint swelling and arthralgias.  Skin: Negative for color change and rash.  Neurological: Negative for weakness and light-headedness.  Hematological: Negative for adenopathy.  Psychiatric/Behavioral: Negative for behavioral problems.   Past Medical History  Diagnosis Date  . Hypertension   . Hyperlipidemia   . Atypical chest pain     History   Social History  . Marital Status: Married    Spouse Name: N/A    Number of Children: N/A  . Years of Education: N/A   Occupational History  . Not on file.   Social History Main Topics  . Smoking status: Former Research scientist (life sciences)  . Smokeless tobacco: Current User     Comment: uses 1 can a day, was a periodic smoker, but not now.  . Alcohol Use: Yes  . Drug Use: No  . Sexual Activity: Yes   Other Topics Concern  . Not on file   Social History Narrative   Regular exercise          No past surgical history on file.  Family History  Problem Relation Age of Onset  . Coronary artery disease    . Arthritis Mother   . Heart disease Mother   . Hyperlipidemia Mother   . Arthritis Father   . Heart disease Father   . Hyperlipidemia Father     Allergies  Allergen Reactions  . Ciprofloxacin     REACTION: Nausea    Current Outpatient  Prescriptions on File Prior to Visit  Medication Sig Dispense Refill  . ALPRAZolam (XANAX) 0.5 MG tablet Take 1 tablet (0.5 mg total) by mouth at bedtime as needed.  30 tablet  3  . amLODipine (NORVASC) 5 MG tablet TAKE 1 TABLET (5 MG TOTAL) BY MOUTH DAILY.  30 tablet  9  . metoCLOPramide (REGLAN) 10 MG tablet Take 10 mg by mouth daily.       Marland Kitchen NEXIUM 40 MG capsule TAKE 1 CAPSULE TWO TIMES A DAY  60 capsule  6  . valsartan-hydrochlorothiazide (DIOVAN-HCT) 160-12.5 MG per tablet TAKE ONE TABLET BY MOUTH DAILY  30 tablet  3  . rosuvastatin (CRESTOR) 20 MG tablet Take 1 tablet (20 mg total) by mouth daily.  90 tablet  3   No current facility-administered medications on file prior to visit.    BP 136/90  Pulse 72  Temp(Src) 98.6 F (37 C)  Resp 16  Ht 5' 11"  (1.803 m)  Wt 230 lb (104.327 kg)  BMI 32.09 kg/m2        Objective:   Physical Exam  Constitutional: He is oriented to person, place, and time. He appears well-developed and well-nourished.  HENT:  Head: Normocephalic and atraumatic.  Cardiovascular: Regular rhythm and intact distal pulses.   Neurological: He is alert and oriented to person, place, and time.  Skin: Skin is warm and  dry.  Psychiatric: He has a normal mood and affect. His behavior is normal.          Assessment & Plan:  discussion of wellbutrin  Chang the blood pressure medications to 1/2 in the AM and 1/2 in the 1/2 in the PM  Lipid "sore" with the lipid  Weight loss is the key  vytorin twice every third day

## 2013-03-16 ENCOUNTER — Other Ambulatory Visit: Payer: Self-pay | Admitting: Internal Medicine

## 2013-05-15 ENCOUNTER — Telehealth: Payer: Self-pay | Admitting: Internal Medicine

## 2013-05-15 ENCOUNTER — Other Ambulatory Visit: Payer: Self-pay | Admitting: *Deleted

## 2013-05-15 MED ORDER — METOCLOPRAMIDE HCL 10 MG PO TABS: 10.0000 mg | ORAL_TABLET | Freq: Two times a day (BID) | ORAL | Status: DC

## 2013-05-15 NOTE — Telephone Encounter (Signed)
Pt has switch to new pharm [facility] [id number]. Pt would like metoclopramide 10 mg twice a day #180 with refills

## 2013-05-15 NOTE — Telephone Encounter (Signed)
done

## 2013-07-12 ENCOUNTER — Other Ambulatory Visit (INDEPENDENT_AMBULATORY_CARE_PROVIDER_SITE_OTHER): Payer: BC Managed Care – PPO

## 2013-07-12 DIAGNOSIS — E785 Hyperlipidemia, unspecified: Secondary | ICD-10-CM

## 2013-07-12 LAB — LIPID PANEL
CHOL/HDL RATIO: 5
Cholesterol: 251 mg/dL — ABNORMAL HIGH (ref 0–200)
HDL: 51.4 mg/dL (ref >39.00)
TRIGLYCERIDES: 115 mg/dL (ref 0.0–149.0)
VLDL: 23 mg/dL (ref 0.0–40.0)

## 2013-07-12 LAB — HEPATIC FUNCTION PANEL
ALT: 50 U/L (ref 0–53)
AST: 26 U/L (ref 0–37)
Albumin: 4.3 g/dL (ref 3.5–5.2)
Alkaline Phosphatase: 83 U/L (ref 39–117)
BILIRUBIN DIRECT: 0 mg/dL (ref 0.0–0.3)
BILIRUBIN TOTAL: 0.6 mg/dL (ref 0.3–1.2)
Total Protein: 7.7 g/dL (ref 6.0–8.3)

## 2013-07-12 LAB — LDL CHOLESTEROL, DIRECT: LDL DIRECT: 176.7 mg/dL

## 2013-07-13 ENCOUNTER — Other Ambulatory Visit: Payer: BC Managed Care – PPO

## 2013-07-16 ENCOUNTER — Encounter: Payer: Self-pay | Admitting: Internal Medicine

## 2013-07-16 ENCOUNTER — Ambulatory Visit (INDEPENDENT_AMBULATORY_CARE_PROVIDER_SITE_OTHER): Payer: BC Managed Care – PPO | Admitting: Internal Medicine

## 2013-07-16 VITALS — BP 124/78 | HR 80 | Temp 98.2°F | Resp 16 | Ht 71.0 in | Wt 234.0 lb

## 2013-07-16 DIAGNOSIS — F4323 Adjustment disorder with mixed anxiety and depressed mood: Secondary | ICD-10-CM

## 2013-07-16 DIAGNOSIS — I10 Essential (primary) hypertension: Secondary | ICD-10-CM

## 2013-07-16 DIAGNOSIS — E785 Hyperlipidemia, unspecified: Secondary | ICD-10-CM

## 2013-07-16 MED ORDER — EZETIMIBE-SIMVASTATIN 10-20 MG PO TABS: 0.5000 | ORAL_TABLET | Freq: Every day | ORAL | Status: DC

## 2013-07-16 NOTE — Patient Instructions (Signed)
The patient is instructed to continue all medications as prescribed. Schedule followup with check out clerk upon leaving the clinic  

## 2013-07-16 NOTE — Progress Notes (Signed)
[facility] unavailable this am

## 2013-07-16 NOTE — Progress Notes (Signed)
Subjective:    Patient ID: Carlos James, male    DOB: Oct 20, 1975, 38 y.o.   MRN: [id number]  HPI  Lipid profile is worse and he admits to missing dates His blood pressure is elevated at home but not at the office He has days that he does not feels good Discussion of use of eliptical Weight gain Not having any significant side effects from the medications   Review of Systems  Constitutional: Negative for fever and fatigue.  HENT: Negative for congestion, hearing loss and postnasal drip.   Eyes: Negative for discharge, redness and visual disturbance.  Respiratory: Negative for cough, shortness of breath and wheezing.   Cardiovascular: Negative for leg swelling.  Gastrointestinal: Negative for abdominal pain, constipation and abdominal distention.  Genitourinary: Negative for urgency and frequency.  Musculoskeletal: Negative for arthralgias, joint swelling and neck pain.  Skin: Negative for color change and rash.  Neurological: Negative for weakness and light-headedness.  Hematological: Negative for adenopathy.  Psychiatric/Behavioral: Negative for behavioral problems.   Past Medical History  Diagnosis Date  . Hypertension   . Hyperlipidemia   . Atypical chest pain     History   Social History  . Marital Status: Married    Spouse Name: N/A    Number of Children: N/A  . Years of Education: N/A   Occupational History  . Not on file.   Social History Main Topics  . Smoking status: Former Research scientist (life sciences)  . Smokeless tobacco: Current User     Comment: uses 1 can a day, was a periodic smoker, but not now.  . Alcohol Use: Yes  . Drug Use: No  . Sexual Activity: Yes   Other Topics Concern  . Not on file   Social History Narrative   Regular exercise          History reviewed. No pertinent past surgical history.  Family History  Problem Relation Age of Onset  . Coronary artery disease    . Arthritis Mother   . Heart disease Mother   . Hyperlipidemia Mother   .  Arthritis Father   . Heart disease Father   . Hyperlipidemia Father     Allergies  Allergen Reactions  . Ciprofloxacin     REACTION: Nausea    Current Outpatient Prescriptions on File Prior to Visit  Medication Sig Dispense Refill  . ALPRAZolam (XANAX) 0.5 MG tablet Take 1 tablet (0.5 mg total) by mouth at bedtime as needed.  30 tablet  3  . amLODipine (NORVASC) 5 MG tablet Take 0.5 tablets (2.5 mg total) by mouth 2 (two) times daily.  30 tablet  9  . buPROPion (WELLBUTRIN XL) 150 MG 24 hr tablet Take 1 tablet (150 mg total) by mouth daily.  30 tablet  4  . ezetimibe-simvastatin (VYTORIN) 10-20 MG per tablet Take 1 tablet by mouth at bedtime. Every third day      . metoCLOPramide (REGLAN) 10 MG tablet Take 1 tablet (10 mg total) by mouth 2 (two) times daily.  180 tablet  1  . NEXIUM 40 MG capsule TAKE 1 CAPSULE TWO TIMES A DAY  60 capsule  11  . valsartan-hydrochlorothiazide (DIOVAN-HCT) 160-12.5 MG per tablet TAKE ONE TABLET BY MOUTH DAILY  30 tablet  11   No current facility-administered medications on file prior to visit.    BP 124/78  Pulse 80  Temp(Src) 98.2 F (36.8 C)  Resp 16  Ht 5' 11"  (1.803 m)  Wt 234 lb (106.142 kg)  BMI  32.65 kg/m2       Objective:   Physical Exam  Nursing note and vitals reviewed. Constitutional: He appears well-developed and well-nourished.  HENT:  Head: Normocephalic and atraumatic.  Eyes: Conjunctivae are normal. Pupils are equal, round, and reactive to light.  Neck: Normal range of motion. Neck supple.  Cardiovascular: Normal rate and regular rhythm.   Pulmonary/Chest: Effort normal and breath sounds normal.  Abdominal: Soft. Bowel sounds are normal.          Assessment & Plan:  1/2 tablet daily Stable on blood pressure Discussed blood pressure and monitoring Salt in diet

## 2013-08-07 ENCOUNTER — Telehealth: Payer: Self-pay | Admitting: Internal Medicine

## 2013-08-07 NOTE — Telephone Encounter (Signed)
Relevant patient education mailed to patient.  

## 2013-08-29 ENCOUNTER — Other Ambulatory Visit: Payer: Self-pay | Admitting: *Deleted

## 2013-08-29 ENCOUNTER — Telehealth: Payer: Self-pay | Admitting: Internal Medicine

## 2013-08-29 MED ORDER — METOCLOPRAMIDE HCL 10 MG PO TABS: 10.0000 mg | ORAL_TABLET | Freq: Two times a day (BID) | ORAL | Status: DC

## 2013-08-29 NOTE — Telephone Encounter (Signed)
done

## 2013-08-29 NOTE — Telephone Encounter (Signed)
[facility] requesting refill of metoCLOPramide (REGLAN) 10 MG tablet

## 2013-09-26 ENCOUNTER — Telehealth: Payer: Self-pay | Admitting: Internal Medicine

## 2013-09-26 NOTE — Telephone Encounter (Signed)
[place], [place] - [address] is requesting re-fill on NEXIUM 40 MG capsule

## 2013-09-27 MED ORDER — ESOMEPRAZOLE MAGNESIUM 40 MG PO CPDR: DELAYED_RELEASE_CAPSULE | ORAL | Status: DC

## 2013-09-27 NOTE — Telephone Encounter (Signed)
Rx sent to pharmacy   

## 2013-12-25 ENCOUNTER — Telehealth: Payer: Self-pay | Admitting: Internal Medicine

## 2013-12-25 DIAGNOSIS — I10 Essential (primary) hypertension: Secondary | ICD-10-CM

## 2013-12-25 MED ORDER — AMLODIPINE BESYLATE 5 MG PO TABS: 2.5000 mg | ORAL_TABLET | Freq: Two times a day (BID) | ORAL | Status: DC

## 2013-12-25 NOTE — Telephone Encounter (Signed)
[facility] [place] - [address] is requesting re-fill on amLODipine (NORVASC) 5 MG tablet

## 2013-12-25 NOTE — Telephone Encounter (Signed)
Rx sent to pharmacy   

## 2014-01-23 ENCOUNTER — Other Ambulatory Visit: Payer: BC Managed Care – PPO

## 2014-01-23 ENCOUNTER — Emergency Department (HOSPITAL_BASED_OUTPATIENT_CLINIC_OR_DEPARTMENT_OTHER): Payer: BC Managed Care – PPO

## 2014-01-23 ENCOUNTER — Emergency Department (HOSPITAL_BASED_OUTPATIENT_CLINIC_OR_DEPARTMENT_OTHER)
Admission: EM | Admit: 2014-01-23 | Discharge: 2014-01-23 | Disposition: A | Discharge status: Home / Self Care | Payer: BC Managed Care – PPO | Attending: Emergency Medicine | Admitting: Emergency Medicine

## 2014-01-23 ENCOUNTER — Encounter (HOSPITAL_BASED_OUTPATIENT_CLINIC_OR_DEPARTMENT_OTHER): Payer: Self-pay | Admitting: Emergency Medicine

## 2014-01-23 DIAGNOSIS — I10 Essential (primary) hypertension: Secondary | ICD-10-CM | POA: Insufficient documentation

## 2014-01-23 DIAGNOSIS — Y929 Unspecified place or not applicable: Secondary | ICD-10-CM | POA: Insufficient documentation

## 2014-01-23 DIAGNOSIS — W230XXA Caught, crushed, jammed, or pinched between moving objects, initial encounter: Secondary | ICD-10-CM | POA: Insufficient documentation

## 2014-01-23 DIAGNOSIS — Z79899 Other long term (current) drug therapy: Secondary | ICD-10-CM | POA: Insufficient documentation

## 2014-01-23 DIAGNOSIS — S61219A Laceration without foreign body of unspecified finger without damage to nail, initial encounter: Secondary | ICD-10-CM

## 2014-01-23 DIAGNOSIS — S61209A Unspecified open wound of unspecified finger without damage to nail, initial encounter: Secondary | ICD-10-CM | POA: Insufficient documentation

## 2014-01-23 DIAGNOSIS — Y9389 Activity, other specified: Secondary | ICD-10-CM | POA: Insufficient documentation

## 2014-01-23 DIAGNOSIS — Z87891 Personal history of nicotine dependence: Secondary | ICD-10-CM | POA: Insufficient documentation

## 2014-01-23 DIAGNOSIS — E785 Hyperlipidemia, unspecified: Secondary | ICD-10-CM | POA: Insufficient documentation

## 2014-01-23 NOTE — ED Provider Notes (Signed)
CSN: [id number]     Arrival date & time 01/23/14  2042 History   This chart was scribed for Carlos Shipper, MD, by Neta Ehlers, ED Scribe. This patient was seen in room MH10/MH10 and the patient's care was started at 10:16 PM.  First MD Initiated Contact with Patient 01/23/14 2208     Chief Complaint  Patient presents with  . Laceration    Patient is a 38 y.o. male presenting with skin laceration. The history is provided by the patient. No language interpreter was used.  Laceration Location:  Finger Finger laceration location:  R little finger Bleeding: controlled with pressure   Time since incident:  2 hours Laceration mechanism:  Metal edge Pain details:    Severity:  Moderate   Timing:  Constant   Progression:  Unchanged Foreign body present:  No foreign bodies Relieved by:  Pressure Worsened by:  Nothing tried Tetanus status:  Up to date  HPI Comments: Carlos James is a 38 y.o. male who presents to the Emergency Department complaining of a laceration to the pad of his right fifth finger which occurred approximately two points ago when his finger was crushed between a piece of metal equipment weighing approximately 200 pounds and a truck bed. The pt reports pain to the finger. Mr. Paci is right-hand dominant. His tetanus is up-to-date.   Past Medical History  Diagnosis Date  . Hypertension   . Hyperlipidemia   . Atypical chest pain    History reviewed. No pertinent past surgical history. Family History  Problem Relation Age of Onset  . Coronary artery disease    . Arthritis Mother   . Heart disease Mother   . Hyperlipidemia Mother   . Arthritis Father   . Heart disease Father   . Hyperlipidemia Father    History  Substance Use Topics  . Smoking status: Former Research scientist (life sciences)  . Smokeless tobacco: Current User     Comment: uses 1 can a day, was a periodic smoker, but not now.  . Alcohol Use: Yes    Review of Systems  Skin: Positive for wound.  All other  systems reviewed and are negative.   Allergies  Ciprofloxacin  Home Medications   Prior to Admission medications   Medication Sig Start Date End Date Taking? Authorizing Provider  ALPRAZolam Duanne Moron) 0.5 MG tablet Take 1 tablet (0.5 mg total) by mouth at bedtime as needed. 07/20/11   Ricard Dillon, MD  amLODipine (NORVASC) 5 MG tablet Take 0.5 tablets (2.5 mg total) by mouth 2 (two) times daily. 12/25/13   Ricard Dillon, MD  buPROPion (WELLBUTRIN XL) 150 MG 24 hr tablet Take 1 tablet (150 mg total) by mouth daily. 02/23/13   Ricard Dillon, MD  esomeprazole (NEXIUM) 40 MG capsule TAKE 1 CAPSULE TWO TIMES A DAY 09/27/13   Ricard Dillon, MD  ezetimibe-simvastatin (VYTORIN) 10-20 MG per tablet Take 0.5 tablets by mouth at bedtime. 07/16/13   Ricard Dillon, MD  metoCLOPramide (REGLAN) 10 MG tablet Take 1 tablet (10 mg total) by mouth 2 (two) times daily. 08/29/13   Ricard Dillon, MD  valsartan-hydrochlorothiazide (DIOVAN-HCT) 160-12.5 MG per tablet TAKE ONE TABLET BY MOUTH DAILY 03/16/13   Ricard Dillon, MD   Triage Vitals:  Ht 5' 10"  (1.778 m)  Wt 225 lb (102.059 kg)  BMI 32.28 kg/m2  Physical Exam  Nursing note and vitals reviewed. Constitutional: He is oriented to person, place, and time. He appears well-developed and well-nourished.  No distress.  HENT:  Head: Normocephalic and atraumatic.  Eyes: Conjunctivae and EOM are normal.  Neck: Neck supple. No tracheal deviation present.  Cardiovascular: Normal rate.   Pulmonary/Chest: Effort normal. No respiratory distress.  Musculoskeletal: Normal range of motion.  Neurological: He is alert and oriented to person, place, and time.  Skin: Skin is warm and dry.  Psychiatric: He has a normal mood and affect. His behavior is normal.    ED Course  Procedures (including critical care time)  DIAGNOSTIC STUDIES:   COORDINATION OF CARE:  10:20 PM- Discussed treatment plan with patient, and the patient agreed to the plan. The plan includes  suturing.   LACERATION REPAIR PROCEDURE NOTE The patient's identification was confirmed and consent was obtained. This procedure was performed by Carlos Shipper, MD at 10:20 PM. Site: Pad of right fifth finger  Sterile procedures observed Anesthetic used (type and amt): none Suture type/size:5-0 prolene Length: 1.5 cm # of Sutures: 3 Technique: simple interrupted Complexity - simple Antibx ointment applied Tetanus UTD  Site anesthetized, irrigated with NS, explored without evidence of foreign body, wound well approximated, site covered with dry, sterile dressing.  Patient tolerated procedure well without complications. Instructions for care discussed verbally and patient provided with additional written instructions for homecare and f/u.   Labs Review Labs Reviewed - No data to display  Imaging Review Dg Finger Little Right  01/23/2014   CLINICAL DATA:  Right little finger injury, pain.  EXAM: RIGHT LITTLE FINGER 2+V  COMPARISON:  None.  FINDINGS: No fracture, dislocation or radiopaque foreign body is identified. Soft tissue swelling distally and likely laceration on the volar aspect of the distal right little finger is noted.  IMPRESSION: Negative for fracture or foreign body.  Soft tissue swelling.   Electronically Signed   By: Inge Rise M.D.   On: 01/23/2014 21:12     EKG Interpretation None      MDM   Final diagnoses:  Finger laceration, initial encounter    Small 1.5 cm lac to R pinky finger at pad. Crush injury. Xray ok. Deferred numbing, 3 sutures placed without lidocaine. Patient tolerated well without any complications. Stable for discharge.  I personally performed the services described in this documentation, which was scribed in my presence. The recorded information has been reviewed and is accurate.     Carlos Shipper, MD 01/23/14 [phone]

## 2014-01-23 NOTE — Discharge Instructions (Signed)

## 2014-01-23 NOTE — ED Notes (Signed)
MD at bedside suturing lac

## 2014-01-23 NOTE — ED Notes (Signed)
Pt c/o right 5th finger injury crushed between 2 metal objects x 1 hr ago

## 2014-01-28 ENCOUNTER — Encounter: Payer: BC Managed Care – PPO | Admitting: Internal Medicine

## 2014-02-20 ENCOUNTER — Telehealth: Payer: Self-pay | Admitting: Internal Medicine

## 2014-02-20 MED ORDER — VALSARTAN-HYDROCHLOROTHIAZIDE 160-12.5 MG PO TABS: 1.0000 | ORAL_TABLET | Freq: Every day | ORAL | Status: DC

## 2014-02-20 NOTE — Telephone Encounter (Signed)
[place], [place] - [address] is requesting re-fill on valsartan-hydrochlorothiazide (DIOVAN-HCT) 160-12.5 MG per tablet Pt is scheduled to est with Dr. Yong Channel on 03/15/14

## 2014-02-20 NOTE — Telephone Encounter (Signed)
Medication sent in. 

## 2014-02-28 ENCOUNTER — Encounter: Payer: Self-pay | Admitting: Family Medicine

## 2014-02-28 ENCOUNTER — Ambulatory Visit (INDEPENDENT_AMBULATORY_CARE_PROVIDER_SITE_OTHER): Payer: BC Managed Care – PPO | Admitting: Family Medicine

## 2014-02-28 VITALS — BP 136/92 | HR 67 | Temp 98.3°F | Resp 18 | Ht 71.0 in | Wt 229.0 lb

## 2014-02-28 DIAGNOSIS — E785 Hyperlipidemia, unspecified: Secondary | ICD-10-CM

## 2014-02-28 DIAGNOSIS — K219 Gastro-esophageal reflux disease without esophagitis: Secondary | ICD-10-CM

## 2014-02-28 DIAGNOSIS — E669 Obesity, unspecified: Secondary | ICD-10-CM | POA: Insufficient documentation

## 2014-02-28 DIAGNOSIS — I10 Essential (primary) hypertension: Secondary | ICD-10-CM

## 2014-02-28 LAB — COMPREHENSIVE METABOLIC PANEL
ALBUMIN: 4.4 g/dL (ref 3.5–5.2)
ALT: 38 U/L (ref 0–53)
AST: 22 U/L (ref 0–37)
Alkaline Phosphatase: 82 U/L (ref 39–117)
BUN: 15 mg/dL (ref 6–23)
CALCIUM: 9.6 mg/dL (ref 8.4–10.5)
CHLORIDE: 106 meq/L (ref 96–112)
CO2: 25 meq/L (ref 19–32)
Creatinine, Ser: 0.8 mg/dL (ref 0.4–1.5)
GFR: 119.71 mL/min (ref >60.00)
Glucose, Bld: 95 mg/dL (ref 70–99)
Potassium: 4 mEq/L (ref 3.5–5.1)
SODIUM: 137 meq/L (ref 135–145)
TOTAL PROTEIN: 7.9 g/dL (ref 6.0–8.3)
Total Bilirubin: 0.8 mg/dL (ref 0.2–1.2)

## 2014-02-28 LAB — TSH: TSH: 2.48 u[IU]/mL (ref 0.35–4.50)

## 2014-02-28 LAB — LIPID PANEL
CHOLESTEROL: 244 mg/dL — AB (ref 0–200)
HDL: 48.6 mg/dL (ref >39.00)
LDL Cholesterol: 165 mg/dL — ABNORMAL HIGH (ref 0–99)
NonHDL: 195.4
TRIGLYCERIDES: 150 mg/dL — AB (ref 0.0–149.0)
Total CHOL/HDL Ratio: 5
VLDL: 30 mg/dL (ref 0.0–40.0)

## 2014-02-28 NOTE — Assessment & Plan Note (Signed)
Encouraged TLC. He says his wt stays pretty stable. TSH check today.

## 2014-02-28 NOTE — Progress Notes (Signed)
Pre visit review using our clinic review tool, if applicable. No additional management support is needed unless otherwise documented below in the visit note. 

## 2014-02-28 NOTE — Assessment & Plan Note (Signed)
Was partially noncompliant with meds at last f/u 07/2013 with Dr. Arnoldo Morale. Will check FLP today: he takes 1/2 of vytorin 10-20 qd. Hepatic panel today.

## 2014-02-28 NOTE — Assessment & Plan Note (Signed)
Sounds like he may not be quite at goal, but we need more measurements to go on before making med change/addition. Check bp and HR at work 3 X /week and bring these numbers in for review in 1 mo. Cr/lytes today.

## 2014-02-28 NOTE — Assessment & Plan Note (Signed)
Stable on PPI and reglan.

## 2014-02-28 NOTE — Progress Notes (Signed)
Office Note 02/28/2014  CC:  Chief Complaint  Patient presents with  . Establish Care    blood pressure   Goes by Carlos James. HPI:  Carlos James is a 38 y.o. White male who is here to establish care. Patient's most recent primary MD: Dr. Benay James (retiring from clinical practice). Old records in EPIC/[facility] EMR were reviewed prior to or during today's visit.  He is fasting for bloodwork today. He feels good with keeping up with q78mooffice visits and labs b/c of his family history and med probs.  He dips.  No smoking.  Alcohol intake: 3 beers a day on his days off.  Past Medical History  Diagnosis Date  . Hypertension   . Hyperlipidemia   . Atypical chest pain since 2007    w/u has included a CT angio chest (neg), myoview (normal), EGD  . GERD (gastroesophageal reflux disease)   . Insomnia   . Delayed gastric emptying     Past Surgical History  Procedure Laterality Date  . Cardiovascular stress test  07/2011    Normal, excellent exercise capacity  . Gastric emptying scan  2007    Delayed--reglan started    Family History  Problem Relation Age of Onset  . Coronary artery disease    . Arthritis Mother   . Heart disease Mother   . Hyperlipidemia Mother   . Arthritis Father   . Heart disease Father   . Hyperlipidemia Father     History   Social History  . Marital Status: Married    Spouse Name: N/A    Number of Children: N/A  . Years of Education: N/A   Occupational History  . Not on file.   Social History Main Topics  . Smoking status: Former SResearch scientist (life sciences) . Smokeless tobacco: Current User     Comment: uses 1 can a day, was a periodic smoker, but not now.  . Alcohol Use: Yes  . Drug Use: No  . Sexual Activity: Yes   Other Topics Concern  . Not on file   Social History Narrative   Married, 2 chldren (39y/o and 39 y/o).   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   [place] from [place]      Regular exercise: at  [employer]   He dips.  No smoking.  Alcohol intake: 3 beers a day on his days off.                Outpatient Encounter Prescriptions as of 02/28/2014  Medication Sig  . ALPRAZolam (XANAX) 0.5 MG tablet Take 1 tablet (0.5 mg total) by mouth at bedtime as needed.  .Marland KitchenamLODipine (NORVASC) 5 MG tablet Take 0.5 tablets (2.5 mg total) by mouth 2 (two) times daily.  .Marland Kitchenesomeprazole (NEXIUM) 40 MG capsule TAKE 1 CAPSULE TWO TIMES A DAY  . ezetimibe-simvastatin (VYTORIN) 10-20 MG per tablet Take 0.5 tablets by mouth at bedtime.  . metoCLOPramide (REGLAN) 10 MG tablet Take 1 tablet (10 mg total) by mouth 2 (two) times daily.  . valsartan-hydrochlorothiazide (DIOVAN-HCT) 160-12.5 MG per tablet Take 1 tablet by mouth daily.  .Marland KitchenbuPROPion (WELLBUTRIN XL) 150 MG 24 hr tablet Take 1 tablet (150 mg total) by mouth daily.    Allergies  Allergen Reactions  . Ciprofloxacin     REACTION: Nausea    ROS Review of Systems  Constitutional: Negative for fever and fatigue.  HENT: Negative for congestion and sore throat.   Eyes: Negative  for visual disturbance.  Respiratory: Negative for cough.   Cardiovascular: Negative for chest pain.  Gastrointestinal: Negative for nausea and abdominal pain.  Genitourinary: Negative for dysuria.  Musculoskeletal: Negative for back pain and joint swelling.  Skin: Negative for rash.  Neurological: Negative for weakness and headaches.  Hematological: Negative for adenopathy.    PE; Blood pressure 136/92, pulse 67, temperature 98.3 F (36.8 C), temperature source Oral, resp. rate 18, height 5' 11"  (1.803 m), weight 229 lb (103.874 kg), SpO2 97.00%. Gen: Alert, well appearing.  Patient is oriented to person, place, time, and situation. BJY:NWGN: no injection, icteris, swelling, or exudate.  EOMI, PERRLA. Mouth: lips without lesion/swelling.  Oral mucosa pink and moist. Oropharynx without erythema, exudate, or swelling.  Neck - No masses or thyromegaly or limitation in  range of motion CV: RRR, no m/r/g.   LUNGS: CTA bilat, nonlabored resps, good aeration in all lung fields. EXT: no clubbing, cyanosis, or edema.   Pertinent labs:  None today Last labs in 07/2013: Lab Results  Component Value Date   CHOL 251* 07/12/2013   HDL 51.40 07/12/2013   LDLCALC 96 05/12/2011   LDLDIRECT 176.7 07/12/2013   TRIG 115.0 07/12/2013   CHOLHDL 5 07/12/2013     Chemistry      Component Value Date/Time   NA 140 02/21/2012 0801   K 3.7 02/21/2012 0801   CL 105 02/21/2012 0801   CO2 28 02/21/2012 0801   BUN 16 02/21/2012 0801   CREATININE 0.8 02/21/2012 0801      Component Value Date/Time   CALCIUM 8.9 02/21/2012 0801   ALKPHOS 83 07/12/2013 0801   AST 26 07/12/2013 0801   ALT 50 07/12/2013 0801   BILITOT 0.6 07/12/2013 0801     ASSESSMENT AND PLAN:   Transfer pt:  HYPERTENSION Sounds like he may not be quite at goal, but we need more measurements to go on before making med change/addition. Check bp and HR at work 3 X /week and bring these numbers in for review in 1 mo. Cr/lytes today.  HYPERLIPIDEMIA Was partially noncompliant with meds at last f/u 07/2013 with Dr. Arnoldo James. Will check FLP today: he takes 1/2 of vytorin 10-20 qd. Hepatic panel today.  GERD Stable on PPI and reglan.  Obesity, Class I, BMI 30-34.9 Encouraged TLC. He says his wt stays pretty stable. TSH check today.  An After Visit Summary was printed and given to the patient.  Return in about 4 weeks (around 03/28/2014) for f/u HTN.

## 2014-03-12 ENCOUNTER — Other Ambulatory Visit: Payer: BC Managed Care – PPO

## 2014-03-15 ENCOUNTER — Ambulatory Visit: Payer: BC Managed Care – PPO | Admitting: Family Medicine

## 2014-03-21 ENCOUNTER — Ambulatory Visit: Payer: BC Managed Care – PPO | Admitting: Family Medicine

## 2014-04-01 ENCOUNTER — Telehealth: Payer: Self-pay | Admitting: Family Medicine

## 2014-04-01 DIAGNOSIS — I10 Essential (primary) hypertension: Secondary | ICD-10-CM

## 2014-04-01 MED ORDER — AMLODIPINE BESYLATE 5 MG PO TABS: 2.5000 mg | ORAL_TABLET | Freq: Two times a day (BID) | ORAL | Status: DC

## 2014-04-01 MED ORDER — VALSARTAN-HYDROCHLOROTHIAZIDE 160-12.5 MG PO TABS: 1.0000 | ORAL_TABLET | Freq: Every day | ORAL | Status: DC

## 2014-04-01 NOTE — Telephone Encounter (Signed)
Rx's sent to [facility] for 90 day supply x 1 rf.

## 2014-04-01 NOTE — Telephone Encounter (Signed)
90 day supply [facility]

## 2014-04-05 ENCOUNTER — Ambulatory Visit (INDEPENDENT_AMBULATORY_CARE_PROVIDER_SITE_OTHER): Payer: BC Managed Care – PPO | Admitting: Family Medicine

## 2014-04-05 ENCOUNTER — Encounter: Payer: Self-pay | Admitting: Family Medicine

## 2014-04-05 VITALS — BP 121/85 | HR 95 | Temp 98.4°F | Resp 18 | Ht 71.0 in | Wt 236.0 lb

## 2014-04-05 DIAGNOSIS — E785 Hyperlipidemia, unspecified: Secondary | ICD-10-CM

## 2014-04-05 DIAGNOSIS — I10 Essential (primary) hypertension: Secondary | ICD-10-CM

## 2014-04-05 MED ORDER — PHENTERMINE HCL 37.5 MG PO CAPS: 37.5000 mg | ORAL_CAPSULE | ORAL | Status: DC

## 2014-04-05 MED ORDER — ATORVASTATIN CALCIUM 10 MG PO TABS: 10.0000 mg | ORAL_TABLET | Freq: Every day | ORAL | Status: DC

## 2014-04-05 NOTE — Progress Notes (Signed)
OFFICE NOTE  04/05/2014  CC:  Chief Complaint  Patient presents with  . Follow-up   HPI: Patient is a 38 y.o. Caucasian male who is here for 1 mo f/u HTN and hyperlipidemia. Last visit bp a little up.  I asked him to do home monitoring before we changed anything.  His lipids were also up and I recommended he take a whole vytorin tab once a day instead of 1/2 tab and he responded by saying that taking a whole tab makes him have muscle aches.  Reviewed bps today: avg systolic 297L-892J, avg diast 80s, occ reading in 90s.  No HR numbers.  Past lipid meds: crestor= body aches.  Vytorin= body aches on 1/2 10-20 tab. Lovalo=body aches, was on this < 1 mo.  Simvastatin=body aches. Has not tried lipitor, pravachol.   Pertinent PMH:  Past medical, surgical, social, and family history reviewed and no changes are noted since last office visit.  MEDS: Takes amlodipine 74m po qhs Outpatient Prescriptions Prior to Visit  Medication Sig Dispense Refill  . ALPRAZolam (XANAX) 0.5 MG tablet Take 1 tablet (0.5 mg total) by mouth at bedtime as needed.  30 tablet  3  . amLODipine (NORVASC) 5 MG tablet Take 0.5 tablets (2.5 mg total) by mouth 2 (two) times daily.  180 tablet  1  . esomeprazole (NEXIUM) 40 MG capsule TAKE 1 CAPSULE TWO TIMES A DAY  60 capsule  11  . metoCLOPramide (REGLAN) 10 MG tablet Take 1 tablet (10 mg total) by mouth 2 (two) times daily.  180 tablet  1  . valsartan-hydrochlorothiazide (DIOVAN-HCT) 160-12.5 MG per tablet Take 1 tablet by mouth daily.  90 tablet  1  . buPROPion (WELLBUTRIN XL) 150 MG 24 hr tablet Take 1 tablet (150 mg total) by mouth daily.  30 tablet  4  . ezetimibe-simvastatin (VYTORIN) 10-20 MG per tablet Take 0.5 tablets by mouth at bedtime.       No facility-administered medications prior to visit.    PE: Blood pressure 121/85, pulse 95, temperature 98.4 F (36.9 C), temperature source Temporal, resp. rate 18, height 5' 11"  (1.803 m), weight 236 lb (107.049  kg), SpO2 96.00%. Gen: Alert, well appearing.  Patient is oriented to person, place, time, and situation. No further exam today.  LABS: none Recent:   Chemistry      Component Value Date/Time   NA 137 02/28/2014 0907   K 4.0 02/28/2014 0907   CL 106 02/28/2014 0907   CO2 25 02/28/2014 0907   BUN 15 02/28/2014 0907   CREATININE 0.8 02/28/2014 0907      Component Value Date/Time   CALCIUM 9.6 02/28/2014 0907   ALKPHOS 82 02/28/2014 0907   AST 22 02/28/2014 0907   ALT 38 02/28/2014 0907   BILITOT 0.8 02/28/2014 0907     Lab Results  Component Value Date   CHOL 244* 02/28/2014   HDL 48.60 02/28/2014   LDLCALC 165* 02/28/2014   LDLDIRECT 176.7 07/12/2013   TRIG 150.0* 02/28/2014   CHOLHDL 5 02/28/2014     IMPRESSION AND PLAN:  1) HTN; The current medical regimen is effective;  continue present plan and medications.  2) Hyperlipidemia: intolerant to simvastatin, crestor, and lovalo. Will do trial of low dose atorvastatin.  Start 139matorvastatin qd.  Call in 1 mo with side effect report. If he tolerates the med we'll plan on next FLP in 6 mo.  An After Visit Summary was printed and given to the patient.  FOLLOW UP:  6 mo

## 2014-04-05 NOTE — Progress Notes (Signed)
Pre visit review using our clinic review tool, if applicable. No additional management support is needed unless otherwise documented below in the visit note. 

## 2014-05-22 ENCOUNTER — Other Ambulatory Visit: Payer: Self-pay | Admitting: Family Medicine

## 2014-05-22 MED ORDER — ESOMEPRAZOLE MAGNESIUM 40 MG PO CPDR: DELAYED_RELEASE_CAPSULE | ORAL | Status: DC

## 2014-05-22 NOTE — Telephone Encounter (Signed)
Pt's wife [facility] requesting 90 day supply of pt's nexium.  Rx sent to [facility].

## 2014-06-06 ENCOUNTER — Other Ambulatory Visit: Payer: Self-pay | Admitting: Family Medicine

## 2014-06-06 MED ORDER — METOCLOPRAMIDE HCL 10 MG PO TABS: 10.0000 mg | ORAL_TABLET | Freq: Two times a day (BID) | ORAL | Status: DC

## 2014-07-05 DIAGNOSIS — R7301 Impaired fasting glucose: Secondary | ICD-10-CM

## 2014-07-05 HISTORY — DX: Impaired fasting glucose: R73.01

## 2014-08-23 ENCOUNTER — Other Ambulatory Visit: Payer: Self-pay | Admitting: Family Medicine

## 2014-11-21 ENCOUNTER — Other Ambulatory Visit: Payer: Self-pay | Admitting: Family Medicine

## 2014-11-26 ENCOUNTER — Telehealth: Payer: Self-pay | Admitting: Family Medicine

## 2014-11-26 MED ORDER — VALSARTAN-HYDROCHLOROTHIAZIDE 160-12.5 MG PO TABS: 1.0000 | ORAL_TABLET | Freq: Every day | ORAL | Status: DC

## 2014-11-26 NOTE — Telephone Encounter (Signed)
LOV: 04/05/14, up coming ov on 01/02/15, last written: 08/23/14 for #90 w/ 1RF. Rx [facility] to [facility]) for #90 w/ 0Rf.

## 2014-11-26 NOTE — Telephone Encounter (Signed)
Pt.has changed insurances and needs a new RX for his Valsartin Hydrochlor. The new insurance requests 70 daysupply, and they would like to [facility] their pharmacy [facility] . Cassandra has an appt scheduled for June 30 for a CPE and blood work.

## 2015-01-02 ENCOUNTER — Ambulatory Visit (INDEPENDENT_AMBULATORY_CARE_PROVIDER_SITE_OTHER): Payer: 59 | Admitting: Family Medicine

## 2015-01-02 ENCOUNTER — Encounter: Payer: Self-pay | Admitting: Family Medicine

## 2015-01-02 VITALS — BP 122/64 | HR 80 | Temp 97.8°F | Resp 12 | Ht 70.0 in | Wt 227.1 lb

## 2015-01-02 DIAGNOSIS — Z0000 Encounter for general adult medical examination without abnormal findings: Secondary | ICD-10-CM

## 2015-01-02 DIAGNOSIS — I10 Essential (primary) hypertension: Secondary | ICD-10-CM | POA: Diagnosis not present

## 2015-01-02 LAB — COMPREHENSIVE METABOLIC PANEL
ALT: 44 U/L (ref 0–53)
AST: 29 U/L (ref 0–37)
Albumin: 4.7 g/dL (ref 3.5–5.2)
Alkaline Phosphatase: 92 U/L (ref 39–117)
BILIRUBIN TOTAL: 1 mg/dL (ref 0.2–1.2)
BUN: 14 mg/dL (ref 6–23)
CHLORIDE: 102 meq/L (ref 96–112)
CO2: 30 meq/L (ref 19–32)
CREATININE: 0.89 mg/dL (ref 0.40–1.50)
Calcium: 9.7 mg/dL (ref 8.4–10.5)
GFR: 100.84 mL/min (ref >60.00)
GLUCOSE: 108 mg/dL — AB (ref 70–99)
Potassium: 4.2 mEq/L (ref 3.5–5.1)
Sodium: 139 mEq/L (ref 135–145)
Total Protein: 7.9 g/dL (ref 6.0–8.3)

## 2015-01-02 LAB — LIPID PANEL
CHOL/HDL RATIO: 4
Cholesterol: 244 mg/dL — ABNORMAL HIGH (ref 0–200)
HDL: 58.6 mg/dL (ref >39.00)
LDL Cholesterol: 162 mg/dL — ABNORMAL HIGH (ref 0–99)
NonHDL: 185.4
Triglycerides: 117 mg/dL (ref 0.0–149.0)
VLDL: 23.4 mg/dL (ref 0.0–40.0)

## 2015-01-02 LAB — CBC WITH DIFFERENTIAL/PLATELET
BASOS ABS: 0 10*3/uL (ref 0.0–0.1)
Basophils Relative: 0.4 % (ref 0.0–3.0)
Eosinophils Absolute: 0.2 10*3/uL (ref 0.0–0.7)
Eosinophils Relative: 3.7 % (ref 0.0–5.0)
HCT: 46.4 % (ref 39.0–52.0)
Hemoglobin: 15.8 g/dL (ref 13.0–17.0)
LYMPHS PCT: 26 % (ref 12.0–46.0)
Lymphs Abs: 1.3 10*3/uL (ref 0.7–4.0)
MCHC: 34 g/dL (ref 30.0–36.0)
MCV: 89.1 fl (ref 78.0–100.0)
MONOS PCT: 12.2 % — AB (ref 3.0–12.0)
Monocytes Absolute: 0.6 10*3/uL (ref 0.1–1.0)
NEUTROS PCT: 57.7 % (ref 43.0–77.0)
Neutro Abs: 2.8 10*3/uL (ref 1.4–7.7)
Platelets: 245 10*3/uL (ref 150.0–400.0)
RBC: 5.21 Mil/uL (ref 4.22–5.81)
RDW: 13 % (ref 11.5–15.5)
WBC: 4.8 10*3/uL (ref 4.0–10.5)

## 2015-01-02 LAB — TSH: TSH: 2.52 u[IU]/mL (ref 0.35–4.50)

## 2015-01-02 MED ORDER — NEXIUM 40 MG PO CPDR: 40.0000 mg | DELAYED_RELEASE_CAPSULE | Freq: Two times a day (BID) | ORAL | Status: DC

## 2015-01-02 MED ORDER — METOCLOPRAMIDE HCL 10 MG PO TABS: 10.0000 mg | ORAL_TABLET | Freq: Two times a day (BID) | ORAL | Status: DC

## 2015-01-02 MED ORDER — ATORVASTATIN CALCIUM 10 MG PO TABS: 10.0000 mg | ORAL_TABLET | Freq: Every day | ORAL | Status: DC

## 2015-01-02 MED ORDER — VALSARTAN-HYDROCHLOROTHIAZIDE 160-12.5 MG PO TABS: 1.0000 | ORAL_TABLET | Freq: Every day | ORAL | Status: DC

## 2015-01-02 MED ORDER — AMLODIPINE BESYLATE 5 MG PO TABS: 2.5000 mg | ORAL_TABLET | Freq: Two times a day (BID) | ORAL | Status: DC

## 2015-01-02 NOTE — Progress Notes (Signed)
Office Note 01/02/2015  CC:  Chief Complaint  Patient presents with  . Annual Exam    HPI:  Carlos James is a 39 y.o. White male who is here to get annual CPE.  Eye exam UTD. Dental UTD: has to get some teeth fixed in near future.     Past Medical History  Diagnosis Date  . Hypertension   . Hyperlipidemia   . Atypical chest pain since 2007    w/u has included a CT angio chest (neg), myoview (normal), EGD  . GERD (gastroesophageal reflux disease)   . Insomnia   . Delayed gastric emptying     Past Surgical History  Procedure Laterality Date  . Cardiovascular stress test  07/2011    Normal, excellent exercise capacity  . Gastric emptying scan  2007    Delayed--reglan started    Family History  Problem Relation Age of Onset  . Coronary artery disease    . Arthritis Mother   . Heart disease Mother   . Hyperlipidemia Mother   . Arthritis Father   . Heart disease Father   . Hyperlipidemia Father     History   Social History  . Marital Status: Married    Spouse Name: N/A  . Number of Children: N/A  . Years of Education: N/A   Occupational History  . Not on file.   Social History Main Topics  . Smoking status: Former Research scientist (life sciences)  . Smokeless tobacco: Current User     Comment: uses 1 can a day, was a periodic smoker, but not now.  . Alcohol Use: Yes  . Drug Use: No  . Sexual Activity: Yes   Other Topics Concern  . Not on file   Social History Narrative   Married, 2 chldren (40 y/o and 40 y/o).   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   Also owns a grading business.   Orig from [place].      Regular exercise: at [employer].   He dips.  No smoking.  Alcohol intake: 3 beers a day on his days off.                Outpatient Prescriptions Prior to Visit  Medication Sig Dispense Refill  . amLODipine (NORVASC) 5 MG tablet Take 0.5 tablets (2.5 mg total) by mouth 2 (two) times daily. 180 tablet 1  . atorvastatin (LIPITOR) 10 MG tablet TAKE ONE  TABLET BY MOUTH EVERY DAY 90 tablet 0  . metoCLOPramide (REGLAN) 10 MG tablet TAKE ONE TABLET BY MOUTH TWICE DAILY 180 tablet 0  . NEXIUM 40 MG capsule TAKE 1 CAPSULE BY MOUTH TWICE DAILY 180 capsule 0  . valsartan-hydrochlorothiazide (DIOVAN-HCT) 160-12.5 MG per tablet Take 1 tablet by mouth daily. 90 tablet 0  . ALPRAZolam (XANAX) 0.5 MG tablet Take 1 tablet (0.5 mg total) by mouth at bedtime as needed. (Patient not taking: Reported on 01/02/2015) 30 tablet 3  . buPROPion (WELLBUTRIN XL) 150 MG 24 hr tablet Take 1 tablet (150 mg total) by mouth daily. (Patient not taking: Reported on 01/02/2015) 30 tablet 4   No facility-administered medications prior to visit.    Allergies  Allergen Reactions  . Ciprofloxacin     REACTION: Nausea    ROS Review of Systems  Constitutional: Negative for fever, chills, appetite change and fatigue.  HENT: Negative for congestion, dental problem, ear pain and sore throat.   Eyes: Negative for discharge, redness and visual disturbance.  Respiratory: Negative for cough, chest tightness, shortness of breath  and wheezing.   Cardiovascular: Negative for chest pain, palpitations and leg swelling.  Gastrointestinal: Negative for nausea, vomiting, abdominal pain, diarrhea and blood in stool.  Genitourinary: Negative for dysuria, urgency, frequency, hematuria, flank pain and difficulty urinating.  Musculoskeletal: Negative for myalgias, back pain, joint swelling, arthralgias and neck stiffness.  Skin: Negative for pallor and rash.  Neurological: Negative for dizziness, speech difficulty, weakness and headaches.  Hematological: Negative for adenopathy. Does not bruise/bleed easily.  Psychiatric/Behavioral: Negative for confusion and sleep disturbance. The patient is not nervous/anxious.     PE; Blood pressure 122/64, pulse 80, temperature 97.8 F (36.6 C), temperature source Oral, resp. rate 12, height 5' 10"  (1.778 m), weight 227 lb 1.9 oz (103.021 kg), SpO2 96  %. BMI 32.5 Gen: Alert, well appearing.  Patient is oriented to person, place, time, and situation. AFFECT: pleasant, lucid thought and speech. ENT: Ears: EACs clear, normal epithelium.  TMs with good light reflex and landmarks bilaterally.  Eyes: no injection, icteris, swelling, or exudate.  EOMI, PERRLA. Nose: no drainage or turbinate edema/swelling.  No injection or focal lesion.  Mouth: lips without lesion/swelling.  Oral mucosa pink and moist.  Dentition intact and without obvious caries or gingival swelling.  Oropharynx without erythema, exudate, or swelling.  Neck: supple/nontender.  No LAD, mass, or TM.  Carotid pulses 2+ bilaterally, without bruits. CV: RRR, no m/r/g.   LUNGS: CTA bilat, nonlabored resps, good aeration in all lung fields. ABD: soft, NT, ND, BS normal.  No hepatospenomegaly or mass.  No bruits. EXT: no clubbing, cyanosis, or edema.  Musculoskeletal: no joint swelling, erythema, warmth, or tenderness.  ROM of all joints intact. Skin - no sores or suspicious lesions or rashes or color changes  Pertinent labs:  Lab Results  Component Value Date   TSH 2.48 02/28/2014   Lab Results  Component Value Date   WBC 3.6* 10/16/2009   HGB 14.6 10/16/2009   HCT 42.2 10/16/2009   MCV 88.6 10/16/2009   PLT 214.0 10/16/2009   Lab Results  Component Value Date   CREATININE 0.8 02/28/2014   BUN 15 02/28/2014   NA 137 02/28/2014   K 4.0 02/28/2014   CL 106 02/28/2014   CO2 25 02/28/2014   Lab Results  Component Value Date   ALT 38 02/28/2014   AST 22 02/28/2014   ALKPHOS 82 02/28/2014   BILITOT 0.8 02/28/2014   Lab Results  Component Value Date   CHOL 244* 02/28/2014   Lab Results  Component Value Date   HDL 48.60 02/28/2014   Lab Results  Component Value Date   LDLCALC 165* 02/28/2014   Lab Results  Component Value Date   TRIG 150.0* 02/28/2014   Lab Results  Component Value Date   CHOLHDL 5 02/28/2014   ASSESSMENT AND PLAN:   Health maintenance  exam: Reviewed age and gender appropriate health maintenance issues (prudent diet, regular exercise, health risks of tobacco and excessive alcohol, use of seatbelts, fire alarms in home, use of sunscreen).  Also reviewed age and gender appropriate health screening as well as vaccine recommendations. HP labs drawn today (fasting). Vaccines UTD.  He declined HIV screening today.  An After Visit Summary was printed and given to the patient.  FOLLOW UP:  No Follow-up on file.

## 2015-01-03 ENCOUNTER — Other Ambulatory Visit: Payer: Self-pay | Admitting: Family Medicine

## 2015-01-03 ENCOUNTER — Other Ambulatory Visit (INDEPENDENT_AMBULATORY_CARE_PROVIDER_SITE_OTHER): Payer: 59

## 2015-01-03 DIAGNOSIS — R7301 Impaired fasting glucose: Secondary | ICD-10-CM

## 2015-01-03 LAB — HEMOGLOBIN A1C: HEMOGLOBIN A1C: 5.6 % (ref 4.6–6.5)

## 2015-08-12 IMAGING — CR DG FINGER LITTLE 2+V*R*
3 series · 3 of 3 positions shown · non-contrast
Comparison: None.

CLINICAL DATA: Right little finger injury, pain.

EXAM:
RIGHT LITTLE FINGER 2+V

[x finger pa right]
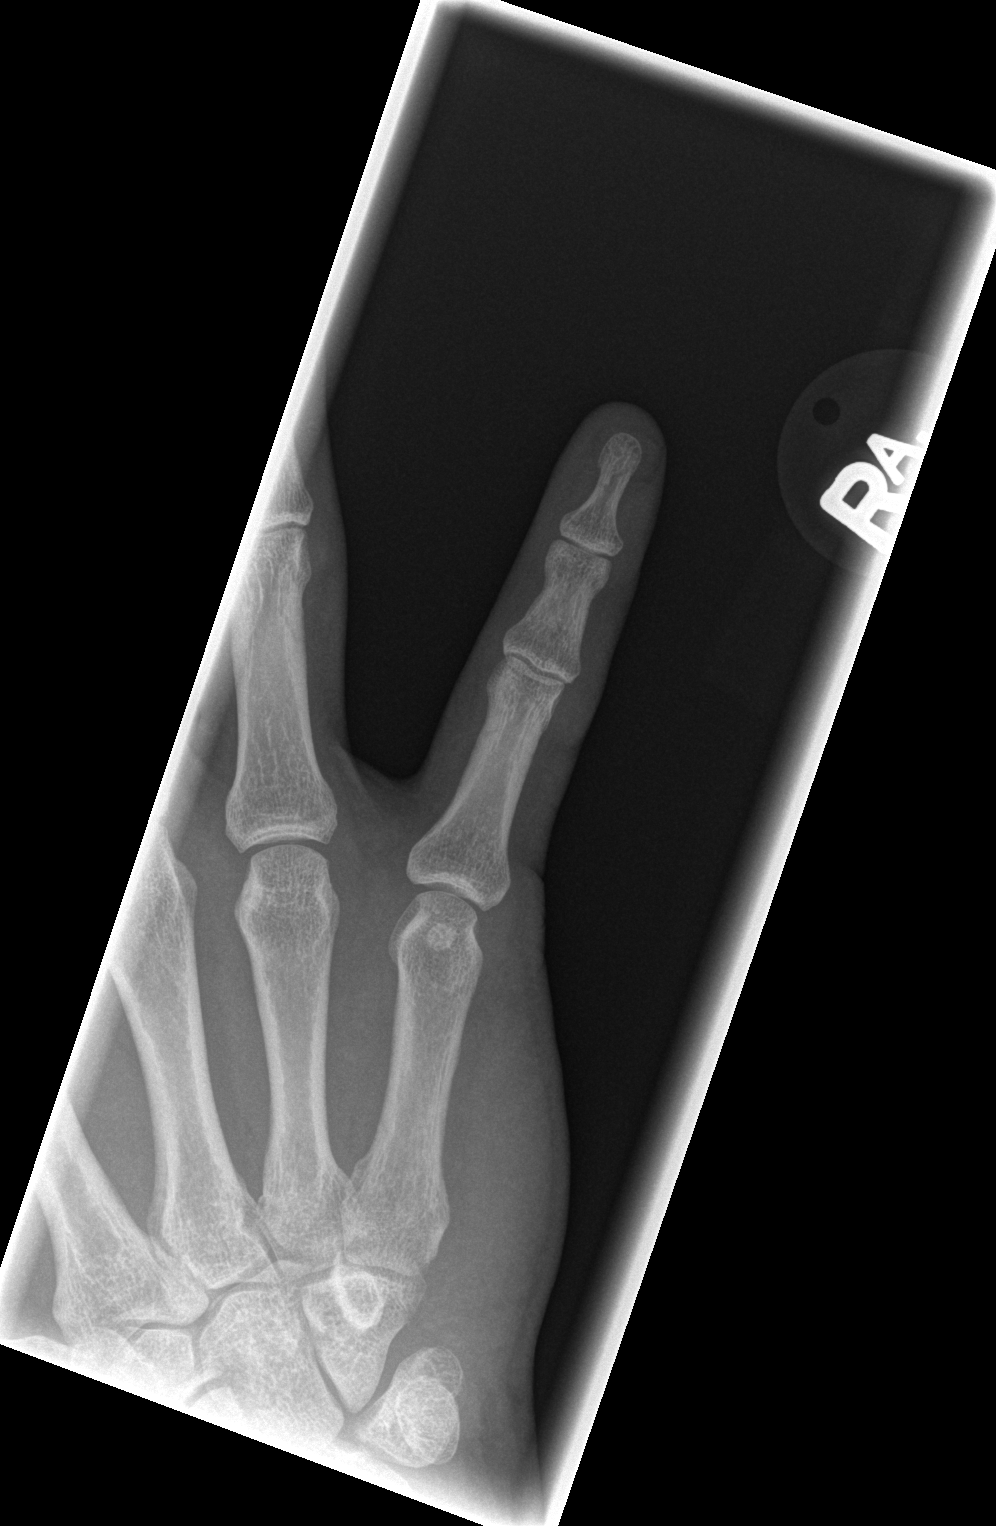

[x finger obl. right]
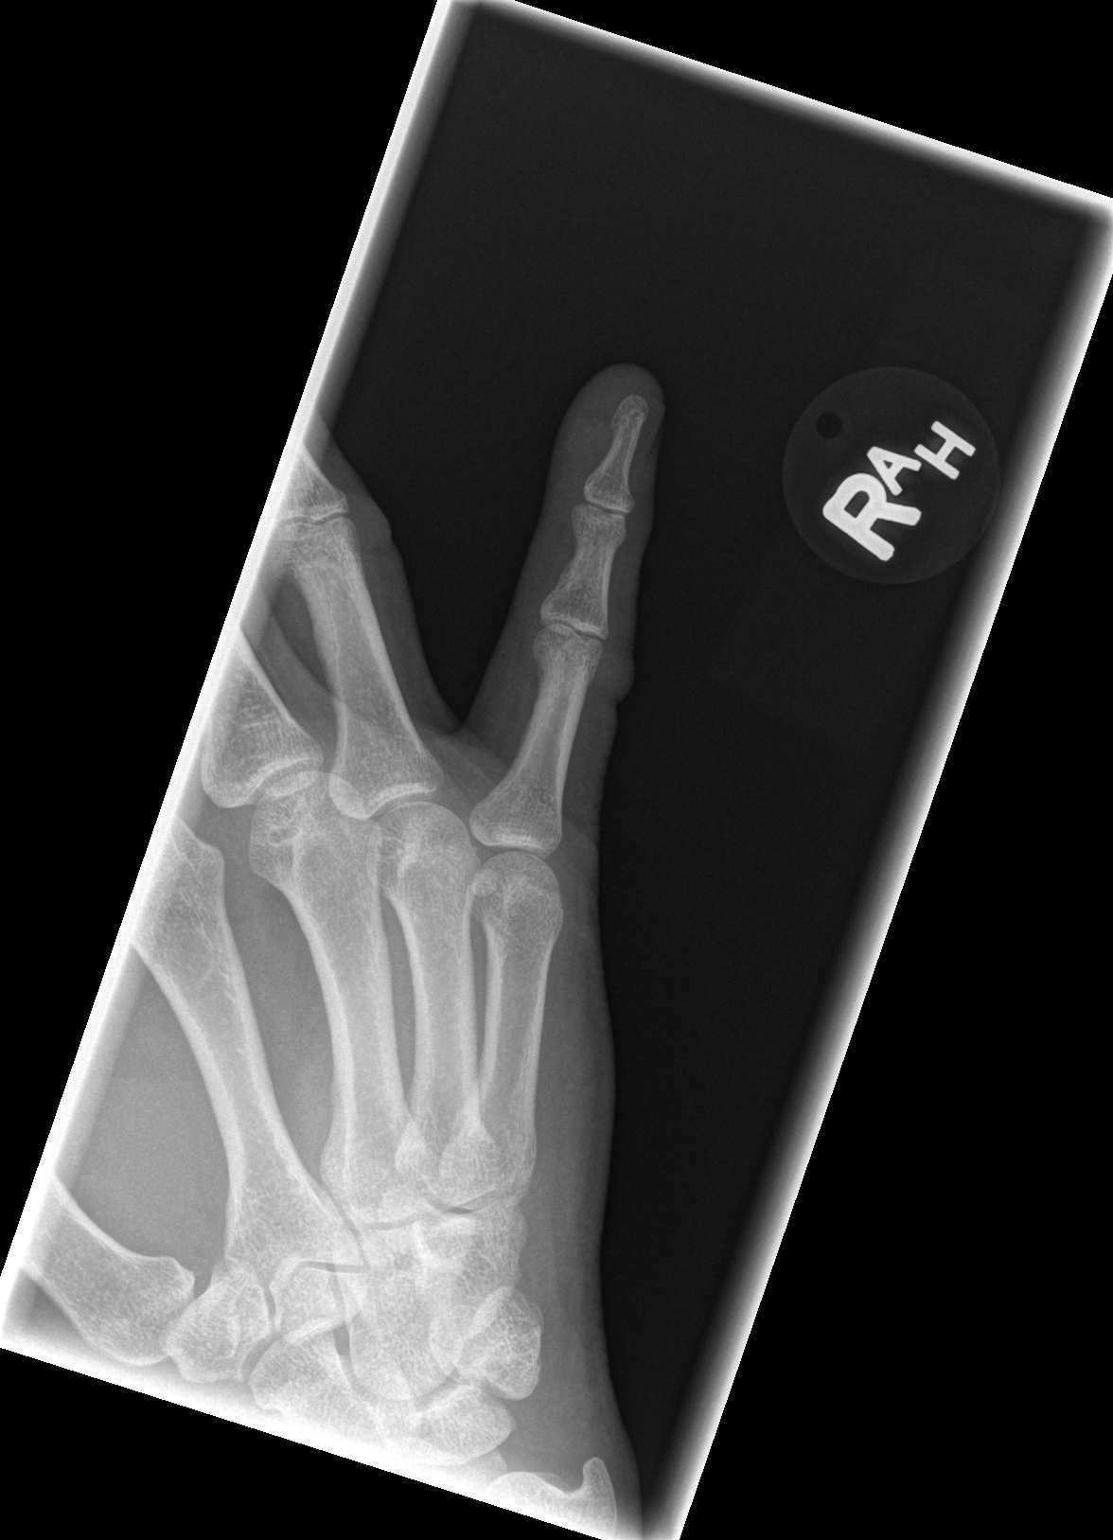

[x finger lateral right]
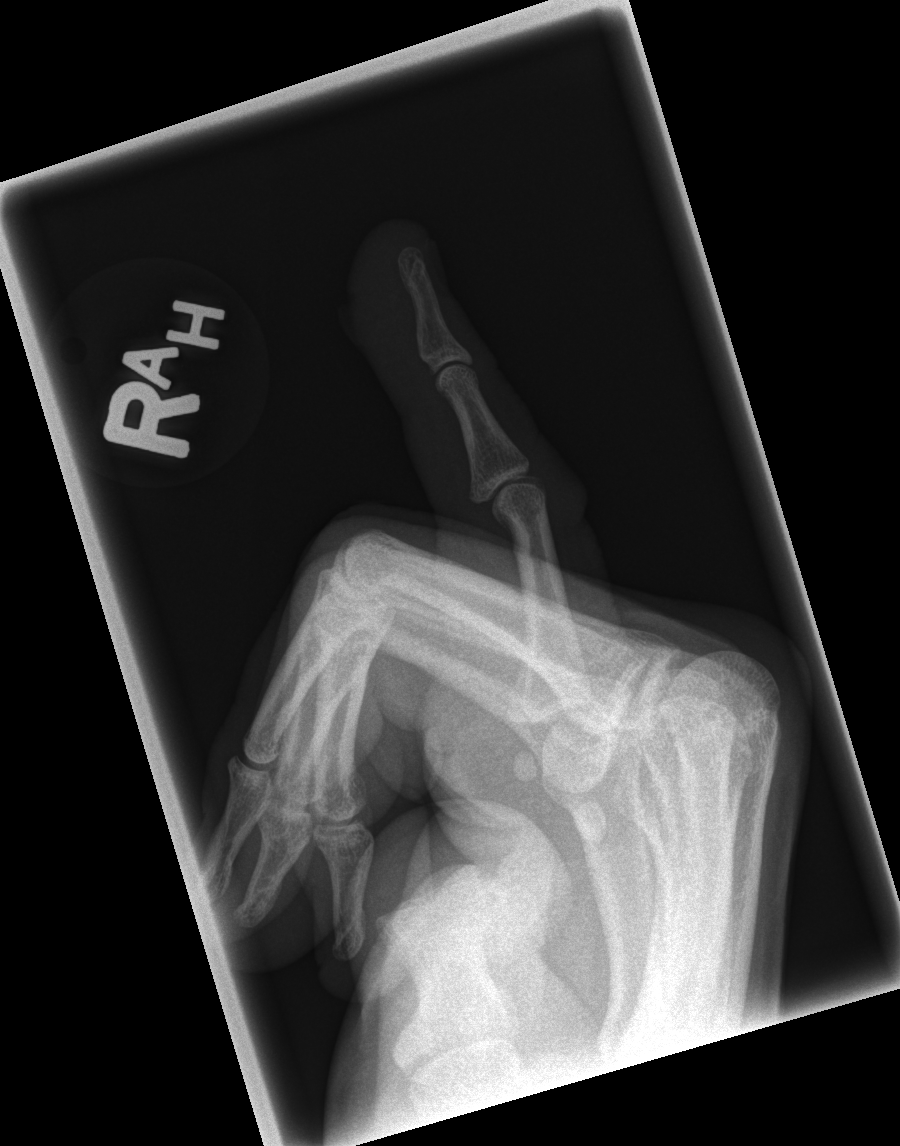

[3 of 3 positions shown; findings below may reference images not displayed]

FINDINGS: No fracture, dislocation or radiopaque foreign body is identified.
Soft tissue swelling distally and likely laceration on the volar
aspect of the distal right little finger is noted.
IMPRESSION: Negative for fracture or foreign body.  Soft tissue swelling.

## 2016-01-07 ENCOUNTER — Encounter: Payer: Self-pay | Admitting: Family Medicine

## 2016-01-07 ENCOUNTER — Ambulatory Visit (INDEPENDENT_AMBULATORY_CARE_PROVIDER_SITE_OTHER): Payer: 59 | Admitting: Family Medicine

## 2016-01-07 VITALS — BP 143/92 | HR 66 | Temp 98.0°F | Resp 16 | Ht 70.5 in | Wt 237.5 lb

## 2016-01-07 DIAGNOSIS — Z0000 Encounter for general adult medical examination without abnormal findings: Secondary | ICD-10-CM

## 2016-01-07 DIAGNOSIS — I10 Essential (primary) hypertension: Secondary | ICD-10-CM

## 2016-01-07 DIAGNOSIS — R7301 Impaired fasting glucose: Secondary | ICD-10-CM

## 2016-01-07 DIAGNOSIS — E785 Hyperlipidemia, unspecified: Secondary | ICD-10-CM | POA: Diagnosis not present

## 2016-01-07 LAB — COMPREHENSIVE METABOLIC PANEL
ALT: 40 U/L (ref 0–53)
AST: 31 U/L (ref 0–37)
Albumin: 4.4 g/dL (ref 3.5–5.2)
Alkaline Phosphatase: 78 U/L (ref 39–117)
BUN: 15 mg/dL (ref 6–23)
CALCIUM: 9.3 mg/dL (ref 8.4–10.5)
CO2: 29 meq/L (ref 19–32)
CREATININE: 0.89 mg/dL (ref 0.40–1.50)
Chloride: 104 mEq/L (ref 96–112)
GFR: 100.32 mL/min (ref >60.00)
Glucose, Bld: 99 mg/dL (ref 70–99)
Potassium: 4 mEq/L (ref 3.5–5.1)
SODIUM: 141 meq/L (ref 135–145)
Total Bilirubin: 0.5 mg/dL (ref 0.2–1.2)
Total Protein: 7.4 g/dL (ref 6.0–8.3)

## 2016-01-07 LAB — LIPID PANEL
CHOL/HDL RATIO: 3
Cholesterol: 236 mg/dL — ABNORMAL HIGH (ref 0–200)
HDL: 69.7 mg/dL (ref >39.00)
LDL Cholesterol: 151 mg/dL — ABNORMAL HIGH (ref 0–99)
NonHDL: 166.04
TRIGLYCERIDES: 73 mg/dL (ref 0.0–149.0)
VLDL: 14.6 mg/dL (ref 0.0–40.0)

## 2016-01-07 LAB — CBC WITH DIFFERENTIAL/PLATELET
BASOS ABS: 0 10*3/uL (ref 0.0–0.1)
Basophils Relative: 0.2 % (ref 0.0–3.0)
Eosinophils Absolute: 0.2 10*3/uL (ref 0.0–0.7)
Eosinophils Relative: 2.9 % (ref 0.0–5.0)
HCT: 43.8 % (ref 39.0–52.0)
Hemoglobin: 14.9 g/dL (ref 13.0–17.0)
Lymphocytes Relative: 26.7 % (ref 12.0–46.0)
Lymphs Abs: 1.5 10*3/uL (ref 0.7–4.0)
MCHC: 34 g/dL (ref 30.0–36.0)
MCV: 91.4 fl (ref 78.0–100.0)
Monocytes Absolute: 0.5 10*3/uL (ref 0.1–1.0)
Monocytes Relative: 8.2 % (ref 3.0–12.0)
Neutro Abs: 3.6 10*3/uL (ref 1.4–7.7)
Neutrophils Relative %: 62 % (ref 43.0–77.0)
PLATELETS: 235 10*3/uL (ref 150.0–400.0)
RBC: 4.79 Mil/uL (ref 4.22–5.81)
RDW: 12.5 % (ref 11.5–15.5)
WBC: 5.8 10*3/uL (ref 4.0–10.5)

## 2016-01-07 LAB — TSH: TSH: 2.36 u[IU]/mL (ref 0.35–4.50)

## 2016-01-07 LAB — HEMOGLOBIN A1C: HEMOGLOBIN A1C: 5.5 % (ref 4.6–6.5)

## 2016-01-07 NOTE — Progress Notes (Signed)
Pre visit review using our clinic review tool, if applicable. No additional management support is needed unless otherwise documented below in the visit note. 

## 2016-01-07 NOTE — Progress Notes (Signed)
Office Note 01/07/2016  CC:  Chief Complaint  Patient presents with  . Annual Exam    Pt is fasting.     HPI:  Carlos James is a 40 y.o. White male who is here for annual health maintenance exam. No formal exercise but is active. Diet not very healthy.  Doesn't monitor bp outside of his home.  Eye exam 10/2015. Dental preventative q12-18 mo.  Stung by a bee a few days ago, was put on prednisone by another physician, swelling has gone down considerably per his report.  Also, hit left thumb with hammer about 6 mo ago.  It did not swell.  It still hurts when he ext/abducts it. He wonders about getting an x-ray but decided to hold off today.    Past Medical History  Diagnosis Date  . Hypertension   . Hyperlipidemia   . Atypical chest pain since 2007    w/u has included a CT angio chest (neg), myoview (normal), EGD  . GERD (gastroesophageal reflux disease)   . Insomnia   . Delayed gastric emptying     Past Surgical History  Procedure Laterality Date  . Cardiovascular stress test  07/2011    Normal, excellent exercise capacity  . Gastric emptying scan  2007    Delayed--reglan started    Family History  Problem Relation Age of Onset  . Coronary artery disease    . Arthritis Mother   . Heart disease Mother   . Hyperlipidemia Mother   . Arthritis Father   . Heart disease Father   . Hyperlipidemia Father     Social History   Social History  . Marital Status: Married    Spouse Name: N/A  . Number of Children: N/A  . Years of Education: N/A   Occupational History  . Not on file.   Social History Main Topics  . Smoking status: Former Research scientist (life sciences)  . Smokeless tobacco: Current User     Comment: uses 1 can a day, was a periodic smoker, but not now.  . Alcohol Use: Yes  . Drug Use: No  . Sexual Activity: Yes   Other Topics Concern  . Not on file   Social History Narrative   Married, 2 chldren.   Occupation:    Also owns a Technical sales engineer business.   Orig  from [place].      Regular exercise: at [employer].   He dips.  No smoking.  Alcohol intake: 3 beers a day on his days off.                Outpatient Prescriptions Prior to Visit  Medication Sig Dispense Refill  . ALPRAZolam (XANAX) 0.5 MG tablet Take 1 tablet (0.5 mg total) by mouth at bedtime as needed. 30 tablet 3  . amLODipine (NORVASC) 5 MG tablet Take 0.5 tablets (2.5 mg total) by mouth 2 (two) times daily. 180 tablet 3  . atorvastatin (LIPITOR) 10 MG tablet Take 1 tablet (10 mg total) by mouth daily. 90 tablet 3  . metoCLOPramide (REGLAN) 10 MG tablet Take 1 tablet (10 mg total) by mouth 2 (two) times daily. 180 tablet 3  . NEXIUM 40 MG capsule Take 1 capsule (40 mg total) by mouth 2 (two) times daily. 180 capsule 3  . valsartan-hydrochlorothiazide (DIOVAN-HCT) 160-12.5 MG per tablet Take 1 tablet by mouth daily. 90 tablet 3  . buPROPion (WELLBUTRIN XL) 150 MG 24 hr tablet Take 1 tablet (150 mg total) by mouth daily. (Patient not taking: Reported on 01/02/2015)  30 tablet 4   No facility-administered medications prior to visit.    Allergies  Allergen Reactions  . Ciprofloxacin     REACTION: Nausea    ROS Review of Systems  Constitutional: Negative for fever, chills, appetite change and fatigue.  HENT: Negative for congestion, dental problem, ear pain and sore throat.   Eyes: Negative for discharge, redness and visual disturbance.  Respiratory: Negative for cough, chest tightness, shortness of breath and wheezing.   Cardiovascular: Negative for chest pain, palpitations and leg swelling.  Gastrointestinal: Negative for nausea, vomiting, abdominal pain, diarrhea and blood in stool.  Genitourinary: Negative for dysuria, urgency, frequency, hematuria, flank pain and difficulty urinating.  Musculoskeletal: Negative for myalgias, back pain, joint swelling, arthralgias and neck stiffness.  Skin: Negative for pallor and rash.  Neurological: Negative for dizziness, speech  difficulty, weakness and headaches.  Hematological: Negative for adenopathy. Does not bruise/bleed easily.  Psychiatric/Behavioral: Negative for confusion and sleep disturbance. The patient is not nervous/anxious.     PE; Blood pressure 143/92, pulse 66, temperature 98 F (36.7 C), temperature source Oral, resp. rate 16, height 5' 10.5" (1.791 m), weight 237 lb 8 oz (107.729 kg), SpO2 96 %. Gen: Alert, well appearing.  Patient is oriented to person, place, time, and situation. AFFECT: pleasant, lucid thought and speech. ENT: Ears: EACs clear, normal epithelium.  TMs with good light reflex and landmarks bilaterally.  Eyes: no injection, icteris, swelling, or exudate.  EOMI, PERRLA. Nose: no drainage or turbinate edema/swelling.  No injection or focal lesion.  Mouth: lips without lesion/swelling.  Oral mucosa pink and moist.  Dentition intact and without obvious caries or gingival swelling.  Oropharynx without erythema, exudate, or swelling.  Neck: supple/nontender.  No LAD, mass, or TM.  Carotid pulses 2+ bilaterally, without bruits. CV: RRR, no m/r/g.   LUNGS: CTA bilat, nonlabored resps, good aeration in all lung fields. ABD: soft, NT, ND, BS normal.  No hepatospenomegaly or mass.  No bruits. EXT: no clubbing, cyanosis, or edema.   L thumb with mild focal TTP over first MCP joint and 1st metacarpal. NO swelling or erythema or ecchymoses.  Right lower leg: no erythema, warmth, or swelling. Musculoskeletal: no joint swelling, erythema, warmth, or tenderness.  ROM of all joints intact. Skin - no sores or suspicious lesions or rashes or color changes   Pertinent labs:  Lab Results  Component Value Date   HGBA1C 5.6 01/03/2015    Lab Results  Component Value Date   TSH 2.52 01/02/2015   Lab Results  Component Value Date   WBC 4.8 01/02/2015   HGB 15.8 01/02/2015   HCT 46.4 01/02/2015   MCV 89.1 01/02/2015   PLT 245.0 01/02/2015   Lab Results  Component Value Date   CREATININE  0.89 01/02/2015   BUN 14 01/02/2015   NA 139 01/02/2015   K 4.2 01/02/2015   CL 102 01/02/2015   CO2 30 01/02/2015   Lab Results  Component Value Date   ALT 44 01/02/2015   AST 29 01/02/2015   ALKPHOS 92 01/02/2015   BILITOT 1.0 01/02/2015   Lab Results  Component Value Date   CHOL 244* 01/02/2015   Lab Results  Component Value Date   HDL 58.60 01/02/2015   Lab Results  Component Value Date   LDLCALC 162* 01/02/2015   Lab Results  Component Value Date   TRIG 117.0 01/02/2015   Lab Results  Component Value Date   CHOLHDL 4 01/02/2015    ASSESSMENT AND PLAN:  Health maintenance exam:  Reviewed age and gender appropriate health maintenance issues (prudent diet, regular exercise, health risks of tobacco and excessive alcohol, use of seatbelts, fire alarms in home, use of sunscreen).  Also reviewed age and gender appropriate health screening as well as vaccine recommendations. Vaccines UTD. Fasting HP labs + HbA1c today (hx of IFG). Needs to exercise more and eat better diet. Encouraged pt to check bp at home regularly so we could get a good idea of avg bp.  Thumb contusion 6 mo ago, offered x-ray but he wants to hold off for now, says he will call if he changes his mind. Recent bee sting to [place]---improving with prednisone rx'd by another physician.  An After Visit Summary was printed and given to the patient.   FOLLOW UP:  Return in about 6 months (around 07/09/2016) for routine chronic illness f/u (fasting).  Signed:  Crissie Sickles, MD           01/07/2016

## 2016-01-14 ENCOUNTER — Other Ambulatory Visit: Payer: Self-pay | Admitting: *Deleted

## 2016-01-14 MED ORDER — ESOMEPRAZOLE MAGNESIUM 40 MG PO PACK: 40.0000 mg | PACK | Freq: Two times a day (BID) | ORAL | Status: DC

## 2016-01-14 MED ORDER — NEXIUM 40 MG PO CPDR: 40.0000 mg | DELAYED_RELEASE_CAPSULE | Freq: Two times a day (BID) | ORAL | Status: DC

## 2016-01-14 NOTE — Telephone Encounter (Signed)
RF request for esomeprazole LOV: 01/07/16 Next ov: None Last written: 01/02/15 3180 w/ 3RF

## 2016-01-16 ENCOUNTER — Encounter: Payer: Self-pay | Admitting: Family Medicine

## 2016-02-01 ENCOUNTER — Other Ambulatory Visit: Payer: Self-pay | Admitting: Family Medicine

## 2016-02-01 DIAGNOSIS — I10 Essential (primary) hypertension: Secondary | ICD-10-CM

## 2016-02-20 ENCOUNTER — Other Ambulatory Visit: Payer: Self-pay | Admitting: Family Medicine

## 2016-03-18 ENCOUNTER — Other Ambulatory Visit: Payer: Self-pay | Admitting: Family Medicine

## 2016-03-18 MED ORDER — ATORVASTATIN CALCIUM 10 MG PO TABS: 10.0000 mg | ORAL_TABLET | Freq: Every day | ORAL | 2 refills | Status: DC

## 2016-04-26 ENCOUNTER — Other Ambulatory Visit: Payer: Self-pay | Admitting: Family Medicine

## 2016-07-19 ENCOUNTER — Ambulatory Visit (INDEPENDENT_AMBULATORY_CARE_PROVIDER_SITE_OTHER): Payer: 59 | Admitting: Family Medicine

## 2016-07-19 ENCOUNTER — Encounter: Payer: Self-pay | Admitting: Family Medicine

## 2016-07-19 VITALS — BP 133/91 | HR 89 | Temp 98.2°F | Resp 16 | Ht 70.5 in | Wt 241.2 lb

## 2016-07-19 DIAGNOSIS — F409 Phobic anxiety disorder, unspecified: Secondary | ICD-10-CM

## 2016-07-19 DIAGNOSIS — E7800 Pure hypercholesterolemia, unspecified: Secondary | ICD-10-CM | POA: Diagnosis not present

## 2016-07-19 DIAGNOSIS — F5105 Insomnia due to other mental disorder: Secondary | ICD-10-CM | POA: Diagnosis not present

## 2016-07-19 DIAGNOSIS — I10 Essential (primary) hypertension: Secondary | ICD-10-CM

## 2016-07-19 LAB — BASIC METABOLIC PANEL
BUN: 13 mg/dL (ref 6–23)
CALCIUM: 9.7 mg/dL (ref 8.4–10.5)
CO2: 29 mEq/L (ref 19–32)
Chloride: 102 mEq/L (ref 96–112)
Creatinine, Ser: 0.85 mg/dL (ref 0.40–1.50)
GFR: 105.51 mL/min (ref >60.00)
Glucose, Bld: 88 mg/dL (ref 70–99)
Potassium: 4.7 mEq/L (ref 3.5–5.1)
Sodium: 139 mEq/L (ref 135–145)

## 2016-07-19 LAB — LIPID PANEL
Cholesterol: 218 mg/dL — ABNORMAL HIGH (ref 0–200)
HDL: 69.4 mg/dL (ref >39.00)
LDL Cholesterol: 132 mg/dL — ABNORMAL HIGH (ref 0–99)
NonHDL: 148.53
TRIGLYCERIDES: 85 mg/dL (ref 0.0–149.0)
Total CHOL/HDL Ratio: 3
VLDL: 17 mg/dL (ref 0.0–40.0)

## 2016-07-19 MED ORDER — PRAVASTATIN SODIUM 40 MG PO TABS: 40.0000 mg | ORAL_TABLET | Freq: Every day | ORAL | 2 refills | Status: DC

## 2016-07-19 NOTE — Progress Notes (Signed)
Pre visit review using our clinic review tool, if applicable. No additional management support is needed unless otherwise documented below in the visit note. 

## 2016-07-19 NOTE — Progress Notes (Signed)
OFFICE VISIT  07/19/2016  ( CC:  Chief Complaint  Patient presents with  . Follow-up    RCI, pt is fasting.    HPI:    Patient is a 41 y.o.  male who presents for 6 mo f/u HTN, HLD.   Atorvastatin causes myalgias, even taking it every 3rd day.  Says "all the cholesterol" meds in the past have done this to me (Crestor, livalo, zetia).    No home bp measurements taken since last visit.  Compliant with all meds. Feeling well.   No formal exercise.  Says he no longer takes any xanax hs ("it has been 2-3 years since I've done that"). Will d/c xanax from his med list today.  ROS: no CP, no SOB, no palpitations, no HAs.   Past Medical History:  Diagnosis Date  . Atypical chest pain since 2007   w/u has included a CT angio chest (neg), myoview (normal), EGD  . Delayed gastric emptying   . GERD (gastroesophageal reflux disease)   . Hyperlipidemia   . Hypertension   . IFG (impaired fasting glucose) 2016   HbA1c 5.6%.  Repeat was 5.5% 01/2016.  Marland Kitchen Insomnia     Past Surgical History:  Procedure Laterality Date  . CARDIOVASCULAR STRESS TEST  07/2011   Normal, excellent exercise capacity  . gastric emptying scan  2007   Delayed--reglan started    Outpatient Medications Prior to Visit  Medication Sig Dispense Refill  . amLODipine (NORVASC) 5 MG tablet TAKE 1/2 TABLET BY MOUTH 2 TIMES DAILY. 180 tablet 0  . esomeprazole (NEXIUM) 40 MG capsule TAKE ONE CAPSULE BY MOUTH TWICE A DAY 180 capsule 3  . metoCLOPramide (REGLAN) 10 MG tablet TAKE 1 TABLET TWICE DAILY 180 tablet 1  . valsartan-hydrochlorothiazide (DIOVAN-HCT) 160-12.5 MG tablet TAKE ONE TABLET BY MOUTH DAILY. 90 tablet 1  . ALPRAZolam (XANAX) 0.5 MG tablet Take 1 tablet (0.5 mg total) by mouth at bedtime as needed. 30 tablet 3  . atorvastatin (LIPITOR) 10 MG tablet Take 1 tablet (10 mg total) by mouth daily. 90 tablet 2  . esomeprazole (NEXIUM) 40 MG packet Take 40 mg by mouth 2 (two) times daily. (Patient not taking:  Reported on 07/19/2016) 180 each 3  . predniSONE (DELTASONE) 20 MG tablet Tapered     No facility-administered medications prior to visit.     Allergies  Allergen Reactions  . Ciprofloxacin     REACTION: Nausea    ROS As per HPI  PE: Blood pressure (!) 133/91, pulse 89, temperature 98.2 F (36.8 C), temperature source Oral, resp. rate 16, height 5' 10.5" (1.791 m), weight 241 lb 4 oz (109.4 kg), SpO2 97 %. Gen: Alert, well appearing.  Patient is oriented to person, place, time, and situation. AFFECT: pleasant, lucid thought and speech. CV: RRR, no m/r/g.   LUNGS: CTA bilat, nonlabored resps, good aeration in all lung fields. EXT: no clubbing, cyanosis, or edema.    LABS:    Chemistry      Component Value Date/Time   NA 141 01/07/2016 0849   K 4.0 01/07/2016 0849   CL 104 01/07/2016 0849   CO2 29 01/07/2016 0849   BUN 15 01/07/2016 0849   CREATININE 0.89 01/07/2016 0849      Component Value Date/Time   CALCIUM 9.3 01/07/2016 0849   ALKPHOS 78 01/07/2016 0849   AST 31 01/07/2016 0849   ALT 40 01/07/2016 0849   BILITOT 0.5 01/07/2016 0849     Lab Results  Component  Value Date   CHOL 236 (H) 01/07/2016   HDL 69.70 01/07/2016   LDLCALC 151 (H) 01/07/2016   LDLDIRECT 176.7 07/12/2013   TRIG 73.0 01/07/2016   CHOLHDL 3 01/07/2016     IMPRESSION AND PLAN:  1) HTN; The current medical regimen is effective;  continue present plan and medications. Once again, I encouraged pt to check bp outside of medical office to get more data to compare our office measurements to.  Check BMET today.  2) Hyperlipidemia: intolerant of 3 statins now (atorv, rosuva, and livalo, + zetia).  Will d/c atorva and start trial of pravastatin 48m qd.  Recheck FLP in 2-3 mo--lab visit. Encouraged him to get more aggressive with low fat/low carb diet + cardiovascular exercise.  3) Hx of stress/anxiety-related insomnia: no longer requiring xanax, so I took this med off of his med list  today.  An After Visit Summary was printed and given to the patient.  FOLLOW UP: Return in about 6 months (around 01/16/2017) for annual CPE (fasting)-and lab visit 2-3 mo for cholesterol check (fasting).  Signed:  PCrissie Sickles MD           07/19/2016

## 2016-07-23 ENCOUNTER — Other Ambulatory Visit: Payer: Self-pay | Admitting: Family Medicine

## 2016-07-23 DIAGNOSIS — I10 Essential (primary) hypertension: Secondary | ICD-10-CM

## 2016-07-23 NOTE — Telephone Encounter (Signed)
RF request for amlodipine LOV: 07/19/16 Next ov: 09/27/16 Last written: 02/01/16 3180 w/ 0RF

## 2016-08-18 ENCOUNTER — Other Ambulatory Visit: Payer: Self-pay | Admitting: Family Medicine

## 2016-08-20 ENCOUNTER — Other Ambulatory Visit: Payer: Self-pay | Admitting: Family Medicine

## 2016-08-27 ENCOUNTER — Other Ambulatory Visit: Payer: Self-pay | Admitting: Family Medicine

## 2016-09-20 ENCOUNTER — Other Ambulatory Visit: Payer: Self-pay | Admitting: *Deleted

## 2016-09-20 MED ORDER — PRAVASTATIN SODIUM 40 MG PO TABS: 40.0000 mg | ORAL_TABLET | Freq: Every day | ORAL | 1 refills | Status: DC

## 2016-09-20 NOTE — Telephone Encounter (Signed)
[facility] - requesting 90 day supply  RF request for pravastatin LOV: 07/19/16 Next ov: 09/27/16 Last written: 07/19/16 #30 w/ 2RF

## 2016-09-27 ENCOUNTER — Other Ambulatory Visit (INDEPENDENT_AMBULATORY_CARE_PROVIDER_SITE_OTHER): Payer: 59

## 2016-09-27 DIAGNOSIS — E785 Hyperlipidemia, unspecified: Secondary | ICD-10-CM

## 2016-09-27 LAB — LIPID PANEL
CHOL/HDL RATIO: 3
Cholesterol: 207 mg/dL — ABNORMAL HIGH (ref 0–200)
HDL: 69.8 mg/dL (ref >39.00)
LDL Cholesterol: 124 mg/dL — ABNORMAL HIGH (ref 0–99)
NonHDL: 136.81
TRIGLYCERIDES: 66 mg/dL (ref 0.0–149.0)
VLDL: 13.2 mg/dL (ref 0.0–40.0)

## 2016-11-15 ENCOUNTER — Ambulatory Visit (INDEPENDENT_AMBULATORY_CARE_PROVIDER_SITE_OTHER): Payer: 59 | Admitting: Family Medicine

## 2016-11-15 ENCOUNTER — Encounter: Payer: Self-pay | Admitting: Family Medicine

## 2016-11-15 VITALS — BP 113/75 | HR 87 | Temp 98.7°F | Resp 16 | Ht 70.5 in | Wt 243.0 lb

## 2016-11-15 DIAGNOSIS — M7022 Olecranon bursitis, left elbow: Secondary | ICD-10-CM | POA: Diagnosis not present

## 2016-11-15 LAB — CBC WITH DIFFERENTIAL/PLATELET
BASOS ABS: 0 10*3/uL (ref 0.0–0.1)
BASOS PCT: 0.3 % (ref 0.0–3.0)
EOS ABS: 0.1 10*3/uL (ref 0.0–0.7)
Eosinophils Relative: 1.7 % (ref 0.0–5.0)
HCT: 43.3 % (ref 39.0–52.0)
Hemoglobin: 14.8 g/dL (ref 13.0–17.0)
LYMPHS ABS: 1.2 10*3/uL (ref 0.7–4.0)
Lymphocytes Relative: 14.2 % (ref 12.0–46.0)
MCHC: 34.3 g/dL (ref 30.0–36.0)
MCV: 90.7 fl (ref 78.0–100.0)
MONO ABS: 0.7 10*3/uL (ref 0.1–1.0)
Monocytes Relative: 8.1 % (ref 3.0–12.0)
NEUTROS ABS: 6.5 10*3/uL (ref 1.4–7.7)
NEUTROS PCT: 75.7 % (ref 43.0–77.0)
PLATELETS: 237 10*3/uL (ref 150.0–400.0)
RBC: 4.77 Mil/uL (ref 4.22–5.81)
RDW: 12.2 % (ref 11.5–15.5)
WBC: 8.6 10*3/uL (ref 4.0–10.5)

## 2016-11-15 LAB — URIC ACID: Uric Acid, Serum: 7.5 mg/dL (ref 4.0–7.8)

## 2016-11-15 MED ORDER — CEPHALEXIN 500 MG PO CAPS: 500.0000 mg | ORAL_CAPSULE | Freq: Three times a day (TID) | ORAL | 0 refills | Status: DC

## 2016-11-15 MED ORDER — PREDNISONE 20 MG PO TABS: ORAL_TABLET | ORAL | 0 refills | Status: DC

## 2016-11-15 NOTE — Progress Notes (Signed)
OFFICE VISIT  11/15/2016   CC:  Chief Complaint  Patient presents with  . Joint Swelling    left, started yesterday   HPI:    Patient is a 41 y.o. Caucasian male who presents for left elbow complaint. Got up yesterday morning and it felt very sore, some swelling that felt soft/fluid-filled. Took ibuprofen 845m yesterday and today.  No ice applied. He has not ever had gout.  No injury to elbow.  Still swollen and tender and red. No fever or malaise.  No recent change in types of food he eats. + FH of gout.  Past Medical History:  Diagnosis Date  . Atypical chest pain since 2007   w/u has included a CT angio chest (neg), myoview (normal), EGD  . Delayed gastric emptying   . GERD (gastroesophageal reflux disease)   . Hyperlipidemia    Intol of atorv, rosuva, lovalo, and zetia.  . Hypertension   . IFG (impaired fasting glucose) 2016   HbA1c 5.6%.  Repeat was 5.5% 01/2016.  .Marland KitchenInsomnia     Past Surgical History:  Procedure Laterality Date  . CARDIOVASCULAR STRESS TEST  07/2011   Normal, excellent exercise capacity  . gastric emptying scan  2007   Delayed--reglan started    Outpatient Medications Prior to Visit  Medication Sig Dispense Refill  . amLODipine (NORVASC) 5 MG tablet TAKE 1/2 TABLET BY MOUTH 2 TIMES DAILY. 180 tablet 1  . esomeprazole (NEXIUM) 40 MG capsule TAKE ONE CAPSULE BY MOUTH TWICE A DAY 180 capsule 3  . metoCLOPramide (REGLAN) 10 MG tablet TAKE 1 TABLET TWICE DAILY 180 tablet 1  . pravastatin (PRAVACHOL) 40 MG tablet Take 1 tablet (40 mg total) by mouth daily. 90 tablet 1  . valsartan-hydrochlorothiazide (DIOVAN-HCT) 160-12.5 MG tablet TAKE ONE TABLET BY MOUTH DAILY. 90 tablet 1  . valsartan-hydrochlorothiazide (DIOVAN-HCT) 160-12.5 MG tablet TAKE ONE TABLET BY MOUTH DAILY. (Patient not taking: Reported on 11/15/2016) 90 tablet 1   No facility-administered medications prior to visit.     Allergies  Allergen Reactions  . Ciprofloxacin     REACTION:  Nausea    ROS As per HPI  PE: Blood pressure 113/75, pulse 87, temperature 98.7 F (37.1 C), temperature source Oral, resp. rate 16, height 5' 10.5" (1.791 m), weight 243 lb (110.2 kg), SpO2 95 %. Gen: Alert, well appearing.  Patient is oriented to person, place, time, and situation. AFFECT: pleasant, lucid thought and speech. LEFT ELBOW: mild ballotable swelling over olecranon, with mild warmth and deep pinkish erythema diffusely over posterior elbow, mild TTP over posterior elbow.  NO drainage tract.  ROM of L elbow fully intact.  NO streaking.  LABS:    Chemistry      Component Value Date/Time   NA 139 07/19/2016 0825   K 4.7 07/19/2016 0825   CL 102 07/19/2016 0825   CO2 29 07/19/2016 0825   BUN 13 07/19/2016 0825   CREATININE 0.85 07/19/2016 0825      Component Value Date/Time   CALCIUM 9.7 07/19/2016 0825   ALKPHOS 78 01/07/2016 0849   AST 31 01/07/2016 0849   ALT 40 01/07/2016 0849   BILITOT 0.5 01/07/2016 0849     Lab Results  Component Value Date   WBC 5.8 01/07/2016   HGB 14.9 01/07/2016   HCT 43.8 01/07/2016   MCV 91.4 01/07/2016   PLT 235.0 01/07/2016   IMPRESSION AND PLAN:  1) Olecranon bursitis; doubt infectious.  Suspect more of inflammatory condition like gouty arthritis. Using  sterile technique today I used an 18 gauge 1 and 1/2 inch needle to aspirate the olecranon bursa--about 1-2 ml of straw colored fluid was all I could get.  Pt tolerated procedure well, w/out any complications.  Wound care and icing discussed. Will cover with keflex 500 mg tid x 7d and will treat with prednisone 75m qd x 5d. No NSAIDs until off of prednisone. Check CBC w/diff and uric acid level today.  An After Visit Summary was printed and given to the patient.  FOLLOW UP: Return in about 4 days (around 11/19/2016) for f/u L elbow.  Signed:  PCrissie Sickles MD           11/15/2016

## 2016-11-16 ENCOUNTER — Encounter: Payer: Self-pay | Admitting: *Deleted

## 2016-11-16 LAB — SYNOVIAL CELL COUNT + DIFF, W/ CRYSTALS
Basophils, %: 0 %
EOSINOPHILS-SYNOVIAL: 0 % (ref 0–2)
LYMPHOCYTES-SYNOVIAL FLD: 92 % — AB (ref 0–74)
MONOCYTE/MACROPHAGE: 0 % (ref 0–69)
Neutrophil, Synovial: 8 % (ref 0–24)
SYNOVIOCYTES, %: 0 % (ref 0–15)
WBC, SYNOVIAL: 265 {cells}/uL — AB (ref <150)

## 2016-11-18 LAB — WOUND CULTURE
GRAM STAIN: NONE SEEN
GRAM STAIN: NONE SEEN
Gram Stain: NONE SEEN
Organism ID, Bacteria: NO GROWTH

## 2016-11-19 ENCOUNTER — Ambulatory Visit: Payer: 59 | Admitting: Family Medicine

## 2016-11-19 NOTE — Progress Notes (Deleted)
OFFICE VISIT  11/19/2016   CC: No chief complaint on file.    HPI:    Patient is a 41 y.o. Caucasian male who presents for 4 day f/u L olecranon bursitis. We reviewed fluid analysis results today: culture neg, minimal inflammatory cells, no crystals. CBC and URIC acid were normal at initial visit. I rx'd him prednisone 59m qd x 5d and keflex x 7d.   Past Medical History:  Diagnosis Date  . Atypical chest pain since 2007   w/u has included a CT angio chest (neg), myoview (normal), EGD  . Delayed gastric emptying   . GERD (gastroesophageal reflux disease)   . Hyperlipidemia    Intol of atorv, rosuva, lovalo, and zetia.  . Hypertension   . IFG (impaired fasting glucose) 2016   HbA1c 5.6%.  Repeat was 5.5% 01/2016.  .Marland KitchenInsomnia     Past Surgical History:  Procedure Laterality Date  . CARDIOVASCULAR STRESS TEST  07/2011   Normal, excellent exercise capacity  . gastric emptying scan  2007   Delayed--reglan started    Outpatient Medications Prior to Visit  Medication Sig Dispense Refill  . amLODipine (NORVASC) 5 MG tablet TAKE 1/2 TABLET BY MOUTH 2 TIMES DAILY. 180 tablet 1  . cephALEXin (KEFLEX) 500 MG capsule Take 1 capsule (500 mg total) by mouth 3 (three) times daily. 21 capsule 0  . esomeprazole (NEXIUM) 40 MG capsule TAKE ONE CAPSULE BY MOUTH TWICE A DAY 180 capsule 3  . metoCLOPramide (REGLAN) 10 MG tablet TAKE 1 TABLET TWICE DAILY 180 tablet 1  . pravastatin (PRAVACHOL) 40 MG tablet Take 1 tablet (40 mg total) by mouth daily. 90 tablet 1  . predniSONE (DELTASONE) 20 MG tablet 2 tabs po qd x 5d 10 tablet 0  . valsartan-hydrochlorothiazide (DIOVAN-HCT) 160-12.5 MG tablet TAKE ONE TABLET BY MOUTH DAILY. 90 tablet 1   No facility-administered medications prior to visit.     Allergies  Allergen Reactions  . Ciprofloxacin     REACTION: Nausea    ROS As per HPI  PE: There were no vitals taken for this visit. ***  LABS:  Lab Results  Component Value Date   WBC 8.6 11/15/2016   HGB 14.8 11/15/2016   HCT 43.3 11/15/2016   MCV 90.7 11/15/2016   PLT 237.0 11/15/2016   Lab Results  Component Value Date   LABURIC 7.5 11/15/2016    IMPRESSION AND PLAN:  No problem-specific Assessment & Plan notes found for this encounter.   FOLLOW UP: No Follow-up on file.

## 2017-02-21 ENCOUNTER — Other Ambulatory Visit: Payer: Self-pay | Admitting: Family Medicine

## 2017-02-21 NOTE — Telephone Encounter (Signed)
[facility]

## 2017-03-08 ENCOUNTER — Other Ambulatory Visit: Payer: Self-pay | Admitting: Family Medicine

## 2017-03-08 ENCOUNTER — Telehealth: Payer: Self-pay

## 2017-03-08 NOTE — Telephone Encounter (Signed)
Message left for patient to return call, Needs office visit for further refills on medications as indicated in office note.

## 2017-03-11 ENCOUNTER — Telehealth: Payer: Self-pay | Admitting: *Deleted

## 2017-03-11 MED ORDER — PRAVASTATIN SODIUM 40 MG PO TABS: 40.0000 mg | ORAL_TABLET | Freq: Every day | ORAL | 0 refills | Status: DC

## 2017-03-11 NOTE — Telephone Encounter (Signed)
Rx sent for 90 day supply. Pt advised to keep apt on 03/17/17 at 8:00am.

## 2017-03-11 NOTE — Telephone Encounter (Signed)
Patient wife called and scheduled patient for appt next week for hyperlipidemia.she states a 30 day supply was sent to pharmacy for his pravastatin but Insurance only pays for 90 day supply. She is requesting new RX for 90 day supply be sent to pharmacy. Please advise.

## 2017-03-17 ENCOUNTER — Ambulatory Visit (INDEPENDENT_AMBULATORY_CARE_PROVIDER_SITE_OTHER): Payer: 59 | Admitting: Family Medicine

## 2017-03-17 ENCOUNTER — Other Ambulatory Visit (INDEPENDENT_AMBULATORY_CARE_PROVIDER_SITE_OTHER): Payer: 59

## 2017-03-17 ENCOUNTER — Encounter: Payer: Self-pay | Admitting: Family Medicine

## 2017-03-17 VITALS — BP 136/90 | HR 84 | Temp 98.1°F | Resp 16 | Ht 70.5 in | Wt 242.8 lb

## 2017-03-17 DIAGNOSIS — T753XXA Motion sickness, initial encounter: Secondary | ICD-10-CM

## 2017-03-17 DIAGNOSIS — E7800 Pure hypercholesterolemia, unspecified: Secondary | ICD-10-CM

## 2017-03-17 DIAGNOSIS — I10 Essential (primary) hypertension: Secondary | ICD-10-CM | POA: Diagnosis not present

## 2017-03-17 DIAGNOSIS — R7301 Impaired fasting glucose: Secondary | ICD-10-CM | POA: Diagnosis not present

## 2017-03-17 LAB — COMPREHENSIVE METABOLIC PANEL
ALT: 42 U/L (ref 0–53)
AST: 30 U/L (ref 0–37)
Albumin: 4.5 g/dL (ref 3.5–5.2)
Alkaline Phosphatase: 90 U/L (ref 39–117)
BUN: 12 mg/dL (ref 6–23)
CHLORIDE: 104 meq/L (ref 96–112)
CO2: 28 meq/L (ref 19–32)
Calcium: 9.7 mg/dL (ref 8.4–10.5)
Creatinine, Ser: 0.86 mg/dL (ref 0.40–1.50)
GFR: 103.76 mL/min (ref >60.00)
Glucose, Bld: 119 mg/dL — ABNORMAL HIGH (ref 70–99)
POTASSIUM: 4.2 meq/L (ref 3.5–5.1)
SODIUM: 141 meq/L (ref 135–145)
Total Bilirubin: 0.5 mg/dL (ref 0.2–1.2)
Total Protein: 7.2 g/dL (ref 6.0–8.3)

## 2017-03-17 LAB — HEMOGLOBIN A1C: Hgb A1c MFr Bld: 5.8 % (ref 4.6–6.5)

## 2017-03-17 LAB — LIPID PANEL
CHOL/HDL RATIO: 3
Cholesterol: 200 mg/dL (ref 0–200)
HDL: 77.3 mg/dL (ref >39.00)
LDL CALC: 107 mg/dL — AB (ref 0–99)
NONHDL: 122.39
Triglycerides: 77 mg/dL (ref 0.0–149.0)
VLDL: 15.4 mg/dL (ref 0.0–40.0)

## 2017-03-17 MED ORDER — IRBESARTAN-HYDROCHLOROTHIAZIDE 150-12.5 MG PO TABS: 1.0000 | ORAL_TABLET | Freq: Every day | ORAL | 1 refills | Status: DC

## 2017-03-17 MED ORDER — SCOPOLAMINE 1 MG/3DAYS TD PT72: 1.0000 | MEDICATED_PATCH | TRANSDERMAL | 1 refills | Status: DC

## 2017-03-17 NOTE — Progress Notes (Signed)
OFFICE VISIT  03/17/2017   CC:  Chief Complaint  Patient presents with  . Follow-up    RCI, pt is fasting.    HPI:    Patient is a 41 y.o.  male who presents for f/u HTN, HLD.  HLD: no home monitoring.  No dizziness, no HAs, no CP, no SOB.    HTN: compliant with pravastatin, no side effects.  No TLC changes.  Past Medical History:  Diagnosis Date  . Atypical chest pain since 2007   w/u has included a CT angio chest (neg), myoview (normal), EGD  . Delayed gastric emptying   . GERD (gastroesophageal reflux disease)   . Hyperlipidemia    Intol of atorv, rosuva, lovalo, and zetia.  . Hypertension   . IFG (impaired fasting glucose) 2016   HbA1c 5.6%.  Repeat was 5.5% 01/2016.  Marland Kitchen Insomnia     Past Surgical History:  Procedure Laterality Date  . CARDIOVASCULAR STRESS TEST  07/2011   Normal, excellent exercise capacity  . gastric emptying scan  2007   Delayed--reglan started    Outpatient Medications Prior to Visit  Medication Sig Dispense Refill  . amLODipine (NORVASC) 5 MG tablet TAKE 1/2 TABLET BY MOUTH 2 TIMES DAILY. 180 tablet 1  . esomeprazole (NEXIUM) 40 MG capsule TAKE ONE CAPSULE BY MOUTH TWICE A DAY 180 capsule 3  . metoCLOPramide (REGLAN) 10 MG tablet TAKE 1 TABLET TWICE DAILY 180 tablet 1  . pravastatin (PRAVACHOL) 40 MG tablet Take 1 tablet (40 mg total) by mouth daily. 90 tablet 0  . valsartan-hydrochlorothiazide (DIOVAN-HCT) 160-12.5 MG tablet TAKE ONE TABLET BY MOUTH DAILY. 90 tablet 1  . cephALEXin (KEFLEX) 500 MG capsule Take 1 capsule (500 mg total) by mouth 3 (three) times daily. (Patient not taking: Reported on 03/17/2017) 21 capsule 0  . predniSONE (DELTASONE) 20 MG tablet 2 tabs po qd x 5d (Patient not taking: Reported on 03/17/2017) 10 tablet 0  . valsartan-hydrochlorothiazide (DIOVAN-HCT) 160-12.5 MG tablet TAKE ONE TABLET BY MOUTH DAILY. (Patient not taking: Reported on 03/17/2017) 90 tablet 1   No facility-administered medications prior to visit.      Allergies  Allergen Reactions  . Ciprofloxacin     REACTION: Nausea    ROS As per HPI  PE: Blood pressure 136/90, pulse 84, temperature 98.1 F (36.7 C), temperature source Oral, resp. rate 16, height 5' 10.5" (1.791 m), weight 242 lb 12 oz (110.1 kg), SpO2 97 %. Gen: Alert, well appearing.  Patient is oriented to person, place, time, and situation. AFFECT: pleasant, lucid thought and speech. CV: RRR, no m/r/g.   LUNGS: CTA bilat, nonlabored resps, good aeration in all lung fields. EXT: no clubbing, cyanosis, or edema.    LABS:  Lab Results  Component Value Date   TSH 2.36 01/07/2016   Lab Results  Component Value Date   WBC 8.6 11/15/2016   HGB 14.8 11/15/2016   HCT 43.3 11/15/2016   MCV 90.7 11/15/2016   PLT 237.0 11/15/2016   Lab Results  Component Value Date   CREATININE 0.85 07/19/2016   BUN 13 07/19/2016   NA 139 07/19/2016   K 4.7 07/19/2016   CL 102 07/19/2016   CO2 29 07/19/2016   Lab Results  Component Value Date   ALT 40 01/07/2016   AST 31 01/07/2016   ALKPHOS 78 01/07/2016   BILITOT 0.5 01/07/2016   Lab Results  Component Value Date   CHOL 207 (H) 09/27/2016   Lab Results  Component Value Date  HDL 69.80 09/27/2016   Lab Results  Component Value Date   LDLCALC 124 (H) 09/27/2016   Lab Results  Component Value Date   TRIG 66.0 09/27/2016   Lab Results  Component Value Date   CHOLHDL 3 09/27/2016   Lab Results  Component Value Date   HGBA1C 5.5 01/07/2016    IMPRESSION AND PLAN:  1) Hyperlipidemia: tolerating pravastatin 81m qd well. Recheck FLP today.  2) HTN; not ideal control per office measurments. Advised pt to monitor bp/hr at home and bring these in for review at o/v with me in 1 mo. Since valsartan has been recalled, I changed him to irbesartan 150/12.5 today (1 qd).  3) Pt requested scopolamine patches for upcoming cruise.  Rx sent in today.  An After Visit Summary was printed and given to the  patient.  FOLLOW UP: Return in about 4 weeks (around 04/14/2017) for f/u HTN.  Signed:  PCrissie Sickles MD           03/17/2017

## 2017-03-21 ENCOUNTER — Encounter: Payer: Self-pay | Admitting: *Deleted

## 2017-03-25 ENCOUNTER — Encounter: Payer: Self-pay | Admitting: *Deleted

## 2017-04-18 ENCOUNTER — Telehealth: Payer: Self-pay | Admitting: Family Medicine

## 2017-04-18 NOTE — Telephone Encounter (Signed)
Noted.  Pt chose to switch back to valsartan.

## 2017-04-18 NOTE — Telephone Encounter (Signed)
Patient LMOM to cancel his appt 04/19/17.  He states he switched back to old medication because he could not take new medication.  I have canceled his appointment but I thought Dr Anitra Lauth would want to know why he canceled.

## 2017-04-18 NOTE — Telephone Encounter (Signed)
FYI

## 2017-04-19 ENCOUNTER — Ambulatory Visit: Payer: 59 | Admitting: Family Medicine

## 2017-04-23 DIAGNOSIS — M109 Gout, unspecified: Secondary | ICD-10-CM | POA: Diagnosis not present

## 2017-05-13 DIAGNOSIS — M109 Gout, unspecified: Secondary | ICD-10-CM | POA: Diagnosis not present

## 2017-05-16 ENCOUNTER — Other Ambulatory Visit: Payer: Self-pay | Admitting: Family Medicine

## 2017-06-02 ENCOUNTER — Other Ambulatory Visit: Payer: Self-pay | Admitting: Family Medicine

## 2017-06-02 MED ORDER — IRBESARTAN-HYDROCHLOROTHIAZIDE 150-12.5 MG PO TABS: 1.0000 | ORAL_TABLET | Freq: Every day | ORAL | 1 refills | Status: DC

## 2017-07-05 DIAGNOSIS — S61213A Laceration without foreign body of left middle finger without damage to nail, initial encounter: Secondary | ICD-10-CM | POA: Diagnosis not present

## 2017-07-25 ENCOUNTER — Other Ambulatory Visit: Payer: Self-pay | Admitting: Family Medicine

## 2017-07-25 DIAGNOSIS — I10 Essential (primary) hypertension: Secondary | ICD-10-CM

## 2017-09-09 ENCOUNTER — Telehealth: Payer: Self-pay | Admitting: Family Medicine

## 2017-09-09 NOTE — Telephone Encounter (Signed)
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 Pt advised and voiced understanding.

## 2017-09-09 NOTE — Telephone Encounter (Signed)
Copied from [place] [id number]. Topic: Quick Communication - See Telephone Encounter >> Sep 09, 2017  8:04 AM Lolita Rieger, RMA wrote: CRM for notification. 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

## 2017-11-22 ENCOUNTER — Other Ambulatory Visit: Payer: Self-pay | Admitting: Family Medicine

## 2017-11-23 NOTE — Telephone Encounter (Signed)
Pt scheduled CPE w/ Dr. Anitra Lauth via [place] on 12/08/17 at 8:15am.

## 2017-11-25 ENCOUNTER — Other Ambulatory Visit: Payer: Self-pay | Admitting: Family Medicine

## 2017-12-08 ENCOUNTER — Encounter: Payer: 59 | Admitting: Family Medicine

## 2018-01-19 ENCOUNTER — Encounter: Payer: Self-pay | Admitting: Family Medicine

## 2018-01-19 ENCOUNTER — Ambulatory Visit (INDEPENDENT_AMBULATORY_CARE_PROVIDER_SITE_OTHER): Payer: 59 | Admitting: Family Medicine

## 2018-01-19 VITALS — BP 150/106 | HR 92 | Temp 98.3°F | Resp 16 | Ht 70.5 in | Wt 248.4 lb

## 2018-01-19 DIAGNOSIS — I10 Essential (primary) hypertension: Secondary | ICD-10-CM | POA: Diagnosis not present

## 2018-01-19 DIAGNOSIS — E7800 Pure hypercholesterolemia, unspecified: Secondary | ICD-10-CM | POA: Diagnosis not present

## 2018-01-19 DIAGNOSIS — R7301 Impaired fasting glucose: Secondary | ICD-10-CM | POA: Diagnosis not present

## 2018-01-19 DIAGNOSIS — E669 Obesity, unspecified: Secondary | ICD-10-CM

## 2018-01-19 DIAGNOSIS — Z0000 Encounter for general adult medical examination without abnormal findings: Secondary | ICD-10-CM | POA: Diagnosis not present

## 2018-01-19 LAB — LIPID PANEL
CHOLESTEROL: 201 mg/dL — AB (ref 0–200)
HDL: 65.9 mg/dL (ref >39.00)
LDL CALC: 112 mg/dL — AB (ref 0–99)
NonHDL: 135.32
Total CHOL/HDL Ratio: 3
Triglycerides: 118 mg/dL (ref 0.0–149.0)
VLDL: 23.6 mg/dL (ref 0.0–40.0)

## 2018-01-19 LAB — COMPREHENSIVE METABOLIC PANEL
ALBUMIN: 4.5 g/dL (ref 3.5–5.2)
ALK PHOS: 100 U/L (ref 39–117)
ALT: 53 U/L (ref 0–53)
AST: 43 U/L — ABNORMAL HIGH (ref 0–37)
BUN: 14 mg/dL (ref 6–23)
CHLORIDE: 104 meq/L (ref 96–112)
CO2: 29 mEq/L (ref 19–32)
Calcium: 9.7 mg/dL (ref 8.4–10.5)
Creatinine, Ser: 0.84 mg/dL (ref 0.40–1.50)
GFR: 106.19 mL/min (ref >60.00)
Glucose, Bld: 116 mg/dL — ABNORMAL HIGH (ref 70–99)
POTASSIUM: 4.5 meq/L (ref 3.5–5.1)
SODIUM: 142 meq/L (ref 135–145)
TOTAL PROTEIN: 7.6 g/dL (ref 6.0–8.3)
Total Bilirubin: 0.4 mg/dL (ref 0.2–1.2)

## 2018-01-19 LAB — CBC WITH DIFFERENTIAL/PLATELET
BASOS PCT: 0.6 % (ref 0.0–3.0)
Basophils Absolute: 0 10*3/uL (ref 0.0–0.1)
EOS PCT: 3.8 % (ref 0.0–5.0)
Eosinophils Absolute: 0.2 10*3/uL (ref 0.0–0.7)
HCT: 44.8 % (ref 39.0–52.0)
Hemoglobin: 15.1 g/dL (ref 13.0–17.0)
LYMPHS ABS: 1.2 10*3/uL (ref 0.7–4.0)
Lymphocytes Relative: 22.6 % (ref 12.0–46.0)
MCHC: 33.7 g/dL (ref 30.0–36.0)
MCV: 93 fl (ref 78.0–100.0)
MONO ABS: 0.6 10*3/uL (ref 0.1–1.0)
MONOS PCT: 10.9 % (ref 3.0–12.0)
NEUTROS PCT: 62.1 % (ref 43.0–77.0)
Neutro Abs: 3.4 10*3/uL (ref 1.4–7.7)
Platelets: 262 10*3/uL (ref 150.0–400.0)
RBC: 4.82 Mil/uL (ref 4.22–5.81)
RDW: 12.7 % (ref 11.5–15.5)
WBC: 5.5 10*3/uL (ref 4.0–10.5)

## 2018-01-19 LAB — TSH: TSH: 2.88 u[IU]/mL (ref 0.35–4.50)

## 2018-01-19 LAB — HEMOGLOBIN A1C: Hgb A1c MFr Bld: 6 % (ref 4.6–6.5)

## 2018-01-19 NOTE — Patient Instructions (Signed)
Start checking your blood pressure and heart rate once a day at home (see handout given today). Write these numbers down and bring them in for review with me in 1 month.   DASH Eating Plan DASH stands for "Dietary Approaches to Stop Hypertension." The DASH eating plan is a healthy eating plan that has been shown to reduce high blood pressure (hypertension). It may also reduce your risk for type 2 diabetes, heart disease, and stroke. The DASH eating plan may also help with weight loss. What are tips for following this plan? General guidelines  Avoid eating more than 2,300 mg (milligrams) of salt (sodium) a day. If you have hypertension, you may need to reduce your sodium intake to 1,500 mg a day.  Limit alcohol intake to no more than 1 drink a day for nonpregnant women and 2 drinks a day for men. One drink equals 12 oz of beer, 5 oz of wine, or 1 oz of hard liquor.  Work with your health care provider to maintain a healthy body weight or to lose weight. Ask what an ideal weight is for you.  Get at least 30 minutes of exercise that causes your heart to beat faster (aerobic exercise) most days of the week. Activities may include walking, swimming, or biking.  Work with your health care provider or diet and nutrition specialist (dietitian) to adjust your eating plan to your individual calorie needs. Reading food labels  Check food labels for the amount of sodium per serving. Choose foods with less than 5 percent of the Daily Value of sodium. Generally, foods with less than 300 mg of sodium per serving fit into this eating plan.  To find whole grains, look for the word "whole" as the first word in the ingredient list. Shopping  Buy products labeled as "low-sodium" or "no salt added."  Buy fresh foods. Avoid canned foods and premade or frozen meals. Cooking  Avoid adding salt when cooking. Use salt-free seasonings or herbs instead of table salt or sea salt. Check with your health care  provider or pharmacist before using salt substitutes.  Do not fry foods. Cook foods using healthy methods such as baking, boiling, grilling, and broiling instead.  Cook with heart-healthy oils, such as olive, canola, soybean, or sunflower oil. Meal planning   Eat a balanced diet that includes: ? 5 or more servings of fruits and vegetables each day. At each meal, try to fill half of your plate with fruits and vegetables. ? Up to 6-8 servings of whole grains each day. ? Less than 6 oz of lean meat, poultry, or fish each day. A 3-oz serving of meat is about the same size as a deck of cards. One egg equals 1 oz. ? 2 servings of low-fat dairy each day. ? A serving of nuts, seeds, or beans 5 times each week. ? Heart-healthy fats. Healthy fats called Omega-3 fatty acids are found in foods such as flaxseeds and coldwater fish, like sardines, salmon, and mackerel.  Limit how much you eat of the following: ? Canned or prepackaged foods. ? Food that is high in trans fat, such as fried foods. ? Food that is high in saturated fat, such as fatty meat. ? Sweets, desserts, sugary drinks, and other foods with added sugar. ? Full-fat dairy products.  Do not salt foods before eating.  Try to eat at least 2 vegetarian meals each week.  Eat more home-cooked food and less restaurant, buffet, and fast food.  When eating at a  restaurant, ask that your food be prepared with less salt or no salt, if possible. What foods are recommended? The items listed may not be a complete list. Talk with your dietitian about what dietary choices are best for you. Grains Whole-grain or whole-wheat bread. Whole-grain or whole-wheat pasta. Brown rice. Carlos James. Bulgur. Whole-grain and low-sodium cereals. Pita bread. Low-fat, low-sodium crackers. Whole-wheat flour tortillas. Vegetables Fresh or frozen vegetables (raw, steamed, roasted, or grilled). Low-sodium or reduced-sodium tomato and vegetable juice. Low-sodium or  reduced-sodium tomato sauce and tomato paste. Low-sodium or reduced-sodium canned vegetables. Fruits All fresh, dried, or frozen fruit. Canned fruit in natural juice (without added sugar). Meat and other protein foods Skinless chicken or [place]. Ground chicken or [place]. Pork with fat trimmed off. Fish and seafood. Egg whites. Dried beans, peas, or lentils. Unsalted nuts, nut butters, and seeds. Unsalted canned beans. Lean cuts of beef with fat trimmed off. Low-sodium, lean deli meat. Dairy Low-fat (1%) or fat-free (skim) milk. Fat-free, low-fat, or reduced-fat cheeses. Nonfat, low-sodium ricotta or cottage cheese. Low-fat or nonfat yogurt. Low-fat, low-sodium cheese. Fats and oils Soft margarine without trans fats. Vegetable oil. Low-fat, reduced-fat, or light mayonnaise and salad dressings (reduced-sodium). Canola, safflower, olive, soybean, and sunflower oils. Avocado. Seasoning and other foods Herbs. Spices. Seasoning mixes without salt. Unsalted popcorn and pretzels. Fat-free sweets. What foods are not recommended? The items listed may not be a complete list. Talk with your dietitian about what dietary choices are best for you. Grains Baked goods made with fat, such as croissants, muffins, or some breads. Dry pasta or rice meal packs. Vegetables Creamed or fried vegetables. Vegetables in a cheese sauce. Regular canned vegetables (not low-sodium or reduced-sodium). Regular canned tomato sauce and paste (not low-sodium or reduced-sodium). Regular tomato and vegetable juice (not low-sodium or reduced-sodium). Carlos James. Olives. Fruits Canned fruit in a light or heavy syrup. Fried fruit. Fruit in cream or butter sauce. Meat and other protein foods Fatty cuts of meat. Ribs. Fried meat. Carlos James. Sausage. Bologna and other processed lunch meats. Salami. Fatback. Hotdogs. Bratwurst. Salted nuts and seeds. Canned beans with added salt. Canned or smoked fish. Whole eggs or egg yolks. Chicken or [place]  with skin. Dairy Whole or 2% milk, cream, and half-and-half. Whole or full-fat cream cheese. Whole-fat or sweetened yogurt. Full-fat cheese. Nondairy creamers. Whipped toppings. Processed cheese and cheese spreads. Fats and oils Butter. Stick margarine. Lard. Shortening. Ghee. Bacon fat. Tropical oils, such as coconut, palm kernel, or palm oil. Seasoning and other foods Salted popcorn and pretzels. Onion salt, garlic salt, seasoned salt, table salt, and sea salt. Worcestershire sauce. Tartar sauce. Barbecue sauce. Teriyaki sauce. Soy sauce, including reduced-sodium. Steak sauce. Canned and packaged gravies. Fish sauce. Oyster sauce. Cocktail sauce. Horseradish that you find on the shelf. Ketchup. Mustard. Meat flavorings and tenderizers. Bouillon cubes. Hot sauce and Tabasco sauce. Premade or packaged marinades. Premade or packaged taco seasonings. Relishes. Regular salad dressings. Where to find more information:  National Heart, Lung, and [place]: https://wilson-eaton.com/  American Heart Association: www.heart.org Summary  The DASH eating plan is a healthy eating plan that has been shown to reduce high blood pressure (hypertension). It may also reduce your risk for type 2 diabetes, heart disease, and stroke.  With the DASH eating plan, you should limit salt (sodium) intake to 2,300 mg a day. If you have hypertension, you may need to reduce your sodium intake to 1,500 mg a day.  When on the DASH eating plan, aim to eat more  fresh fruits and vegetables, whole grains, lean proteins, low-fat dairy, and heart-healthy fats.  Work with your health care provider or diet and nutrition specialist (dietitian) to adjust your eating plan to your individual calorie needs. This information is not intended to replace advice given to you by your health care provider. Make sure you discuss any questions you have with your health care provider. Document Released: 06/10/2011 Document Revised: 06/14/2016  Document Reviewed: 06/14/2016 Elsevier Interactive Patient Education  [employer].

## 2018-01-19 NOTE — Progress Notes (Signed)
Office Note 01/19/2018  CC:  Chief Complaint  Patient presents with  . Annual Exam    Pt is fasting.     HPI:  Carlos James is a 42 y.o. White male who is here for annual health maintenance exam. He has HTN, HLD, IFG  Exercise: very active in job, no formal exercise. Diet: 6 beers per day, sometimes more. Not eating like he should.  HTN: no home bp monitoring.  Compliant with meds daily.  Feeling very well.  Past Medical History:  Diagnosis Date  . Atypical chest pain since 2007   w/u has included a CT angio chest (neg), myoview (normal), EGD  . Delayed gastric emptying   . GERD (gastroesophageal reflux disease)   . Hyperlipidemia    Intol of atorv, rosuva, lovalo, and zetia.  Good on pravastatin.  Marland Kitchen Hypertension   . IFG (impaired fasting glucose) 2016   HbA1c 5.6%.  Repeat was 5.5% 01/2016.  Repeat 03/2017 5.8%.  . Insomnia     Past Surgical History:  Procedure Laterality Date  . CARDIOVASCULAR STRESS TEST  07/2011   Normal, excellent exercise capacity  . gastric emptying scan  2007   Delayed--reglan started    Family History  Problem Relation Age of Onset  . Coronary artery disease Unknown   . Arthritis Mother   . Heart disease Mother   . Hyperlipidemia Mother   . Arthritis Father   . Heart disease Father   . Hyperlipidemia Father     Social History   Socioeconomic History  . Marital status: Married    Spouse name: Not on file  . Number of children: Not on file  . Years of education: Not on file  . Highest education level: Not on file  Occupational History  . Not on file  Social Needs  . Financial resource strain: Not on file  . Food insecurity:    Worry: Not on file    Inability: Not on file  . Transportation needs:    Medical: Not on file    Non-medical: Not on file  Tobacco Use  . Smoking status: Former Research scientist (life sciences)  . Smokeless tobacco: Current User  . Tobacco comment: uses 1 can a day, was a periodic smoker, but not now.  Substance  and Sexual Activity  . Alcohol use: Yes  . Drug use: No  . Sexual activity: Yes  Lifestyle  . Physical activity:    Days per week: Not on file    Minutes per session: Not on file  . Stress: Not on file  Relationships  . Social connections:    Talks on phone: Not on file    Gets together: Not on file    Attends religious service: Not on file    Active member of club or organization: Not on file    Attends meetings of clubs or organizations: Not on file    Relationship status: Not on file  . Intimate partner violence:    Fear of current or ex partner: Not on file    Emotionally abused: Not on file    Physically abused: Not on file    Forced sexual activity: Not on file  Other Topics Concern  . Not on file  Social History Narrative   Married, 2 chldren.   Occupation:    Also owns a Technical sales engineer business.   Orig from [place].      Regular exercise: at [employer].   He dips.  No smoking.  Alcohol intake: 3 beers a  day on his days off.             Outpatient Medications Prior to Visit  Medication Sig Dispense Refill  . amLODipine (NORVASC) 5 MG tablet TAKE 1/2 TABLET BY MOUTH 2 TIMES DAILY. 180 tablet 1  . esomeprazole (NEXIUM) 40 MG capsule TAKE ONE CAPSULE BY MOUTH TWICE A DAY 180 capsule 3  . irbesartan-hydrochlorothiazide (AVALIDE) 150-12.5 MG tablet TAKE 1 TABLET BY MOUTH EVERY DAY 90 tablet 0  . metoCLOPramide (REGLAN) 10 MG tablet TAKE 1 TABLET TWICE DAILY 180 tablet 1  . pravastatin (PRAVACHOL) 40 MG tablet TAKE 1 TABLET BY MOUTH EVERY DAY 90 tablet 0  . scopolamine (TRANSDERM-SCOP) 1 MG/3DAYS Place 1 patch (1.5 mg total) onto the skin every 3 (three) days. (Patient not taking: Reported on 01/19/2018) 4 patch 1   No facility-administered medications prior to visit.     Allergies  Allergen Reactions  . Ciprofloxacin     REACTION: Nausea    ROS Review of Systems  Constitutional: Negative for appetite change, chills, fatigue and fever.  HENT: Negative for  congestion, dental problem, ear pain and sore throat.   Eyes: Negative for discharge, redness and visual disturbance.  Respiratory: Negative for cough, chest tightness, shortness of breath and wheezing.   Cardiovascular: Negative for chest pain, palpitations and leg swelling.  Gastrointestinal: Negative for abdominal pain, blood in stool, diarrhea, nausea and vomiting.  Genitourinary: Negative for difficulty urinating, dysuria, flank pain, frequency, hematuria and urgency.  Musculoskeletal: Negative for arthralgias, back pain, joint swelling, myalgias and neck stiffness.  Skin: Negative for pallor and rash.  Neurological: Negative for dizziness, speech difficulty, weakness and headaches.  Hematological: Negative for adenopathy. Does not bruise/bleed easily.  Psychiatric/Behavioral: Negative for confusion and sleep disturbance. The patient is not nervous/anxious.     PE; Initial bp 159/93, f/u with manual cuff at end of visit is 160/96. Blood pressure (!) 150/106, pulse 92, temperature 98.3 F (36.8 C), temperature source Oral, resp. rate 16, height 5' 10.5" (1.791 m), weight 248 lb 6 oz (112.7 kg), SpO2 94 %. Body mass index is 35.13 kg/m.  Gen: Alert, well appearing.  Patient is oriented to person, place, time, and situation. AFFECT: pleasant, lucid thought and speech. ENT: Ears: EACs clear, normal epithelium.  TMs with good light reflex and landmarks bilaterally.  Eyes: no injection, icteris, swelling, or exudate.  EOMI, PERRLA. Nose: no drainage or turbinate edema/swelling.  No injection or focal lesion.  Mouth: lips without lesion/swelling.  Oral mucosa pink and moist.  Dentition intact and without obvious caries or gingival swelling.  Oropharynx without erythema, exudate, or swelling.  Neck: supple/nontender.  No LAD, mass, or TM.  Carotid pulses 2+ bilaterally, without bruits. CV: RRR, no m/r/g.   LUNGS: CTA bilat, nonlabored resps, good aeration in all lung fields. ABD: soft, NT, ND,  BS normal.  No hepatospenomegaly or mass.  No bruits. EXT: no clubbing, cyanosis, or edema.  Musculoskeletal: no joint swelling, erythema, warmth, or tenderness.  ROM of all joints intact. Skin - no sores or suspicious lesions or rashes or color changes   Pertinent labs:  Lab Results  Component Value Date   TSH 2.36 01/07/2016   Lab Results  Component Value Date   WBC 8.6 11/15/2016   HGB 14.8 11/15/2016   HCT 43.3 11/15/2016   MCV 90.7 11/15/2016   PLT 237.0 11/15/2016   Lab Results  Component Value Date   CREATININE 0.86 03/17/2017   BUN 12 03/17/2017   NA  141 03/17/2017   K 4.2 03/17/2017   CL 104 03/17/2017   CO2 28 03/17/2017   Lab Results  Component Value Date   ALT 42 03/17/2017   AST 30 03/17/2017   ALKPHOS 90 03/17/2017   BILITOT 0.5 03/17/2017   Lab Results  Component Value Date   CHOL 200 03/17/2017   Lab Results  Component Value Date   HDL 77.30 03/17/2017   Lab Results  Component Value Date   LDLCALC 107 (H) 03/17/2017   Lab Results  Component Value Date   TRIG 77.0 03/17/2017   Lab Results  Component Value Date   CHOLHDL 3 03/17/2017   Lab Results  Component Value Date   HGBA1C 5.8 03/17/2017    ASSESSMENT AND PLAN:   1) HTN: bp up signif here.  No home bp/hr data for comparison. He has a cuff.  Instructions: Start checking your blood pressure and heart rate once a day at home (see handout given today). Write these numbers down and bring them in for review with me in 1 month.   2) Health maintenance exam: Reviewed age and gender appropriate health maintenance issues (prudent diet, regular exercise, health risks of tobacco and excessive alcohol, use of seatbelts, fire alarms in home, use of sunscreen).  Also reviewed age and gender appropriate health screening as well as vaccine recommendations. Vaccines: vaccines UTD. Labs: fasting HP + HbA1c (IFG). Prostate ca screening: average risk patient= start screening at age 11 yrs. Colon  ca screening:average risk patient= start screening at age 59 yrs.  An After Visit Summary was printed and given to the patient.  FOLLOW UP:  Return in about 1 month (around 02/16/2018) for f/u HTN.  Signed:  Crissie Sickles, MD           01/19/2018

## 2018-01-20 ENCOUNTER — Encounter: Payer: Self-pay | Admitting: *Deleted

## 2018-02-18 ENCOUNTER — Other Ambulatory Visit: Payer: Self-pay | Admitting: Family Medicine

## 2018-02-27 ENCOUNTER — Other Ambulatory Visit: Payer: Self-pay | Admitting: Family Medicine

## 2018-03-01 ENCOUNTER — Ambulatory Visit: Payer: 59 | Admitting: Family Medicine

## 2018-03-14 ENCOUNTER — Encounter: Payer: Self-pay | Admitting: Family Medicine

## 2018-03-14 ENCOUNTER — Ambulatory Visit (INDEPENDENT_AMBULATORY_CARE_PROVIDER_SITE_OTHER): Payer: 59 | Admitting: Family Medicine

## 2018-03-14 VITALS — BP 138/95 | HR 78 | Temp 98.0°F | Resp 16 | Ht 70.5 in | Wt 244.5 lb

## 2018-03-14 DIAGNOSIS — I10 Essential (primary) hypertension: Secondary | ICD-10-CM | POA: Diagnosis not present

## 2018-03-14 DIAGNOSIS — E669 Obesity, unspecified: Secondary | ICD-10-CM

## 2018-03-14 MED ORDER — IRBESARTAN-HYDROCHLOROTHIAZIDE 150-12.5 MG PO TABS: ORAL_TABLET | ORAL | 5 refills | Status: DC

## 2018-03-14 NOTE — Progress Notes (Signed)
OFFICE VISIT  03/14/2018   CC:  Chief Complaint  Patient presents with  . Follow-up    HTN, pt is fasting.    HPI:    Patient is a 42 y.o.  male who presents for f/u HTN. Last visit about 2 mo ago for CPE-->BP signif elevated in office, but pt had no home bp measurements to compare to and we chose to do some home monitoring of bp and f/u to review them instead of adding bp med at that time.  Occ bp check since last visit "130s over 90s" and "sometimes a little better".  He brings no numbers with him today. No change in his diet recently, says he is trying to walk more, has lost 4 lbs since last visit.  We discussed recent data regarding ASA use for primary prevention-->no longer indicated for this purpose. Presented pros and cons of use for him.   Past Medical History:  Diagnosis Date  . Atypical chest pain since 2007   w/u has included a CT angio chest (neg), myoview (normal), EGD  . Delayed gastric emptying   . GERD (gastroesophageal reflux disease)   . Hyperlipidemia    Intol of atorv, rosuva, lovalo, and zetia.  Good on pravastatin.  Marland Kitchen Hypertension   . IFG (impaired fasting glucose) 2016   HbA1c 5.6%.  Repeat was 5.5% 01/2016.  Repeat 03/2017 5.8%.  01/2018 A1c 6.0%.  . Insomnia     Past Surgical History:  Procedure Laterality Date  . CARDIOVASCULAR STRESS TEST  07/2011   Normal, excellent exercise capacity  . gastric emptying scan  2007   Delayed--reglan started    Outpatient Medications Prior to Visit  Medication Sig Dispense Refill  . amLODipine (NORVASC) 5 MG tablet TAKE 1/2 TABLET BY MOUTH 2 TIMES DAILY. 180 tablet 1  . aspirin EC 81 MG tablet Take 81 mg by mouth daily.    Marland Kitchen esomeprazole (NEXIUM) 40 MG capsule TAKE ONE CAPSULE BY MOUTH TWICE A DAY 180 capsule 3  . metoCLOPramide (REGLAN) 10 MG tablet TAKE 1 TABLET TWICE DAILY 180 tablet 1  . Multiple Vitamin (MULTIVITAMIN) tablet Take 1 tablet by mouth daily.    . pravastatin (PRAVACHOL) 40 MG tablet TAKE 1  TABLET BY MOUTH EVERY DAY 90 tablet 0  . irbesartan-hydrochlorothiazide (AVALIDE) 150-12.5 MG tablet TAKE 1 TABLET BY MOUTH EVERY DAY. NEED OFFICE VISIT FOR REFILLS 30 tablet 0   No facility-administered medications prior to visit.     Allergies  Allergen Reactions  . Ciprofloxacin     REACTION: Nausea    ROS As per HPI  PE: Blood pressure (!) 138/95, pulse 78, temperature 98 F (36.7 C), temperature source Oral, resp. rate 16, height 5' 10.5" (1.791 m), weight 244 lb 8 oz (110.9 kg), SpO2 96 %. Gen: Alert, well appearing.  Patient is oriented to person, place, time, and situation. AFFECT: pleasant, lucid thought and speech. CV: RRR, no m/r/g.   LUNGS: CTA bilat, nonlabored resps, good aeration in all lung fields. EXT; no clubbing or cyanosis. Trace bilat pitting edema.  LABS:  Lab Results  Component Value Date   TSH 2.88 01/19/2018   Lab Results  Component Value Date   WBC 5.5 01/19/2018   HGB 15.1 01/19/2018   HCT 44.8 01/19/2018   MCV 93.0 01/19/2018   PLT 262.0 01/19/2018   Lab Results  Component Value Date   CREATININE 0.84 01/19/2018   BUN 14 01/19/2018   NA 142 01/19/2018   K 4.5 01/19/2018  CL 104 01/19/2018   CO2 29 01/19/2018   Lab Results  Component Value Date   ALT 53 01/19/2018   AST 43 (H) 01/19/2018   ALKPHOS 100 01/19/2018   BILITOT 0.4 01/19/2018   Lab Results  Component Value Date   CHOL 201 (H) 01/19/2018   Lab Results  Component Value Date   HDL 65.90 01/19/2018   Lab Results  Component Value Date   LDLCALC 112 (H) 01/19/2018   Lab Results  Component Value Date   TRIG 118.0 01/19/2018   Lab Results  Component Value Date   CHOLHDL 3 01/19/2018   Lab Results  Component Value Date   HGBA1C 6.0 01/19/2018    IMPRESSION AND PLAN:  1) Uncontrolled HTN: his bp's are mildly high and I recommended he increase his hctz/irbes 12.5/150 to 1 and 1/2 tabs po qd.  Encouraged a bit better home bp monitoring. Increase exercise and  efforts at wt loss. Discussed ASA-->not indicated for primary CV prevention as per latest research/guidelines. Pt to consider this. No labs needed today.  An After Visit Summary was printed and given to the patient.  FOLLOW UP: Return in about 4 months (around 07/14/2018) for routine chronic illness f/u.  Signed:  Crissie Sickles, MD           03/14/2018

## 2018-04-30 ENCOUNTER — Other Ambulatory Visit: Payer: Self-pay | Admitting: Family Medicine

## 2018-05-24 ENCOUNTER — Other Ambulatory Visit: Payer: Self-pay | Admitting: Family Medicine

## 2018-07-14 ENCOUNTER — Ambulatory Visit: Payer: 59 | Admitting: Family Medicine

## 2018-07-30 ENCOUNTER — Other Ambulatory Visit: Payer: Self-pay | Admitting: Family Medicine

## 2018-07-30 DIAGNOSIS — I10 Essential (primary) hypertension: Secondary | ICD-10-CM

## 2018-08-03 ENCOUNTER — Ambulatory Visit: Payer: 59 | Admitting: Family Medicine

## 2018-08-21 ENCOUNTER — Encounter: Payer: Self-pay | Admitting: Family Medicine

## 2018-08-21 ENCOUNTER — Ambulatory Visit: Payer: 59 | Admitting: Family Medicine

## 2018-08-21 NOTE — Progress Notes (Deleted)
OFFICE VISIT  08/21/2018   CC: No chief complaint on file.    HPI:    Patient is a 43 y.o. Caucasian male who presents for 5 mo f/u HTN, prediabetes, and hypercholesterolemia.  Last visit I increased his hctz/irbes 12.5/150 to 1 and 1/2 tabs po qd.   Past Medical History:  Diagnosis Date  . Atypical chest pain since 2007   w/u has included a CT angio chest (neg), myoview (normal), EGD  . Delayed gastric emptying   . GERD (gastroesophageal reflux disease)   . Hyperlipidemia    Intol of atorv, rosuva, lovalo, and zetia.  Good on pravastatin.  Marland Kitchen Hypertension   . IFG (impaired fasting glucose) 2016   HbA1c 5.6%.  Repeat was 5.5% 01/2016.  Repeat 03/2017 5.8%.  01/2018 A1c 6.0%.  . Insomnia     Past Surgical History:  Procedure Laterality Date  . CARDIOVASCULAR STRESS TEST  07/2011   Normal, excellent exercise capacity  . gastric emptying scan  2007   Delayed--reglan started    Outpatient Medications Prior to Visit  Medication Sig Dispense Refill  . amLODipine (NORVASC) 5 MG tablet TAKE 1/2 TABLET BY MOUTH 2 TIMES DAILY. 90 tablet 3  . aspirin EC 81 MG tablet Take 81 mg by mouth daily.    Marland Kitchen esomeprazole (NEXIUM) 40 MG capsule TAKE ONE CAPSULE BY MOUTH TWICE A DAY 180 capsule 3  . irbesartan-hydrochlorothiazide (AVALIDE) 150-12.5 MG tablet 1 and 1/2 tabs po qd 45 tablet 5  . metoCLOPramide (REGLAN) 10 MG tablet TAKE 1 TABLET BY MOUTH TWICE A DAY 180 tablet 1  . Multiple Vitamin (MULTIVITAMIN) tablet Take 1 tablet by mouth daily.    . pravastatin (PRAVACHOL) 40 MG tablet TAKE 1 TABLET BY MOUTH EVERY DAY 90 tablet 1   No facility-administered medications prior to visit.     Allergies  Allergen Reactions  . Ciprofloxacin     REACTION: Nausea    ROS As per HPI  PE: There were no vitals taken for this visit. ***  LABS:    Chemistry      Component Value Date/Time   NA 142 01/19/2018 0816   K 4.5 01/19/2018 0816   CL 104 01/19/2018 0816   CO2 29 01/19/2018 0816   BUN 14 01/19/2018 0816   CREATININE 0.84 01/19/2018 0816      Component Value Date/Time   CALCIUM 9.7 01/19/2018 0816   ALKPHOS 100 01/19/2018 0816   AST 43 (H) 01/19/2018 0816   ALT 53 01/19/2018 0816   BILITOT 0.4 01/19/2018 0816     Lab Results  Component Value Date   HGBA1C 6.0 01/19/2018   Lab Results  Component Value Date   CHOL 201 (H) 01/19/2018   HDL 65.90 01/19/2018   LDLCALC 112 (H) 01/19/2018   LDLDIRECT 176.7 07/12/2013   TRIG 118.0 01/19/2018   CHOLHDL 3 01/19/2018     IMPRESSION AND PLAN:  No problem-specific Assessment & Plan notes found for this encounter.   An After Visit Summary was printed and given to the patient.  FOLLOW UP: No follow-ups on file.  Signed:  Crissie Sickles, MD           08/21/2018

## 2018-09-21 ENCOUNTER — Telehealth: Payer: Self-pay | Admitting: Family Medicine

## 2018-09-21 DIAGNOSIS — I10 Essential (primary) hypertension: Secondary | ICD-10-CM

## 2018-09-21 MED ORDER — IRBESARTAN-HYDROCHLOROTHIAZIDE 150-12.5 MG PO TABS: ORAL_TABLET | ORAL | 0 refills | Status: DC

## 2018-09-21 NOTE — Telephone Encounter (Signed)
Copied from [place] [phone]. Topic: Quick Communication - Rx Refill/Question >> Sep 21, 2018  8:03 AM Burchel, Abbi R wrote: Medication: irbesartan-hydrochlorothiazide (AVALIDE) 150-12.5 MG tablet, amLODipine (NORVASC) 5 MG tablet  Preferred Pharmacy: [id number] [place], [place] - [address] [place] [address]Phone: [phone]: [phone]   Pt was advised that RX refills may take up to 3 business days. We ask that you follow-up with your pharmacy.

## 2018-09-21 NOTE — Telephone Encounter (Signed)
Per our records pt should have refills on file with his pharmacy for amlodipine. (Rx was sent on 07/31/18 #90 w/ 3RF).  I will send Rx for irbesartan/hctz.

## 2018-10-15 ENCOUNTER — Other Ambulatory Visit: Payer: Self-pay | Admitting: Family Medicine

## 2018-11-15 ENCOUNTER — Other Ambulatory Visit: Payer: Self-pay | Admitting: Family Medicine

## 2018-11-24 ENCOUNTER — Encounter: Payer: Self-pay | Admitting: Family Medicine

## 2018-11-24 ENCOUNTER — Ambulatory Visit (INDEPENDENT_AMBULATORY_CARE_PROVIDER_SITE_OTHER): Payer: 59 | Admitting: Family Medicine

## 2018-11-24 VITALS — BP 126/78 | HR 95

## 2018-11-24 DIAGNOSIS — R7303 Prediabetes: Secondary | ICD-10-CM

## 2018-11-24 DIAGNOSIS — I10 Essential (primary) hypertension: Secondary | ICD-10-CM

## 2018-11-24 DIAGNOSIS — E7800 Pure hypercholesterolemia, unspecified: Secondary | ICD-10-CM

## 2018-11-24 NOTE — Progress Notes (Signed)
Virtual Visit via Video Note  I connected with pt on 11/24/18 at  2:00 PM EDT by a video enabled telemedicine application and verified that I am speaking with the correct person using two identifiers.  Location patient: home Location provider:work or home office Persons participating in the virtual visit: patient, provider  I discussed the limitations of evaluation and management by telemedicine and the availability of in person appointments. The patient expressed understanding and agreed to proceed.  Telemedicine visit is a necessity given the COVID-19 restrictions in place at the current time.  HPI: 43 y/o WM being seen today for f/u HTN, HLD, and prediabetes. He feels well and has no complaints today.  HTN: last visit 9 mo ago we increased his irbesartan-hctz 150-12.5 to 1 and 1/2 tabs qd and kept him on amlodipine 66m 1/2 tab bid.  He was unable to f/u as directed 4 mo later.  I think some apprehension surround covid 19 was playing a role. Reports bp's have been 120s/70s consistently since I last saw him.  HLD: hx of statin intol but has tolerated pravastatin fine.  Prediabetes: A1c has slowly creeped up last few years, was 6.0% at his last check 9 mo ago. He is working on lower cabs, lower fats, smaller portion sizes.  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   ROS: no CP, no SOB, no wheezing, no cough, no dizziness, no HAs, no rashes, no melena/hematochezia.  No polyuria or polydipsia.  No myalgias or arthralgias.   Past Medical History:  Diagnosis Date  . Atypical chest pain since 2007   w/u has included a CT angio chest (neg), myoview (normal), EGD  . Delayed gastric emptying   . GERD (gastroesophageal reflux disease)   . Hyperlipidemia    Intol of atorv, rosuva, lovalo, and zetia.  Good on pravastatin.  .Marland KitchenHypertension   . IFG (impaired fasting glucose) 2016   HbA1c 5.6%.  Repeat was 5.5% 01/2016.  Repeat 03/2017 5.8%.  01/2018 A1c 6.0%.  . Insomnia     Past  Surgical History:  Procedure Laterality Date  . CARDIOVASCULAR STRESS TEST  07/2011   Normal, excellent exercise capacity  . gastric emptying scan  2007   Delayed--reglan started    Family History  Problem Relation Age of Onset  . Coronary artery disease Other   . Arthritis Mother   . Heart disease Mother   . Hyperlipidemia Mother   . Arthritis Father   . Heart disease Father   . 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  Some beer on days off work.   Current Outpatient Medications:  .  amLODipine (NORVASC) 5 MG tablet, TAKE 1/2 TABLET BY MOUTH 2 TIMES DAILY., Disp: 90 tablet, Rfl: 3 .  aspirin EC 81 MG tablet, Take 81 mg by mouth daily., Disp: , Rfl:  .  esomeprazole (NEXIUM) 40 MG capsule, TAKE ONE CAPSULE BY MOUTH TWICE A DAY, Disp: 180 capsule, Rfl: 3 .  irbesartan-hydrochlorothiazide (AVALIDE) 150-12.5 MG tablet, TAKE 1 AND 1/2 TABLETS BY MOUTH ONCE DAILY. OFFICE VISIT NEEDED, Disp: 45 tablet, Rfl: 0 .  metoCLOPramide (REGLAN) 10 MG tablet, TAKE 1 TABLET BY MOUTH TWICE A DAY, Disp: 180 tablet, Rfl: 1 .  Multiple Vitamin (MULTIVITAMIN) tablet, Take 1 tablet by mouth daily., Disp: , Rfl:  .  pravastatin (PRAVACHOL) 40 MG tablet, TAKE 1 TABLET BY MOUTH EVERY DAY, Disp: 90 tablet,  Rfl: 1  EXAM:  VITALS per patient if applicable: BP 161/09 (BP Location: Right Arm, Patient Position: Sitting, Cuff Size: Large)   Pulse 95    GENERAL: alert, oriented, appears well and in no acute distress  HEENT: atraumatic, conjunttiva clear, no obvious abnormalities on inspection of external nose and ears  NECK: normal movements of the head and neck  LUNGS: on inspection no signs of respiratory distress, breathing rate appears normal, no obvious gross SOB, gasping or wheezing  CV: no obvious cyanosis  MS: moves all visible extremities without noticeable abnormality  PSYCH/NEURO: pleasant and  cooperative, no obvious depression or anxiety, speech and thought processing grossly intact  LABS: none today    Chemistry      Component Value Date/Time   NA 142 01/19/2018 0816   K 4.5 01/19/2018 0816   CL 104 01/19/2018 0816   CO2 29 01/19/2018 0816   BUN 14 01/19/2018 0816   CREATININE 0.84 01/19/2018 0816      Component Value Date/Time   CALCIUM 9.7 01/19/2018 0816   ALKPHOS 100 01/19/2018 0816   AST 43 (H) 01/19/2018 0816   ALT 53 01/19/2018 0816   BILITOT 0.4 01/19/2018 0816     Lab Results  Component Value Date   CHOL 201 (H) 01/19/2018   HDL 65.90 01/19/2018   LDLCALC 112 (H) 01/19/2018   LDLDIRECT 176.7 07/12/2013   TRIG 118.0 01/19/2018   CHOLHDL 3 01/19/2018   Lab Results  Component Value Date   HGBA1C 6.0 01/19/2018   ASSESSMENT AND PLAN:  Discussed the following assessment and plan:  1) HTN: The current medical regimen is effective;  continue present plan and medications. Lytes/cr -future  2) Hyperlipidemia: tolerating pravastatin 47m qd. FLP and hepatic panel-future.  3) Prediabetes.  Working on lower carbs/lower fats and improved exercise. HbA1c-future.  I discussed the assessment and treatment plan with the patient. The patient was provided an opportunity to ask questions and all were answered. The patient agreed with the plan and demonstrated an understanding of the instructions.   The patient was advised to call back or seek an in-person evaluation if the symptoms worsen or if the condition fails to improve as anticipated.  F/u: 4 mo cpe  Signed:  PCrissie Sickles MD           11/24/2018

## 2018-11-29 ENCOUNTER — Other Ambulatory Visit: Payer: Self-pay

## 2018-11-29 MED ORDER — IRBESARTAN-HYDROCHLOROTHIAZIDE 150-12.5 MG PO TABS: ORAL_TABLET | ORAL | 0 refills | Status: DC

## 2018-12-18 ENCOUNTER — Ambulatory Visit: Payer: 59

## 2019-01-17 ENCOUNTER — Other Ambulatory Visit: Payer: Self-pay | Admitting: Family Medicine

## 2019-02-25 ENCOUNTER — Other Ambulatory Visit: Payer: Self-pay | Admitting: Family Medicine

## 2019-04-04 ENCOUNTER — Other Ambulatory Visit: Payer: Self-pay

## 2019-04-04 ENCOUNTER — Ambulatory Visit (INDEPENDENT_AMBULATORY_CARE_PROVIDER_SITE_OTHER): Payer: 59 | Admitting: Family Medicine

## 2019-04-04 ENCOUNTER — Encounter: Payer: Self-pay | Admitting: Family Medicine

## 2019-04-04 VITALS — BP 149/98 | HR 93 | Temp 99.2°F | Resp 16 | Ht 70.5 in | Wt 250.0 lb

## 2019-04-04 DIAGNOSIS — R7303 Prediabetes: Secondary | ICD-10-CM

## 2019-04-04 DIAGNOSIS — I10 Essential (primary) hypertension: Secondary | ICD-10-CM

## 2019-04-04 DIAGNOSIS — Z0000 Encounter for general adult medical examination without abnormal findings: Secondary | ICD-10-CM

## 2019-04-04 DIAGNOSIS — E7800 Pure hypercholesterolemia, unspecified: Secondary | ICD-10-CM

## 2019-04-04 DIAGNOSIS — Z23 Encounter for immunization: Secondary | ICD-10-CM

## 2019-04-04 MED ORDER — IRBESARTAN-HYDROCHLOROTHIAZIDE 300-12.5 MG PO TABS: 1.0000 | ORAL_TABLET | Freq: Every day | ORAL | 1 refills | Status: DC

## 2019-04-04 NOTE — Patient Instructions (Signed)
Check your blood pressure and heart rate daily and right the numbers down and bring them in for review with me in 3-4 weeks.  Health Maintenance, Male Adopting a healthy lifestyle and getting preventive care are important in promoting health and wellness. Ask your health care provider about:  The right schedule for you to have regular tests and exams.  Things you can do on your own to prevent diseases and keep yourself healthy. What should I know about diet, weight, and exercise? Eat a healthy diet   Eat a diet that includes plenty of vegetables, fruits, low-fat dairy products, and lean protein.  Do not eat a lot of foods that are high in solid fats, added sugars, or sodium. Maintain a healthy weight Body mass index (BMI) is a measurement that can be used to identify possible weight problems. It estimates body fat based on height and weight. Your health care provider can help determine your BMI and help you achieve or maintain a healthy weight. Get regular exercise Get regular exercise. This is one of the most important things you can do for your health. Most adults should:  Exercise for at least 150 minutes each week. The exercise should increase your heart rate and make you sweat (moderate-intensity exercise).  Do strengthening exercises at least twice a week. This is in addition to the moderate-intensity exercise.  Spend less time sitting. Even light physical activity can be beneficial. Watch cholesterol and blood lipids Have your blood tested for lipids and cholesterol at 44 years of age, then have this test every 5 years. You may need to have your cholesterol levels checked more often if:  Your lipid or cholesterol levels are high.  You are older than 44 years of age.  You are at high risk for heart disease. What should I know about cancer screening? Many types of cancers can be detected early and may often be prevented. Depending on your health history and family history, you  may need to have cancer screening at various ages. This may include screening for:  Colorectal cancer.  Prostate cancer.  Skin cancer.  Lung cancer. What should I know about heart disease, diabetes, and high blood pressure? Blood pressure and heart disease  High blood pressure causes heart disease and increases the risk of stroke. This is more likely to develop in people who have high blood pressure readings, are of African descent, or are overweight.  Talk with your health care provider about your target blood pressure readings.  Have your blood pressure checked: ? Every 3-5 years if you are 66-47 years of age. ? Every year if you are 62 years old or older.  If you are between the ages of 42 and 82 and are a current or former smoker, ask your health care provider if you should have a one-time screening for abdominal aortic aneurysm (AAA). Diabetes Have regular diabetes screenings. This checks your fasting blood sugar level. Have the screening done:  Once every three years after age 55 if you are at a normal weight and have a low risk for diabetes.  More often and at a younger age if you are overweight or have a high risk for diabetes. What should I know about preventing infection? Hepatitis B If you have a higher risk for hepatitis B, you should be screened for this virus. Talk with your health care provider to find out if you are at risk for hepatitis B infection. Hepatitis C Blood testing is recommended for:  Everyone  born from [birth year] through 1966.  Anyone with known risk factors for hepatitis C. Sexually transmitted infections (STIs)  You should be screened each year for STIs, including gonorrhea and chlamydia, if: ? You are sexually active and are younger than 44 years of age. ? You are older than 44 years of age and your health care provider tells you that you are at risk for this type of infection. ? Your sexual activity has changed since you were last screened, and you  are at increased risk for chlamydia or gonorrhea. Ask your health care provider if you are at risk.  Ask your health care provider about whether you are at high risk for HIV. Your health care provider may recommend a prescription medicine to help prevent HIV infection. If you choose to take medicine to prevent HIV, you should first get tested for HIV. You should then be tested every 3 months for as long as you are taking the medicine. Follow these instructions at home: Lifestyle  Do not use any products that contain nicotine or tobacco, such as cigarettes, e-cigarettes, and chewing tobacco. If you need help quitting, ask your health care provider.  Do not use street drugs.  Do not share needles.  Ask your health care provider for help if you need support or information about quitting drugs. Alcohol use  Do not drink alcohol if your health care provider tells you not to drink.  If you drink alcohol: ? Limit how much you have to 0-2 drinks a day. ? Be aware of how much alcohol is in your drink. In the [place] one drink equals one 12 oz bottle of beer (355 mL), one 5 oz glass of wine (148 mL), or one 1 oz glass of hard liquor (44 mL). General instructions  Schedule regular health, dental, and eye exams.  Stay current with your vaccines.  Tell your health care provider if: ? You often feel depressed. ? You have ever been abused or do not feel safe at home. Summary  Adopting a healthy lifestyle and getting preventive care are important in promoting health and wellness.  Follow your health care provider's instructions about healthy diet, exercising, and getting tested or screened for diseases.  Follow your health care provider's instructions on monitoring your cholesterol and blood pressure. This information is not intended to replace advice given to you by your health care provider. Make sure you discuss any questions you have with your health care provider. Document Released:  12/18/2007 Document Revised: 06/14/2018 Document Reviewed: 06/14/2018 Elsevier Patient Education  2020 [employer].

## 2019-04-04 NOTE — Progress Notes (Signed)
Office Note 04/04/2019  CC:  Chief Complaint  Patient presents with  . Annual Exam    pt is fasting    HPI:  Carlos James is a 43 y.o. White male who is here for annual health maintenance exam.  Home bp monitoring: 140s/80.  HR usually high 80s. His work is his exercise, going to get an elliptical. Diet: not clear but sounds like it's not too good.    Past Medical History:  Diagnosis Date  . Atypical chest pain since 2007   w/u has included a CT angio chest (neg), myoview (normal), EGD  . Delayed gastric emptying   . GERD (gastroesophageal reflux disease)   . Hyperlipidemia    Intol of atorv, rosuva, lovalo, and zetia.  Good on pravastatin.  Marland Kitchen Hypertension   . IFG (impaired fasting glucose) 2016   HbA1c 5.6%.  Repeat was 5.5% 01/2016.  Repeat 03/2017 5.8%.  01/2018 A1c 6.0%.  . Insomnia     Past Surgical History:  Procedure Laterality Date  . CARDIOVASCULAR STRESS TEST  07/2011   Normal, excellent exercise capacity  . gastric emptying scan  2007   Delayed--reglan started    Family History  Problem Relation Age of Onset  . Coronary artery disease Other   . Arthritis Mother   . Heart disease Mother   . Hyperlipidemia Mother   . Arthritis Father   . Heart disease Father   . Hyperlipidemia Father     Social History   Socioeconomic History  . Marital status: Married    Spouse name: Not on file  . Number of children: Not on file  . Years of education: Not on file  . Highest education level: Not on file  Occupational History  . Not on file  Social Needs  . Financial resource strain: Not on file  . Food insecurity    Worry: Not on file    Inability: Not on file  . Transportation needs    Medical: Not on file    Non-medical: Not on file  Tobacco Use  . Smoking status: Former Research scientist (life sciences)  . Smokeless tobacco: Current User  . Tobacco comment: uses 1 can a day, was a periodic smoker, but not now.  Substance and Sexual Activity  . Alcohol use: Yes  . Drug  use: No  . Sexual activity: Yes  Lifestyle  . Physical activity    Days per week: Not on file    Minutes per session: Not on file  . Stress: Not on file  Relationships  . Social Herbalist on phone: Not on file    Gets together: Not on file    Attends religious service: Not on file    Active member of club or organization: Not on file    Attends meetings of clubs or organizations: Not on file    Relationship status: Not on file  . Intimate partner violence    Fear of current or ex partner: Not on file    Emotionally abused: Not on file    Physically abused: Not on file    Forced sexual activity: Not on file  Other Topics Concern  . Not on file  Social History Narrative   Married, 2 chldren.   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   Orig from [place].   He dips.  No smoking.  Alcohol intake: 2-3 beers a day on his days off.             Outpatient Medications  Prior to Visit  Medication Sig Dispense Refill  . amLODipine (NORVASC) 5 MG tablet TAKE 1/2 TABLET BY MOUTH 2 TIMES DAILY. 90 tablet 3  . esomeprazole (NEXIUM) 40 MG capsule TAKE ONE CAPSULE BY MOUTH TWICE A DAY 180 capsule 3  . metoCLOPramide (REGLAN) 10 MG tablet TAKE 1 TABLET BY MOUTH TWICE A DAY 180 tablet 1  . Multiple Vitamin (MULTIVITAMIN) tablet Take 1 tablet by mouth daily.    . pravastatin (PRAVACHOL) 40 MG tablet TAKE 1 TABLET BY MOUTH EVERY DAY 90 tablet 0  . irbesartan-hydrochlorothiazide (AVALIDE) 150-12.5 MG tablet TAKE 1 AND 1/2 TABLETS BY MOUTH ONCE DAILY 135 tablet 1  . aspirin EC 81 MG tablet Take 81 mg by mouth daily.     No facility-administered medications prior to visit.     Allergies  Allergen Reactions  . Ciprofloxacin     REACTION: Nausea    ROS Review of Systems  Constitutional: Negative for appetite change, chills, fatigue and fever.  HENT: Negative for congestion, dental problem, ear pain and sore throat.   Eyes: Negative for discharge, redness and visual disturbance.   Respiratory: Negative for cough, chest tightness, shortness of breath and wheezing.   Cardiovascular: Negative for chest pain, palpitations and leg swelling.  Gastrointestinal: Negative for abdominal pain, blood in stool, diarrhea, nausea and vomiting.  Genitourinary: Negative for difficulty urinating, dysuria, flank pain, frequency, hematuria and urgency.  Musculoskeletal: Negative for arthralgias, back pain, joint swelling, myalgias and neck stiffness.  Skin: Negative for pallor and rash.  Neurological: Negative for dizziness, speech difficulty, weakness and headaches.  Hematological: Negative for adenopathy. Does not bruise/bleed easily.  Psychiatric/Behavioral: Negative for confusion and sleep disturbance. The patient is not nervous/anxious.      PE; Blood pressure (!) 149/98, pulse 93, temperature 99.2 F (37.3 C), temperature source Temporal, resp. rate 16, height 5' 10.5" (1.791 m), weight 250 lb (113.4 kg), SpO2 95 %. Body mass index is 35.36 kg/m.  Gen: Alert, well appearing.  Patient is oriented to person, place, time, and situation. AFFECT: pleasant, lucid thought and speech. ENT: Ears: EACs clear, normal epithelium.  TMs with good light reflex and landmarks bilaterally.  Eyes: no injection, icteris, swelling, or exudate.  EOMI, PERRLA. Nose: no drainage or turbinate edema/swelling.  No injection or focal lesion.  Mouth: lips without lesion/swelling.  Oral mucosa pink and moist.  Dentition intact and without obvious caries or gingival swelling.  Oropharynx without erythema, exudate, or swelling.  Neck: supple/nontender.  No LAD, mass, or TM.  Carotid pulses 2+ bilaterally, without bruits. CV: RRR, no m/r/g.   LUNGS: CTA bilat, nonlabored resps, good aeration in all lung fields. ABD: soft, NT, ND, BS normal.  No hepatospenomegaly or mass.  No bruits. EXT: no clubbing, cyanosis, or edema.  Musculoskeletal: no joint swelling, erythema, warmth, or tenderness.  ROM of all joints  intact. Skin - no sores or suspicious lesions or rashes or color changes   Pertinent labs:  Lab Results  Component Value Date   TSH 2.88 01/19/2018   Lab Results  Component Value Date   WBC 5.5 01/19/2018   HGB 15.1 01/19/2018   HCT 44.8 01/19/2018   MCV 93.0 01/19/2018   PLT 262.0 01/19/2018   Lab Results  Component Value Date   CREATININE 0.84 01/19/2018   BUN 14 01/19/2018   NA 142 01/19/2018   K 4.5 01/19/2018   CL 104 01/19/2018   CO2 29 01/19/2018   Lab Results  Component Value Date  ALT 53 01/19/2018   AST 43 (H) 01/19/2018   ALKPHOS 100 01/19/2018   BILITOT 0.4 01/19/2018   Lab Results  Component Value Date   CHOL 201 (H) 01/19/2018   Lab Results  Component Value Date   HDL 65.90 01/19/2018   Lab Results  Component Value Date   LDLCALC 112 (H) 01/19/2018   Lab Results  Component Value Date   TRIG 118.0 01/19/2018   Lab Results  Component Value Date   CHOLHDL 3 01/19/2018   Lab Results  Component Value Date   HGBA1C 6.0 01/19/2018    ASSESSMENT AND PLAN:   1) HTN: uncontrolled.  Increase irbesartan/hct to 300-12.5 tab qd. Continue amlodipine 38m qd.   Lytes/cr today. Encouraged him to improve diet and increase exercise. Instructions: Check your blood pressure and heart rate daily and right the numbers down and bring them in for review with me in 3-4 weeks.  2) Health maintenance exam: Reviewed age and gender appropriate health maintenance issues (prudent diet, regular exercise, health risks of tobacco and excessive alcohol, use of seatbelts, fire alarms in home, use of sunscreen).  Also reviewed age and gender appropriate health screening as well as vaccine recommendations. Vaccines: flu vaccine->given today.  Tdap is UTD. Labs: fasting HP + A1c (prediabetes). Prostate ca screening: average risk patient= as per latest guidelines, start screening at 598yrs of age. Colon ca screening: average risk patient= as per latest guidelines, start  screening at 596yrs of age.  An After Visit Summary was printed and given to the patient.  FOLLOW UP:  Return for 3-4 weeks f/u HTN.  Signed:  PCrissie Sickles MD           04/04/2019

## 2019-04-05 ENCOUNTER — Encounter: Payer: Self-pay | Admitting: Family Medicine

## 2019-04-05 LAB — CBC WITH DIFFERENTIAL/PLATELET
Absolute Monocytes: 732 cells/uL (ref 200–950)
Basophils Absolute: 42 cells/uL (ref 0–200)
Basophils Relative: 0.7 %
Eosinophils Absolute: 198 cells/uL (ref 15–500)
Eosinophils Relative: 3.3 %
HCT: 46.9 % (ref 38.5–50.0)
Hemoglobin: 16.2 g/dL (ref 13.2–17.1)
Lymphs Abs: 960 cells/uL (ref 850–3900)
MCH: 31.3 pg (ref 27.0–33.0)
MCHC: 34.5 g/dL (ref 32.0–36.0)
MCV: 90.5 fL (ref 80.0–100.0)
MPV: 9.8 fL (ref 7.5–12.5)
Monocytes Relative: 12.2 %
Neutro Abs: 4068 cells/uL (ref 1500–7800)
Neutrophils Relative %: 67.8 %
Platelets: 222 10*3/uL (ref 140–400)
RBC: 5.18 10*6/uL (ref 4.20–5.80)
RDW: 11.9 % (ref 11.0–15.0)
Total Lymphocyte: 16 %
WBC: 6 10*3/uL (ref 3.8–10.8)

## 2019-04-05 LAB — LIPID PANEL
Cholesterol: 199 mg/dL (ref <200)
HDL: 67 mg/dL (ref >=40)
LDL Cholesterol (Calc): 116 mg/dL (calc) — ABNORMAL HIGH
Non-HDL Cholesterol (Calc): 132 mg/dL (calc) — ABNORMAL HIGH (ref <130)
Total CHOL/HDL Ratio: 3 (calc) (ref <5.0)
Triglycerides: 64 mg/dL (ref <150)

## 2019-04-05 LAB — HEMOGLOBIN A1C
Hgb A1c MFr Bld: 5.7 % of total Hgb — ABNORMAL HIGH (ref <5.7)
Mean Plasma Glucose: 117 (calc)
eAG (mmol/L): 6.5 (calc)

## 2019-04-05 LAB — COMPREHENSIVE METABOLIC PANEL
AG Ratio: 1.5 (calc) (ref 1.0–2.5)
ALT: 47 U/L — ABNORMAL HIGH (ref 9–46)
AST: 41 U/L — ABNORMAL HIGH (ref 10–40)
Albumin: 4.6 g/dL (ref 3.6–5.1)
Alkaline phosphatase (APISO): 108 U/L (ref 36–130)
BUN: 17 mg/dL (ref 7–25)
CO2: 26 mmol/L (ref 20–32)
Calcium: 9.7 mg/dL (ref 8.6–10.3)
Chloride: 102 mmol/L (ref 98–110)
Creat: 0.92 mg/dL (ref 0.60–1.35)
Globulin: 3.1 g/dL (calc) (ref 1.9–3.7)
Glucose, Bld: 115 mg/dL — ABNORMAL HIGH (ref 65–99)
Potassium: 4.5 mmol/L (ref 3.5–5.3)
Sodium: 139 mmol/L (ref 135–146)
Total Bilirubin: 0.6 mg/dL (ref 0.2–1.2)
Total Protein: 7.7 g/dL (ref 6.1–8.1)

## 2019-04-05 LAB — TSH: TSH: 3.52 mIU/L (ref 0.40–4.50)

## 2019-04-12 ENCOUNTER — Other Ambulatory Visit: Payer: Self-pay | Admitting: Family Medicine

## 2019-05-01 ENCOUNTER — Other Ambulatory Visit: Payer: Self-pay | Admitting: Family Medicine

## 2019-05-23 ENCOUNTER — Other Ambulatory Visit: Payer: Self-pay | Admitting: Family Medicine

## 2019-05-23 MED ORDER — METOCLOPRAMIDE HCL 10 MG PO TABS: 10.0000 mg | ORAL_TABLET | Freq: Two times a day (BID) | ORAL | 1 refills | Status: DC

## 2019-05-23 NOTE — Telephone Encounter (Signed)
Patient is requesting medication refill. Last written was over a year ago. Unsure if I am able to fill. Please advise, thank you

## 2019-05-23 NOTE — Telephone Encounter (Signed)
Requested medication (s) are due for refill today: yes  Requested medication (s) are on the active medication list: yes  Last refill:  04/2018  Future visit scheduled: no  Notes to clinic:  Not delegated

## 2019-05-23 NOTE — Telephone Encounter (Signed)
Requested medication (s) are due for refill today: yes  Requested medication (s) are on the active medication list: yes  Last refill:  05/01/2018  Future visit scheduled: no  Notes to clinic:  Not delegated    Requested Prescriptions  Pending Prescriptions Disp Refills   metoCLOPramide (REGLAN) 10 MG tablet 180 tablet 1    Sig: Take 1 tablet (10 mg total) by mouth 2 (two) times daily.     Not Delegated - Gastroenterology: Antiemetics Failed - 05/23/2019 10:21 AM      Failed - This refill cannot be delegated      Passed - Valid encounter within last 6 months    Recent Outpatient Visits          1 month ago Essential hypertension   [place], Adrian Blackwater, MD   6 months ago Essential hypertension   [place], Adrian Blackwater, MD   1 year ago Essential hypertension   [place], Adrian Blackwater, MD   1 year ago Essential hypertension   [place], Adrian Blackwater, MD   2 years ago Essential hypertension   [place], Adrian Blackwater, MD

## 2019-05-23 NOTE — Telephone Encounter (Signed)
Medication Refill - Medication: metoCLOPramide (REGLAN) 10 MG tablet   Preferred Pharmacy (with phone number or street name):  [id number]- [place], [place] - [address] [phone](Phone) [phone](Fax)    Agent: Please be advised that RX refills may take up to 3 business days. We ask that you follow-up with your pharmacy.

## 2019-05-26 ENCOUNTER — Other Ambulatory Visit: Payer: Self-pay | Admitting: Family Medicine

## 2019-07-11 ENCOUNTER — Other Ambulatory Visit: Payer: Self-pay | Admitting: Family Medicine

## 2019-08-15 ENCOUNTER — Other Ambulatory Visit: Payer: Self-pay | Admitting: Family Medicine

## 2019-08-15 DIAGNOSIS — I10 Essential (primary) hypertension: Secondary | ICD-10-CM

## 2019-08-16 NOTE — Telephone Encounter (Signed)
Patient has appointment 08/17/19.  Will refill at appointment.

## 2019-08-17 ENCOUNTER — Other Ambulatory Visit: Payer: Self-pay

## 2019-08-17 ENCOUNTER — Encounter: Payer: Self-pay | Admitting: Family Medicine

## 2019-08-17 ENCOUNTER — Ambulatory Visit (INDEPENDENT_AMBULATORY_CARE_PROVIDER_SITE_OTHER): Payer: 59 | Admitting: Family Medicine

## 2019-08-17 VITALS — BP 148/94

## 2019-08-17 DIAGNOSIS — R7301 Impaired fasting glucose: Secondary | ICD-10-CM

## 2019-08-17 DIAGNOSIS — E7800 Pure hypercholesterolemia, unspecified: Secondary | ICD-10-CM

## 2019-08-17 DIAGNOSIS — I10 Essential (primary) hypertension: Secondary | ICD-10-CM | POA: Diagnosis not present

## 2019-08-17 MED ORDER — IRBESARTAN-HYDROCHLOROTHIAZIDE 150-12.5 MG PO TABS: 2.0000 | ORAL_TABLET | Freq: Every day | ORAL | 3 refills | Status: DC

## 2019-08-17 NOTE — Progress Notes (Signed)
Virtual Visit via Video Note  I connected with pt on 08/17/19 at  3:00 PM EST by a video enabled telemedicine application and verified that I am speaking with the correct person using two identifiers.  Location patient: home Location provider:work or home office Persons participating in the virtual visit: patient, provider  I discussed the limitations of evaluation and management by telemedicine and the availability of in person appointments. The patient expressed understanding and agreed to proceed.  Telemedicine visit is a necessity given the COVID-19 restrictions in place at the current time.  HPI: 44 y/o WM being seen today for f/u uncontrolled HTN. I last saw him for his CPE and f/u HTN about 4 mo ago. A/P as of that visit: "HTN: uncontrolled.  Increase irbesartan/hct to 300-12.5 tab qd. Continue amlodipine 69m qd.   Lytes/cr today. Encouraged him to improve diet and increase exercise. Instructions: Check your blood pressure and heart rate daily and right the numbers down and bring them in for review with me in 3-4 weeks."  Interim hx:  Has been feeling well. BPs taken today were done with small cuff and w/out resting for 5 min first. Otherwise has not monitored bp since last o/v with me. He ran out of the avalide 300/12.5.  Started taking old avalide pills 150/12.5 TWO qd lately. 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  Lots of jobs lined up though.  ROS: See pertinent positives and negatives per HPI.  Past Medical History:  Diagnosis Date  . Atypical chest pain since 2007   w/u has included a CT angio chest (neg), myoview (normal), EGD  . Delayed gastric emptying   . GERD (gastroesophageal reflux disease)   . Hyperlipidemia    Intol of atorv, rosuva, lovalo, and zetia.  Good on pravastatin.  . Marland Kitchenypertension   . IFG (impaired fasting glucose) 2016   HbA1c 5.6%.  Repeat was 5.5% 01/2016.  Repeat 03/2017 5.8%.  01/2018 A1c 6.0%.. 03/2019  A1c 5.7%.  . Insomnia     Past Surgical History:  Procedure Laterality Date  . CARDIOVASCULAR STRESS TEST  07/2011   Normal, excellent exercise capacity  . gastric emptying scan  2007   Delayed--reglan started    Family History  Problem Relation Age of Onset  . Coronary artery disease Other   . Arthritis Mother   . Heart disease Mother   . Hyperlipidemia Mother   . Arthritis Father   . Heart disease Father   . Hyperlipidemia Father     Social History   Socioeconomic History  . Marital status: Married    Spouse name: Not on file  . Number of children: Not on file  . Years of education: Not on file  . Highest education level: Not on file  Occupational History  . Not on file  Tobacco Use  . Smoking status: Former SmResearch scientist (life sciences). Smokeless tobacco: Current User  . Tobacco comment: uses 1 can a day, was a periodic smoker, but not now.  Substance and Sexual Activity  . Alcohol use: Yes  . Drug use: No  . Sexual activity: Yes  Other Topics Concern  . Not on file  Social History Narrative   Married, 2 chldren.   Occupation: Owns a grPatent examiner  [place]  He dips.  No smoking.  Alcohol intake: 2-3 beers a day on his days off.            Social Determinants of  Health   Financial Resource Strain:   . Difficulty of Paying Living Expenses: Not on file  Food Insecurity:   . Worried About Charity fundraiser in the Last Year: Not on file  . Ran Out of Food in the Last Year: Not on file  Transportation Needs:   . Lack of Transportation (Medical): Not on file  . Lack of Transportation (Non-Medical): Not on file  Physical Activity:   . Days of Exercise per Week: Not on file  . Minutes of Exercise per Session: Not on file  Stress:   . Feeling of Stress : Not on file  Social Connections:   . Frequency of Communication with Friends and Family: Not on file  . Frequency of Social Gatherings with Friends and Family: Not on file  . Attends Religious Services:  Not on file  . Active Member of Clubs or Organizations: Not on file  . Attends Archivist Meetings: Not on file  . Marital Status: Not on file      Current Outpatient Medications:  .  amLODipine (NORVASC) 5 MG tablet, TAKE 1/2 TABLET BY MOUTH 2 TIMES DAILY. (Patient taking differently: Pt takes 1 tablet qAM.), Disp: 90 tablet, Rfl: 3 .  esomeprazole (NEXIUM) 40 MG capsule, TAKE ONE CAPSULE BY MOUTH TWICE A DAY, Disp: 180 capsule, Rfl: 3 .  irbesartan-hydrochlorothiazide (AVALIDE) 150-12.5 MG tablet, Take 1.5 tablets by mouth in the morning and at bedtime., Disp: , Rfl:  .  metoCLOPramide (REGLAN) 10 MG tablet, Take 1 tablet (10 mg total) by mouth 2 (two) times daily., Disp: 180 tablet, Rfl: 1 .  Multiple Vitamin (MULTIVITAMIN) tablet, Take 1 tablet by mouth daily., Disp: , Rfl:  .  pravastatin (PRAVACHOL) 40 MG tablet, TAKE 1 TABLET BY MOUTH EVERY DAY, Disp: 90 tablet, Rfl: 0 .  irbesartan-hydrochlorothiazide (AVALIDE) 300-12.5 MG tablet, TAKE 1 TABLET BY MOUTH EVERY DAY (Patient not taking: Reported on 08/17/2019), Disp: 30 tablet, Rfl: 0  EXAM:  VITALS per patient if applicable:BP (!) 174/94 (BP Location: Left Arm, Patient Position: Sitting, Cuff Size: Large)    GENERAL: alert, oriented, appears well and in no acute distress  HEENT: atraumatic, conjunttiva clear, no obvious abnormalities on inspection of external nose and ears  NECK: normal movements of the head and neck  LUNGS: on inspection no signs of respiratory distress, breathing rate appears normal, no obvious gross SOB, gasping or wheezing  CV: no obvious cyanosis  MS: moves all visible extremities without noticeable abnormality  PSYCH/NEURO: pleasant and cooperative, no obvious depression or anxiety, speech and thought processing grossly intact  LABS: none today    Chemistry      Component Value Date/Time   NA 139 04/04/2019 0816   K 4.5 04/04/2019 0816   CL 102 04/04/2019 0816   CO2 26 04/04/2019 0816    BUN 17 04/04/2019 0816   CREATININE 0.92 04/04/2019 0816      Component Value Date/Time   CALCIUM 9.7 04/04/2019 0816   ALKPHOS 100 01/19/2018 0816   AST 41 (H) 04/04/2019 0816   ALT 47 (H) 04/04/2019 0816   BILITOT 0.6 04/04/2019 0816     Lab Results  Component Value Date   HGBA1C 5.7 (H) 04/04/2019    ASSESSMENT AND PLAN:  Discussed the following assessment and plan:  HTN: need more consistent/accurate home bp monitoring to go on. Continue TWO avalide 150/12.5 tabs qd and amlodipine 58m qAM. He'll try to get an extra large cuff. Check bp regularly and send these  via MyChart message to me in 2 wks.   I discussed the assessment and treatment plan with the patient. The patient was provided an opportunity to ask questions and all were answered. The patient agreed with the plan and demonstrated an understanding of the instructions.   The patient was advised to call back or seek an in-person evaluation if the symptoms worsen or if the condition fails to improve as anticipated.  F/u: labs at his earliest convenience, telemed f/u 1 mo (htn) He'll send bp report in 2 wks via mychart: if not at goal 130/80 avg then will increase amlodipine to 10 mg qAM.  Signed:  Crissie Sickles, MD           08/17/2019

## 2019-08-20 ENCOUNTER — Telehealth: Payer: Self-pay

## 2019-08-20 ENCOUNTER — Other Ambulatory Visit: Payer: Self-pay

## 2019-08-20 MED ORDER — ESOMEPRAZOLE MAGNESIUM 40 MG PO CPDR: 40.0000 mg | DELAYED_RELEASE_CAPSULE | Freq: Two times a day (BID) | ORAL | 0 refills | Status: DC

## 2019-08-20 NOTE — Telephone Encounter (Signed)
Patient refill request  amLODipine (NORVASC) 5 MG tablet [[id number]]  esomeprazole (NEXIUM) 40 MG capsule [[id number]]   [id number] [place], [place]

## 2019-08-20 NOTE — Telephone Encounter (Signed)
RF's sent. MyChart message sent.

## 2019-09-11 ENCOUNTER — Other Ambulatory Visit: Payer: Self-pay | Admitting: Family Medicine

## 2019-09-11 DIAGNOSIS — I10 Essential (primary) hypertension: Secondary | ICD-10-CM

## 2019-09-28 ENCOUNTER — Other Ambulatory Visit: Payer: Self-pay | Admitting: Family Medicine

## 2019-10-06 ENCOUNTER — Other Ambulatory Visit: Payer: Self-pay | Admitting: Family Medicine

## 2019-10-06 DIAGNOSIS — I10 Essential (primary) hypertension: Secondary | ICD-10-CM

## 2019-10-10 ENCOUNTER — Other Ambulatory Visit: Payer: Self-pay | Admitting: Family Medicine

## 2019-10-10 DIAGNOSIS — I10 Essential (primary) hypertension: Secondary | ICD-10-CM

## 2019-10-22 ENCOUNTER — Other Ambulatory Visit: Payer: Self-pay

## 2019-10-22 ENCOUNTER — Telehealth: Payer: Self-pay

## 2019-10-22 DIAGNOSIS — I10 Essential (primary) hypertension: Secondary | ICD-10-CM

## 2019-10-22 MED ORDER — AMLODIPINE BESYLATE 5 MG PO TABS: ORAL_TABLET | ORAL | 0 refills | Status: DC

## 2019-10-22 NOTE — Telephone Encounter (Signed)
Patient called in wanting to get a medication refill on medication let patient know he did need an appt. Patient is scheduled for 10/29/19 virtual. Asking for a bridge of medication until appt     Please advise

## 2019-10-22 NOTE — Telephone Encounter (Signed)
Refill sent in to last until 4/26 appt

## 2019-10-24 ENCOUNTER — Other Ambulatory Visit: Payer: Self-pay

## 2019-10-24 DIAGNOSIS — I10 Essential (primary) hypertension: Secondary | ICD-10-CM

## 2019-10-24 MED ORDER — AMLODIPINE BESYLATE 5 MG PO TABS: ORAL_TABLET | ORAL | 0 refills | Status: DC

## 2019-10-29 ENCOUNTER — Other Ambulatory Visit: Payer: Self-pay

## 2019-10-29 ENCOUNTER — Encounter: Payer: Self-pay | Admitting: Family Medicine

## 2019-10-29 ENCOUNTER — Telehealth (INDEPENDENT_AMBULATORY_CARE_PROVIDER_SITE_OTHER): Payer: 59 | Admitting: Family Medicine

## 2019-10-29 VITALS — BP 138/89 | Wt 247.0 lb

## 2019-10-29 DIAGNOSIS — E7800 Pure hypercholesterolemia, unspecified: Secondary | ICD-10-CM | POA: Diagnosis not present

## 2019-10-29 DIAGNOSIS — R7401 Elevation of levels of liver transaminase levels: Secondary | ICD-10-CM | POA: Diagnosis not present

## 2019-10-29 DIAGNOSIS — R7301 Impaired fasting glucose: Secondary | ICD-10-CM

## 2019-10-29 DIAGNOSIS — I10 Essential (primary) hypertension: Secondary | ICD-10-CM

## 2019-10-29 NOTE — Progress Notes (Signed)
Virtual Visit via Video Note  I connected with Carlos James on 10/29/19 at 10:30 AM EDT by a video enabled telemedicine application and verified that I am speaking with the correct person using two identifiers.  Location patient: home Location provider:work or home office Persons participating in the virtual visit: patient, provider  I discussed the limitations of evaluation and management by telemedicine and the availability of in person appointments. The patient expressed understanding and agreed to proceed.  Telemedicine visit is a necessity given the COVID-19 restrictions in place at the current time.  HPI: 44y/o WM being seen today for f/u HTN, IFG, and HLD.  He feels well, no complaints.  Last visit our plan was to have him get a larger cuff for his home bp monitor in attempt to get more accurate readings for comparison to our in-office measurements. He did not get a extra large cuff as we talked about.  Has checked bp some but last check was approx 2 wks ago and was 138/88.  HLD: tolerating statin.  IFG/obesity: working on improving diet.  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    ROS: See pertinent positives and negatives per HPI.  Past Medical History:  Diagnosis Date  . Atypical chest pain since 2007   w/u has included a CT angio chest (neg), myoview (normal), EGD  . Delayed gastric emptying   . GERD (gastroesophageal reflux disease)   . Hyperlipidemia    Intol of atorv, rosuva, lovalo, and zetia.  Good on pravastatin.  Marland Kitchen Hypertension   . IFG (impaired fasting glucose) 2016   HbA1c 5.6%.  Repeat was 5.5% 01/2016.  Repeat 03/2017 5.8%.  01/2018 A1c 6.0%.. 03/2019 A1c 5.7%.  . Insomnia   . Obesity, Class II, BMI 35-39.9     Past Surgical History:  Procedure Laterality Date  . CARDIOVASCULAR STRESS TEST  07/2011   Normal, excellent exercise capacity  . gastric emptying scan  2007   Delayed--reglan started    Family History  Problem  Relation Age of Onset  . Coronary artery disease Other   . Arthritis Mother   . Heart disease Mother   . Hyperlipidemia Mother   . Arthritis Father   . Heart disease Father   . Hyperlipidemia Father      Current Outpatient Medications:  .  amLODipine (NORVASC) 5 MG tablet, TAKE 1/2 TABLET BY MOUTH 2 TIMES DAILY, Disp: 90 tablet, Rfl: 0 .  esomeprazole (NEXIUM) 40 MG capsule, Take 1 capsule (40 mg total) by mouth 2 (two) times daily., Disp: 180 capsule, Rfl: 0 .  irbesartan-hydrochlorothiazide (AVALIDE) 150-12.5 MG tablet, Take 2 tablets by mouth daily., Disp: 180 tablet, Rfl: 3 .  metoCLOPramide (REGLAN) 10 MG tablet, Take 1 tablet (10 mg total) by mouth 2 (two) times daily., Disp: 180 tablet, Rfl: 1 .  Multiple Vitamin (MULTIVITAMIN) tablet, Take 1 tablet by mouth daily., Disp: , Rfl:  .  pravastatin (PRAVACHOL) 40 MG tablet, TAKE 1 TABLET BY MOUTH EVERY DAY, Disp: 30 tablet, Rfl: 0  EXAM:  VITALS per patient if applicable: BP 989/21 (BP Location: Left Arm, Patient Position: Sitting, Cuff Size: Large)   Wt 247 lb (112 kg)   BMI 34.94 kg/m    GENERAL: alert, oriented, appears well and in no acute distress  HEENT: atraumatic, conjunttiva clear, no obvious abnormalities on inspection of external nose and ears  NECK: normal movements of the head and neck  LUNGS: on inspection no signs of respiratory distress, breathing rate  appears normal, no obvious gross SOB, gasping or wheezing  CV: no obvious cyanosis  MS: moves all visible extremities without noticeable abnormality  PSYCH/NEURO: pleasant and cooperative, no obvious depression or anxiety, speech and thought processing grossly intact  LABS: none today Lab Results  Component Value Date   TSH 3.52 04/04/2019   Lab Results  Component Value Date   WBC 6.0 04/04/2019   HGB 16.2 04/04/2019   HCT 46.9 04/04/2019   MCV 90.5 04/04/2019   PLT 222 04/04/2019   Lab Results  Component Value Date   CREATININE 0.92 04/04/2019    BUN 17 04/04/2019   NA 139 04/04/2019   K 4.5 04/04/2019   CL 102 04/04/2019   CO2 26 04/04/2019   Lab Results  Component Value Date   ALT 47 (H) 04/04/2019   AST 41 (H) 04/04/2019   ALKPHOS 100 01/19/2018   BILITOT 0.6 04/04/2019   Lab Results  Component Value Date   CHOL 199 04/04/2019   Lab Results  Component Value Date   HDL 67 04/04/2019   Lab Results  Component Value Date   LDLCALC 116 (H) 04/04/2019   Lab Results  Component Value Date   TRIG 64 04/04/2019   Lab Results  Component Value Date   CHOLHDL 3.0 04/04/2019    ASSESSMENT AND PLAN:  Discussed the following assessment and plan:  IFG (impaired fasting glucose) - Plan: Hemoglobin A1c, Comprehensive metabolic panel  Hypercholesterolemia - Plan: Lipid panel, Comprehensive metabolic panel  Elevated transaminase level - Plan: Comprehensive metabolic panel  Uncontrolled hypertension - Plan: Comprehensive metabolic panel  Plan: increase amlodipine to 2 of the whole 15m tabs daily. Continue avalide2 150/12.5 tabs qd Continue pravastatin 455mqd. Encouraged TLC.   I discussed the assessment and treatment plan with the patient. The patient was provided an opportunity to ask questions and all were answered. The patient agreed with the plan and demonstrated an understanding of the instructions.   The patient was advised to call back or seek an in-person evaluation if the symptoms worsen or if the condition fails to improve as anticipated.  F/u: 2 mo f/u HTN, virtual or in-person  Next cPE due 6 mo.  Signed:  PhCrissie SicklesMD           10/29/2019

## 2019-11-01 ENCOUNTER — Other Ambulatory Visit: Payer: Self-pay | Admitting: Family Medicine

## 2019-12-14 ENCOUNTER — Other Ambulatory Visit: Payer: Self-pay | Admitting: Family Medicine

## 2019-12-14 DIAGNOSIS — I10 Essential (primary) hypertension: Secondary | ICD-10-CM

## 2019-12-18 ENCOUNTER — Telehealth: Payer: Self-pay

## 2019-12-18 ENCOUNTER — Other Ambulatory Visit: Payer: Self-pay

## 2019-12-18 DIAGNOSIS — I10 Essential (primary) hypertension: Secondary | ICD-10-CM

## 2019-12-18 MED ORDER — AMLODIPINE BESYLATE 5 MG PO TABS: ORAL_TABLET | ORAL | 0 refills | Status: DC

## 2019-12-18 NOTE — Telephone Encounter (Signed)
Patient refill request --- patient needs NEW prescription Dr. Anitra Lauth doubled dosage so he is taking 2 pills  He is now out of meds, he went to pharmacy, pharmacy has old dosage instructions, they need new dosage so insurance will cover medication.  [facility] [place]

## 2019-12-18 NOTE — Telephone Encounter (Signed)
Contacted patient to clarify which medication needed refill, amlodipine. It was increased during last visit to 2 whole tabs daily. Called pharmacy to cancel Rx sent 6/15 #90 and resubmitted for #180 with new sig: take 2 tablets daily.

## 2019-12-24 ENCOUNTER — Ambulatory Visit (INDEPENDENT_AMBULATORY_CARE_PROVIDER_SITE_OTHER): Payer: No Typology Code available for payment source | Admitting: Family Medicine

## 2019-12-24 ENCOUNTER — Other Ambulatory Visit: Payer: Self-pay

## 2019-12-24 DIAGNOSIS — I10 Essential (primary) hypertension: Secondary | ICD-10-CM

## 2019-12-24 DIAGNOSIS — R7301 Impaired fasting glucose: Secondary | ICD-10-CM | POA: Diagnosis not present

## 2019-12-24 DIAGNOSIS — E7800 Pure hypercholesterolemia, unspecified: Secondary | ICD-10-CM

## 2019-12-24 DIAGNOSIS — R7401 Elevation of levels of liver transaminase levels: Secondary | ICD-10-CM

## 2019-12-25 ENCOUNTER — Encounter: Payer: Self-pay | Admitting: Family Medicine

## 2019-12-25 LAB — COMPREHENSIVE METABOLIC PANEL
AG Ratio: 1.4 (calc) (ref 1.0–2.5)
ALT: 49 U/L — ABNORMAL HIGH (ref 9–46)
AST: 40 U/L (ref 10–40)
Albumin: 4.4 g/dL (ref 3.6–5.1)
Alkaline phosphatase (APISO): 119 U/L (ref 36–130)
BUN: 13 mg/dL (ref 7–25)
CO2: 24 mmol/L (ref 20–32)
Calcium: 9.5 mg/dL (ref 8.6–10.3)
Chloride: 103 mmol/L (ref 98–110)
Creat: 0.79 mg/dL (ref 0.60–1.35)
Globulin: 3.2 g/dL (calc) (ref 1.9–3.7)
Glucose, Bld: 107 mg/dL — ABNORMAL HIGH (ref 65–99)
Potassium: 4.4 mmol/L (ref 3.5–5.3)
Sodium: 141 mmol/L (ref 135–146)
Total Bilirubin: 0.6 mg/dL (ref 0.2–1.2)
Total Protein: 7.6 g/dL (ref 6.1–8.1)

## 2019-12-25 LAB — CBC WITH DIFFERENTIAL/PLATELET
Absolute Monocytes: 611 cells/uL (ref 200–950)
Basophils Absolute: 32 cells/uL (ref 0–200)
Basophils Relative: 0.5 %
Eosinophils Absolute: 183 cells/uL (ref 15–500)
Eosinophils Relative: 2.9 %
HCT: 44.5 % (ref 38.5–50.0)
Hemoglobin: 14.9 g/dL (ref 13.2–17.1)
Lymphs Abs: 1065 cells/uL (ref 850–3900)
MCH: 31 pg (ref 27.0–33.0)
MCHC: 33.5 g/dL (ref 32.0–36.0)
MCV: 92.5 fL (ref 80.0–100.0)
MPV: 9.6 fL (ref 7.5–12.5)
Monocytes Relative: 9.7 %
Neutro Abs: 4410 cells/uL (ref 1500–7800)
Neutrophils Relative %: 70 %
Platelets: 204 10*3/uL (ref 140–400)
RBC: 4.81 10*6/uL (ref 4.20–5.80)
RDW: 11.9 % (ref 11.0–15.0)
Total Lymphocyte: 16.9 %
WBC: 6.3 10*3/uL (ref 3.8–10.8)

## 2019-12-25 LAB — HEMOGLOBIN A1C
Hgb A1c MFr Bld: 5.5 % of total Hgb (ref <5.7)
Mean Plasma Glucose: 111 (calc)
eAG (mmol/L): 6.2 (calc)

## 2019-12-25 LAB — LIPID PANEL
Cholesterol: 190 mg/dL (ref <200)
HDL: 75 mg/dL (ref >=40)
LDL Cholesterol (Calc): 100 mg/dL (calc) — ABNORMAL HIGH
Non-HDL Cholesterol (Calc): 115 mg/dL (calc) (ref <130)
Total CHOL/HDL Ratio: 2.5 (calc) (ref <5.0)
Triglycerides: 61 mg/dL (ref <150)

## 2020-01-30 ENCOUNTER — Other Ambulatory Visit: Payer: Self-pay | Admitting: Family Medicine

## 2020-03-05 ENCOUNTER — Other Ambulatory Visit: Payer: Self-pay | Admitting: Family Medicine

## 2020-03-05 DIAGNOSIS — I10 Essential (primary) hypertension: Secondary | ICD-10-CM

## 2020-04-02 ENCOUNTER — Other Ambulatory Visit: Payer: Self-pay

## 2020-04-02 ENCOUNTER — Telehealth: Payer: Self-pay | Admitting: Family Medicine

## 2020-04-02 DIAGNOSIS — I10 Essential (primary) hypertension: Secondary | ICD-10-CM

## 2020-04-02 MED ORDER — AMLODIPINE BESYLATE 10 MG PO TABS: ORAL_TABLET | ORAL | 0 refills | Status: DC

## 2020-04-02 MED ORDER — METOCLOPRAMIDE HCL 10 MG PO TABS: 10.0000 mg | ORAL_TABLET | Freq: Two times a day (BID) | ORAL | 0 refills | Status: DC

## 2020-04-02 MED ORDER — ESOMEPRAZOLE MAGNESIUM 40 MG PO CPDR: 40.0000 mg | DELAYED_RELEASE_CAPSULE | Freq: Two times a day (BID) | ORAL | 0 refills | Status: DC

## 2020-04-02 NOTE — Telephone Encounter (Signed)
Pt is willing to do a VV but if he needs to come in for a visit he needs morning and first available is 10/6. Is out of irbesartan and almost out of all other medications and needs them called in prior to 10/6.

## 2020-04-02 NOTE — Telephone Encounter (Signed)
Patient advised 30 day supply could be sent for medications he is currently out of. Pravastatin and Irbesartan should be available already at the pahrmacy, both have enough for 90 day supply. He will contact pharmacy. Pharmacy verified. Refill sent for amlodipine, Reglan and esomeprazole.

## 2020-04-09 ENCOUNTER — Encounter: Payer: Self-pay | Admitting: Family Medicine

## 2020-04-09 ENCOUNTER — Other Ambulatory Visit: Payer: Self-pay

## 2020-04-09 ENCOUNTER — Ambulatory Visit (INDEPENDENT_AMBULATORY_CARE_PROVIDER_SITE_OTHER): Payer: No Typology Code available for payment source | Admitting: Family Medicine

## 2020-04-09 VITALS — BP 150/83 | HR 89 | Temp 98.4°F | Resp 16 | Ht 70.5 in | Wt 252.2 lb

## 2020-04-09 DIAGNOSIS — E7800 Pure hypercholesterolemia, unspecified: Secondary | ICD-10-CM

## 2020-04-09 DIAGNOSIS — R7401 Elevation of levels of liver transaminase levels: Secondary | ICD-10-CM | POA: Diagnosis not present

## 2020-04-09 DIAGNOSIS — R7301 Impaired fasting glucose: Secondary | ICD-10-CM | POA: Diagnosis not present

## 2020-04-09 DIAGNOSIS — I10 Essential (primary) hypertension: Secondary | ICD-10-CM | POA: Diagnosis not present

## 2020-04-09 MED ORDER — METOPROLOL SUCCINATE ER 25 MG PO TB24: 25.0000 mg | ORAL_TABLET | Freq: Every day | ORAL | 1 refills | Status: DC

## 2020-04-09 NOTE — Progress Notes (Signed)
OFFICE VISIT  04/09/2020  CC:  Chief Complaint  Patient presents with  . Follow-up    Hypertension, pt is not fasting    HPI:    Patient is a 44 y.o. Caucasian male who presents for f/u uncontrolled HTN, HLD, IFG, hx of elevated transaminases. I last saw him 6 months ago. A/P as of last visit: "IFG (impaired fasting glucose) - Plan: Hemoglobin A1c, Comprehensive metabolic panel  Hypercholesterolemia - Plan: Lipid panel, Comprehensive metabolic panel  Elevated transaminase level - Plan: Comprehensive metabolic panel  Uncontrolled hypertension - Plan: Comprehensive metabolic panel  Plan: increase amlodipine to 2 of the whole 56m tabs daily. Continue avalide2 150/12.5 tabs qd Continue pravastatin 421mqd. Encouraged TLC."  INTERIM HX: 12/2019 labs all stable, no changes made.  Feeling well other than periodic bilat ankles edema after he has been standing and working 1/2 a day. Not eating very healthy, not exercising. No home bp monitoring.  HLD: tolerating pravastatin 40 qd.  ROS: no fevers, no CP, no SOB, no wheezing, no cough, no dizziness, no HAs, no rashes, no melena/hematochezia.  No polyuria or polydipsia.  No myalgias or arthralgias.  No focal weakness, paresthesias, or tremors.  No acute vision or hearing abnormalities. No n/v/d or abd pain.  No palpitations.    Past Medical History:  Diagnosis Date  . Atypical chest pain since 2007   w/u has included a CT angio chest (neg), myoview (normal), EGD  . Delayed gastric emptying   . GERD (gastroesophageal reflux disease)   . Hyperlipidemia    Intol of atorv, rosuva, lovalo, and zetia.  Good on pravastatin.  . Marland Kitchenypertension   . IFG (impaired fasting glucose) 2016   HbA1c 5.6%.  Repeat was 5.5% 01/2016.  Repeat 03/2017 5.8%.  01/2018 A1c 6.0%.. 03/2019 A1c 5.7%.  . Insomnia   . Obesity, Class II, BMI 35-39.9     Past Surgical History:  Procedure Laterality Date  . CARDIOVASCULAR STRESS TEST  07/2011   Normal,  excellent exercise capacity  . gastric emptying scan  2007   Delayed--reglan started    Outpatient Medications Prior to Visit  Medication Sig Dispense Refill  . amLODipine (NORVASC) 10 MG tablet TAKE 1 TABLET EVERY DAY. 30 tablet 0  . esomeprazole (NEXIUM) 40 MG capsule Take 1 capsule (40 mg total) by mouth 2 (two) times daily. 60 capsule 0  . irbesartan-hydrochlorothiazide (AVALIDE) 150-12.5 MG tablet Take 2 tablets by mouth daily. 180 tablet 3  . metoCLOPramide (REGLAN) 10 MG tablet Take 1 tablet (10 mg total) by mouth 2 (two) times daily. 60 tablet 0  . Multiple Vitamin (MULTIVITAMIN) tablet Take 1 tablet by mouth daily.    . pravastatin (PRAVACHOL) 40 MG tablet TAKE 1 TABLET BY MOUTH EVERY DAY 90 tablet 1   No facility-administered medications prior to visit.    Allergies  Allergen Reactions  . Ciprofloxacin     REACTION: Nausea    ROS As per HPI  PE: Vitals with BMI 04/09/2020 10/29/2019 08/17/2019  Height 5' 10.5" - -  Weight 252 lbs 3 oz 247 lbs -  BMI 3569.79 -  Systolic [id number] 83 89 94  Pulse 89 - -     Gen: Alert, well appearing.  Patient is oriented to person, place, time, and situation. AFFECT: pleasant, lucid thought and speech. CV: RRR, no m/r/g.   LUNGS: CTA bilat, nonlabored resps, good aeration in all lung fields. EXT: no clubbing or cyanosis.  1-2+ pitting edema in  both LL's from mid tibia into top of feet.  R a bit more than L.  +chronic freckling skin changes bilat.    LABS:  Lab Results  Component Value Date   TSH 3.52 04/04/2019   Lab Results  Component Value Date   WBC 6.3 12/24/2019   HGB 14.9 12/24/2019   HCT 44.5 12/24/2019   MCV 92.5 12/24/2019   PLT 204 12/24/2019   Lab Results  Component Value Date   CREATININE 0.79 12/24/2019   BUN 13 12/24/2019   NA 141 12/24/2019   K 4.4 12/24/2019   CL 103 12/24/2019   CO2 24 12/24/2019   Lab Results  Component Value Date   ALT 49 (H) 12/24/2019   AST 40 12/24/2019    ALKPHOS 100 01/19/2018   BILITOT 0.6 12/24/2019   Lab Results  Component Value Date   CHOL 190 12/24/2019   Lab Results  Component Value Date   HDL 75 12/24/2019   Lab Results  Component Value Date   LDLCALC 100 (H) 12/24/2019   Lab Results  Component Value Date   TRIG 61 12/24/2019   Lab Results  Component Value Date   CHOLHDL 2.5 12/24/2019   Lab Results  Component Value Date   HGBA1C 5.5 12/24/2019   IMPRESSION AND PLAN:  1) uncontrolled HTN:  Add toprol xl 25.  Continue irbes/hctz 150-12.5, two bid and amlodipine 73m qd. Monitor bp's and f/u to review in 2 wks.  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 Low sodium intake discussed/dietary handout given, elevate legs.  3) IFG: needs to work on [place] Nonfasting glucose check today.  4) HLD: tolerating prava 478mqd, continue this. Lipids good 12/2019.  5) Hx elevated LFTs: very mild.  Suspect fatty liver. monitor hepatic panel today.  An After Visit Summary was printed and given to the patient.  FOLLOW UP: No follow-ups on file.  Signed:  PhCrissie SicklesMD           04/09/2020

## 2020-04-09 NOTE — Patient Instructions (Addendum)
Check your blood pressure and heart rate once a day and write numbers down to bring back and review with me in 2-4 wks.   Low-Sodium Eating Plan Sodium, which is an element that makes up salt, helps you maintain a healthy balance of fluids in your body. Too much sodium can increase your blood pressure and cause fluid and waste to be held in your body. Your health care provider or dietitian may recommend following this plan if you have high blood pressure (hypertension), kidney disease, liver disease, or heart failure. Eating less sodium can help lower your blood pressure, reduce swelling, and protect your heart, liver, and kidneys. What are tips for following this plan? General guidelines  Most people on this plan should limit their sodium intake to 1,500-2,000 mg (milligrams) of sodium each day. Reading food labels   The Nutrition Facts label lists the amount of sodium in one serving of the food. If you eat more than one serving, you must multiply the listed amount of sodium by the number of servings.  Choose foods with less than 140 mg of sodium per serving.  Avoid foods with 300 mg of sodium or more per serving. Shopping  Look for lower-sodium products, often labeled as "low-sodium" or "no salt added."  Always check the sodium content even if foods are labeled as "unsalted" or "no salt added".  Buy fresh foods. ? Avoid canned foods and premade or frozen meals. ? Avoid canned, cured, or processed meats  Buy breads that have less than 80 mg of sodium per slice. Cooking  Eat more home-cooked food and less restaurant, buffet, and fast food.  Avoid adding salt when cooking. Use salt-free seasonings or herbs instead of table salt or sea salt. Check with your health care provider or pharmacist before using salt substitutes.  Cook with plant-based oils, such as canola, sunflower, or olive oil. Meal planning  When eating at a restaurant, ask that your food be prepared with less salt  or no salt, if possible.  Avoid foods that contain MSG (monosodium glutamate). MSG is sometimes added to [place] food, bouillon, and some canned foods. What foods are recommended? The items listed may not be a complete list. Talk with your dietitian about what dietary choices are best for you. Grains Low-sodium cereals, including oats, puffed wheat and rice, and shredded wheat. Low-sodium crackers. Unsalted rice. Unsalted pasta. Low-sodium bread. Whole-grain breads and whole-grain pasta. Vegetables Fresh or frozen vegetables. "No salt added" canned vegetables. "No salt added" tomato sauce and paste. Low-sodium or reduced-sodium tomato and vegetable juice. Fruits Fresh, frozen, or canned fruit. Fruit juice. Meats and other protein foods Fresh or frozen (no salt added) meat, poultry, seafood, and fish. Low-sodium canned tuna and salmon. Unsalted nuts. Dried peas, beans, and lentils without added salt. Unsalted canned beans. Eggs. Unsalted nut butters. Dairy Milk. Soy milk. Cheese that is naturally low in sodium, such as ricotta cheese, fresh mozzarella, or Swiss cheese Low-sodium or reduced-sodium cheese. Cream cheese. Yogurt. Fats and oils Unsalted butter. Unsalted margarine with no trans fat. Vegetable oils such as canola or olive oils. Seasonings and other foods Fresh and dried herbs and spices. Salt-free seasonings. Low-sodium mustard and ketchup. Sodium-free salad dressing. Sodium-free light mayonnaise. Fresh or refrigerated horseradish. Lemon juice. Vinegar. Homemade, reduced-sodium, or low-sodium soups. Unsalted popcorn and pretzels. Low-salt or salt-free chips. What foods are not recommended? The items listed may not be a complete list. Talk with your dietitian about what dietary choices are best for you. Grains  Instant hot cereals. Bread stuffing, pancake, and biscuit mixes. Croutons. Seasoned rice or pasta mixes. Noodle soup cups. Boxed or frozen macaroni and cheese. Regular salted  crackers. Self-rising flour. Vegetables Sauerkraut, pickled vegetables, and relishes. Olives. [place] fries. Onion rings. Regular canned vegetables (not low-sodium or reduced-sodium). Regular canned tomato sauce and paste (not low-sodium or reduced-sodium). Regular tomato and vegetable juice (not low-sodium or reduced-sodium). Frozen vegetables in sauces. Meats and other protein foods Meat or fish that is salted, canned, smoked, spiced, or pickled. Bacon, ham, sausage, hotdogs, corned beef, chipped beef, packaged lunch meats, salt pork, jerky, pickled herring, anchovies, regular canned tuna, sardines, salted nuts. Dairy Processed cheese and cheese spreads. Cheese curds. Blue cheese. Feta cheese. String cheese. Regular cottage cheese. Buttermilk. Canned milk. Fats and oils Salted butter. Regular margarine. Ghee. Bacon fat. Seasonings and other foods Onion salt, garlic salt, seasoned salt, table salt, and sea salt. Canned and packaged gravies. Worcestershire sauce. Tartar sauce. Barbecue sauce. Teriyaki sauce. Soy sauce, including reduced-sodium. Steak sauce. Fish sauce. Oyster sauce. Cocktail sauce. Horseradish that you find on the shelf. Regular ketchup and mustard. Meat flavorings and tenderizers. Bouillon cubes. Hot sauce and Tabasco sauce. Premade or packaged marinades. Premade or packaged taco seasonings. Relishes. Regular salad dressings. Salsa. Potato and tortilla chips. Corn chips and puffs. Salted popcorn and pretzels. Canned or dried soups. Pizza. Frozen entrees and pot pies. Summary  Eating less sodium can help lower your blood pressure, reduce swelling, and protect your heart, liver, and kidneys.  Most people on this plan should limit their sodium intake to 1,500-2,000 mg (milligrams) of sodium each day.  Canned, boxed, and frozen foods are high in sodium. Restaurant foods, fast foods, and pizza are also very high in sodium. You also get sodium by adding salt to food.  Try to cook at  home, eat more fresh fruits and vegetables, and eat less fast food, canned, processed, or prepared foods. This information is not intended to replace advice given to you by your health care provider. Make sure you discuss any questions you have with your health care provider. Document Revised: 06/03/2017 Document Reviewed: 06/14/2016 Elsevier Patient Education  2020 [employer].

## 2020-04-11 LAB — COMPREHENSIVE METABOLIC PANEL
AG Ratio: 1.3 (calc) (ref 1.0–2.5)
ALT: 75 U/L — ABNORMAL HIGH (ref 9–46)
AST: 71 U/L — ABNORMAL HIGH (ref 10–40)
Albumin: 4.6 g/dL (ref 3.6–5.1)
Alkaline phosphatase (APISO): 115 U/L (ref 36–130)
BUN: 10 mg/dL (ref 7–25)
CO2: 21 mmol/L (ref 20–32)
Calcium: 9.9 mg/dL (ref 8.6–10.3)
Chloride: 98 mmol/L (ref 98–110)
Creat: 0.71 mg/dL (ref 0.60–1.35)
Globulin: 3.6 g/dL (calc) (ref 1.9–3.7)
Glucose, Bld: 93 mg/dL (ref 65–99)
Potassium: 4 mmol/L (ref 3.5–5.3)
Sodium: 137 mmol/L (ref 135–146)
Total Bilirubin: 0.8 mg/dL (ref 0.2–1.2)
Total Protein: 8.2 g/dL — ABNORMAL HIGH (ref 6.1–8.1)

## 2020-04-11 LAB — SPECIMEN COMPROMISED

## 2020-04-11 LAB — TEST AUTHORIZATION

## 2020-04-11 LAB — HEPATITIS C ANTIBODY
Hepatitis C Ab: NONREACTIVE
SIGNAL TO CUT-OFF: 0.01 (ref <1.00)

## 2020-04-11 LAB — HEPATITIS B SURFACE ANTIGEN: Hepatitis B Surface Ag: NONREACTIVE

## 2020-04-15 ENCOUNTER — Telehealth: Payer: Self-pay

## 2020-04-15 DIAGNOSIS — R7401 Elevation of levels of liver transaminase levels: Secondary | ICD-10-CM

## 2020-04-15 NOTE — Telephone Encounter (Signed)
-----   Message from Tammi Sou, MD sent at 04/12/2020 12:19 PM EDT ----- All labs normal except liver enzymes are elevated a little more than in the past. I checked viral hepatitis labs to make sure they were negative and they are NEG. His liver enzyme elevation is likely due to "fatty liver"-->some of the normal liver cells get replaced by fat cells-->a common occurrence in people with high cholesterol levels and who are overweight.  I would like him to get a liver ultrasound to try to confirm this and make sure no other abnormalities are found with the liver.  If pt agreeable, pls order ultrasound of abdomen "limited, just right upper quadrant", whatever imaging location he wants, and dx is elevated transaminase level. -thx

## 2020-04-15 NOTE — Telephone Encounter (Signed)
Spoke with pt regarding labs and instructions. Pt agrees to liver US.

## 2020-04-22 ENCOUNTER — Ambulatory Visit: Payer: No Typology Code available for payment source | Admitting: Family Medicine

## 2020-04-23 ENCOUNTER — Ambulatory Visit: Payer: No Typology Code available for payment source | Admitting: Family Medicine

## 2020-04-24 ENCOUNTER — Other Ambulatory Visit: Payer: Self-pay | Admitting: Family Medicine

## 2020-04-24 ENCOUNTER — Ambulatory Visit (HOSPITAL_BASED_OUTPATIENT_CLINIC_OR_DEPARTMENT_OTHER): Payer: No Typology Code available for payment source

## 2020-04-24 DIAGNOSIS — I10 Essential (primary) hypertension: Secondary | ICD-10-CM

## 2020-05-02 ENCOUNTER — Other Ambulatory Visit: Payer: Self-pay | Admitting: Family Medicine

## 2020-05-05 ENCOUNTER — Ambulatory Visit (HOSPITAL_BASED_OUTPATIENT_CLINIC_OR_DEPARTMENT_OTHER): Admission: RE | Admit: 2020-05-05 | Payer: No Typology Code available for payment source | Source: Ambulatory Visit

## 2020-05-06 ENCOUNTER — Other Ambulatory Visit: Payer: Self-pay

## 2020-05-06 ENCOUNTER — Encounter: Payer: Self-pay | Admitting: Family Medicine

## 2020-05-06 ENCOUNTER — Ambulatory Visit (INDEPENDENT_AMBULATORY_CARE_PROVIDER_SITE_OTHER): Payer: No Typology Code available for payment source | Admitting: Family Medicine

## 2020-05-06 VITALS — BP 137/82 | HR 80 | Temp 98.0°F | Resp 16 | Ht 70.5 in | Wt 249.8 lb

## 2020-05-06 DIAGNOSIS — R7401 Elevation of levels of liver transaminase levels: Secondary | ICD-10-CM | POA: Diagnosis not present

## 2020-05-06 DIAGNOSIS — I10 Essential (primary) hypertension: Secondary | ICD-10-CM | POA: Diagnosis not present

## 2020-05-06 MED ORDER — METOPROLOL SUCCINATE ER 50 MG PO TB24: 50.0000 mg | ORAL_TABLET | Freq: Every day | ORAL | 0 refills | Status: DC

## 2020-05-06 NOTE — Progress Notes (Addendum)
OFFICE VISIT  05/06/2020  CC:  Chief Complaint  Patient presents with  . Follow-up    HTN   HPI:    Patient is a 44 y.o. Caucasian male who presents for 3 week f/u uncontrolled HTN. A/P as of last visit: "1) uncontrolled HTN:  Add toprol xl 25.  Continue irbes/hctz 150-12.5, two bid and amlodipine 66m qd. Monitor bp's and f/u to review in 2 wks.  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 Low sodium intake discussed/dietary handout given, elevate legs.  3) IFG: [place] Nonfasting glucose check today.  4) HLD: tolerating prava 48mqd, continue this. Lipids good 12/2019.  5) Hx elevated LFTs: very mild.  Suspect fatty liver. monitor hepatic panel today."  INTERIM HX: LFTs a bit up still last visit, viral hep serologies neg. Abd u/s scheduled for 05/08/20.  toprol xl 253md initially caused itching, no rash.  He stopped the med a couple days and the itching resolved completely.  He restarted it and itching returned but not as bad and now it has gone away. Home BPs: avg 140/90 still.  HR avg 80. Says all his life he's had a discrepancy between bp's in each arm, sometimes L arm higher than R and sometimes opposite.  Sometimes the diff is as much as 30 points syst per pt report.  He has cut out colas/fructos drinks and lowered salt intake a lot. Has lost a few lbs in last few weeks.  Past Medical History:  Diagnosis Date  . Atypical chest pain since 2007   w/u has included a CT angio chest (neg), myoview (normal), EGD  . Delayed gastric emptying   . Elevated transaminase level    viral hep serologies NEG.  Suspect fatty liver--u/s ordered 05/2020.  . GMarland KitchenRD (gastroesophageal reflux disease)   . Hyperlipidemia    Intol of atorv, rosuva, lovalo, and zetia.  Good on pravastatin.  . HMarland Kitchenpertension   . IFG (impaired fasting glucose) 2016   HbA1c 5.6%.  Repeat was 5.5% 01/2016.  Repeat 03/2017 5.8%.  01/2018 A1c 6.0%.. 03/2019 A1c 5.7%.   . Insomnia   . Obesity, Class II, BMI 35-39.9     Past Surgical History:  Procedure Laterality Date  . CARDIOVASCULAR STRESS TEST  07/2011   Normal, excellent exercise capacity  . gastric emptying scan  2007   Delayed--reglan started    Outpatient Medications Prior to Visit  Medication Sig Dispense Refill  . amLODipine (NORVASC) 10 MG tablet TAKE 1 TABLET BY MOUTH EVERY DAY 30 tablet 0  . esomeprazole (NEXIUM) 40 MG capsule Take 1 capsule (40 mg total) by mouth 2 (two) times daily. 60 capsule 0  . irbesartan-hydrochlorothiazide (AVALIDE) 150-12.5 MG tablet Take 2 tablets by mouth daily. 180 tablet 3  . metoCLOPramide (REGLAN) 10 MG tablet Take 1 tablet (10 mg total) by mouth 2 (two) times daily. 60 tablet 0  . Multiple Vitamin (MULTIVITAMIN) tablet Take 1 tablet by mouth daily.    . pravastatin (PRAVACHOL) 40 MG tablet TAKE 1 TABLET BY MOUTH EVERY DAY 90 tablet 1  . metoprolol succinate (TOPROL XL) 25 MG 24 hr tablet Take 1 tablet (25 mg total) by mouth daily. 30 tablet 1   No facility-administered medications prior to visit.    Allergies  Allergen Reactions  . Ciprofloxacin     REACTION: Nausea    ROS As per HPI  PE: Vitals with BMI 05/06/2020 04/09/2020 10/29/2019  Height 5' 10.5" 5'  10.5" -  Weight 249 lbs 13 oz 252 lbs 3 oz 247 lbs  BMI 72.25 75.05 -  Systolic 183 358 251  Diastolic 82 83 89  Pulse 80 89 -  BP L arm 134/82 BP R arm 124/80   Gen: Alert, well appearing.  Patient is oriented to person, place, time, and situation. AFFECT: pleasant, lucid thought and speech. CV: RRR, no m/r/g.   LUNGS: CTA bilat, nonlabored resps, good aeration in all lung fields. Radial pulses 2+ bilat, no delay   LABS:  Lab Results  Component Value Date   TSH 3.52 04/04/2019   Lab Results  Component Value Date   WBC 6.3 12/24/2019   HGB 14.9 12/24/2019   HCT 44.5 12/24/2019   MCV 92.5 12/24/2019   PLT 204 12/24/2019   Lab Results  Component Value Date   CREATININE  0.71 04/09/2020   BUN 10 04/09/2020   NA 137 04/09/2020   K 4.0 04/09/2020   CL 98 04/09/2020   CO2 21 04/09/2020   Lab Results  Component Value Date   ALT 75 (H) 04/09/2020   AST 71 (H) 04/09/2020   ALKPHOS 100 01/19/2018   BILITOT 0.8 04/09/2020   Lab Results  Component Value Date   CHOL 190 12/24/2019   Lab Results  Component Value Date   HDL 75 12/24/2019   Lab Results  Component Value Date   LDLCALC 100 (H) 12/24/2019   Lab Results  Component Value Date   TRIG 61 12/24/2019   Lab Results  Component Value Date   CHOLHDL 2.5 12/24/2019   Lab Results  Component Value Date   HGBA1C 5.5 12/24/2019   IMPRESSION AND PLAN:  1) Uncontrolled HTN. Inc toprol xl to 73m qd.  If itching returns and won't dissipate then he'll stop med and let me know. Cont irbes-hct 150/25, 2 tabs qd. Cont amlod 114mqd. Great job with lower Na diet and cutting back on complex carbs. Suspect the asymmetry of his bp's is at least partially error from arms not held consistently at same level when checking.  2) Elev transaminases: fatty liver suspected. [place]abd to be done in 2 d.  An After Visit Summary was printed and given to the patient.  FOLLOW UP: Return for 3-4 wks f/u HTN.  Signed:  PhCrissie SicklesMD           05/06/2020

## 2020-05-08 ENCOUNTER — Other Ambulatory Visit: Payer: No Typology Code available for payment source

## 2020-05-09 ENCOUNTER — Other Ambulatory Visit: Payer: Self-pay

## 2020-05-09 DIAGNOSIS — I10 Essential (primary) hypertension: Secondary | ICD-10-CM

## 2020-05-09 MED ORDER — AMLODIPINE BESYLATE 10 MG PO TABS: ORAL_TABLET | ORAL | 0 refills | Status: DC

## 2020-05-16 ENCOUNTER — Other Ambulatory Visit: Payer: Self-pay

## 2020-05-16 ENCOUNTER — Ambulatory Visit (HOSPITAL_BASED_OUTPATIENT_CLINIC_OR_DEPARTMENT_OTHER)
Admission: RE | Admit: 2020-05-16 | Discharge: 2020-05-16 | Disposition: A | Discharge status: Home / Self Care | Payer: No Typology Code available for payment source | Source: Ambulatory Visit | Attending: Family Medicine | Admitting: Family Medicine

## 2020-05-16 DIAGNOSIS — R7401 Elevation of levels of liver transaminase levels: Secondary | ICD-10-CM | POA: Diagnosis not present

## 2020-05-19 ENCOUNTER — Encounter: Payer: Self-pay | Admitting: Family Medicine

## 2020-05-30 ENCOUNTER — Other Ambulatory Visit: Payer: Self-pay | Admitting: Family Medicine

## 2020-06-27 ENCOUNTER — Other Ambulatory Visit: Payer: Self-pay | Admitting: Family Medicine

## 2020-07-29 ENCOUNTER — Other Ambulatory Visit: Payer: Self-pay | Admitting: Family Medicine

## 2020-08-07 ENCOUNTER — Other Ambulatory Visit: Payer: Self-pay | Admitting: Family Medicine

## 2020-08-07 DIAGNOSIS — I10 Essential (primary) hypertension: Secondary | ICD-10-CM

## 2020-08-21 ENCOUNTER — Other Ambulatory Visit: Payer: Self-pay | Admitting: Family Medicine

## 2020-08-21 DIAGNOSIS — I10 Essential (primary) hypertension: Secondary | ICD-10-CM

## 2020-09-01 ENCOUNTER — Other Ambulatory Visit: Payer: Self-pay | Admitting: Family Medicine

## 2020-09-01 DIAGNOSIS — I10 Essential (primary) hypertension: Secondary | ICD-10-CM

## 2020-09-02 ENCOUNTER — Telehealth: Payer: Self-pay | Admitting: Family Medicine

## 2020-09-02 NOTE — Telephone Encounter (Signed)
Spoke with patient and advised 90 day supply sent this once due to insurance coverage. He could keep appt scheduled for 4/14

## 2020-09-02 NOTE — Telephone Encounter (Signed)
Patient requesting refill of amlodipine, has one pill left and is going out of town tomorrow. He scheduled followup appt 10/16/20 based on his work schedule and would like refill enough to get through to that appt. Please send to same [facility] in [place].

## 2020-09-09 ENCOUNTER — Other Ambulatory Visit: Payer: Self-pay | Admitting: Family Medicine

## 2020-10-04 ENCOUNTER — Other Ambulatory Visit: Payer: Self-pay | Admitting: Family Medicine

## 2020-10-08 ENCOUNTER — Other Ambulatory Visit: Payer: Self-pay | Admitting: Family Medicine

## 2020-10-11 ENCOUNTER — Other Ambulatory Visit: Payer: Self-pay | Admitting: Family Medicine

## 2020-10-15 ENCOUNTER — Encounter: Payer: Self-pay | Admitting: Family Medicine

## 2020-10-15 NOTE — Progress Notes (Deleted)
OFFICE VISIT  10/15/2020  CC: No chief complaint on file.   HPI:    Patient is a 45 y.o. Caucasian male who presents for 5 mo f/u HTN, HLD, and NASH. A/P as of last visit: "1) Uncontrolled HTN. Inc toprol xl to 30m qd.  If itching returns and won't dissipate then he'll stop med and let me know. Cont irbes-hct 150/25, 2 tabs qd. Cont amlod 144mqd. Great job with lower Na diet and cutting back on complex carbs. Suspect the asymmetry of his bp's is at least partially error from arms not held consistently at same level when checking.  2) Elev transaminases: fatty liver suspected. USKoreabd to be done in 2 d."  INTERIM HX: ***  HTN: pt was to f/u for bp recheck 3 wks after last visit but did not do so.  Elevated transaminases: RUQ u/s last visit did confirm fatty liver.  Past Medical History:  Diagnosis Date  . Atypical chest pain since 2007   w/u has included a CT angio chest (neg), myoview (normal), EGD  . Delayed gastric emptying   . Elevated transaminase level    viral hep serologies NEG.  Suspect fatty liver--u/s ordered 05/2020.  . Marland Kitchenatty liver    on u/s abd 05/2020  . GERD (gastroesophageal reflux disease)   . Hyperlipidemia    Intol of atorv, rosuva, lovalo, and zetia.  Good on pravastatin.  . Marland Kitchenypertension   . IFG (impaired fasting glucose) 2016   HbA1c 5.6%.  Repeat was 5.5% 01/2016.  Repeat 03/2017 5.8%.  01/2018 A1c 6.0%.. 03/2019 A1c 5.7%.  . Insomnia   . Obesity, Class II, BMI 35-39.9     Past Surgical History:  Procedure Laterality Date  . CARDIOVASCULAR STRESS TEST  07/2011   Normal, excellent exercise capacity  . gastric emptying scan  2007   Delayed--reglan started    Outpatient Medications Prior to Visit  Medication Sig Dispense Refill  . amLODipine (NORVASC) 10 MG tablet TAKE 1 TABLET BY MOUTH EVERY DAY 90 tablet 0  . esomeprazole (NEXIUM) 40 MG capsule Take 1 capsule (40 mg total) by mouth 2 (two) times daily. 60 capsule 0  .  irbesartan-hydrochlorothiazide (AVALIDE) 150-12.5 MG tablet TAKE 2 TABLETS BY MOUTH EVERY DAY 180 tablet 0  . metoCLOPramide (REGLAN) 10 MG tablet TAKE 1 TABLET BY MOUTH TWICE A DAY 60 tablet 5  . metoprolol succinate (TOPROL-XL) 50 MG 24 hr tablet TAKE 1 TABLET BY MOUTH DAILY. TAKE WITH OR IMMEDIATELY FOLLOWING A MEAL. 30 tablet 5  . Multiple Vitamin (MULTIVITAMIN) tablet Take 1 tablet by mouth daily.    . pravastatin (PRAVACHOL) 40 MG tablet TAKE 1 TABLET BY MOUTH EVERY DAY 14 tablet 0   No facility-administered medications prior to visit.    Allergies  Allergen Reactions  . Ciprofloxacin     REACTION: Nausea    ROS As per HPI  PE: Vitals with BMI 05/06/2020 04/09/2020 10/29/2019  Height 5' 10.5" 5' 10.5" -  Weight 249 lbs 13 oz 252 lbs 3 oz 247 lbs  BMI 3515.05569.79  Systolic [id number] 82 83 89  Pulse 80 89 -     ***  LABS:    Chemistry      Component Value Date/Time   NA 137 04/09/2020 0847   K 4.0 04/09/2020 0847   CL 98 04/09/2020 0847   CO2 21 04/09/2020 0847   BUN 10 04/09/2020 0847   CREATININE 0.71 04/09/2020 0847  Component Value Date/Time   CALCIUM 9.9 04/09/2020 0847   ALKPHOS 100 01/19/2018 0816   AST 71 (H) 04/09/2020 0847   ALT 75 (H) 04/09/2020 0847   BILITOT 0.8 04/09/2020 0847     Lab Results  Component Value Date   CHOL 190 12/24/2019   HDL 75 12/24/2019   LDLCALC 100 (H) 12/24/2019   LDLDIRECT 176.7 07/12/2013   TRIG 61 12/24/2019   CHOLHDL 2.5 12/24/2019     IMPRESSION AND PLAN:  No problem-specific Assessment & Plan notes found for this encounter.   An After Visit Summary was printed and given to the patient.  FOLLOW UP: No follow-ups on file.  Signed:  Crissie Sickles, MD           10/15/2020

## 2020-10-16 ENCOUNTER — Ambulatory Visit: Payer: No Typology Code available for payment source | Admitting: Family Medicine

## 2020-10-16 DIAGNOSIS — I10 Essential (primary) hypertension: Secondary | ICD-10-CM

## 2020-10-16 DIAGNOSIS — E7800 Pure hypercholesterolemia, unspecified: Secondary | ICD-10-CM

## 2020-10-16 DIAGNOSIS — K7581 Nonalcoholic steatohepatitis (NASH): Secondary | ICD-10-CM

## 2020-10-23 ENCOUNTER — Other Ambulatory Visit: Payer: Self-pay | Admitting: Family Medicine

## 2020-11-02 DIAGNOSIS — E038 Other specified hypothyroidism: Secondary | ICD-10-CM

## 2020-11-02 HISTORY — DX: Other specified hypothyroidism: E03.8

## 2020-11-17 ENCOUNTER — Other Ambulatory Visit: Payer: Self-pay

## 2020-11-17 MED ORDER — PRAVASTATIN SODIUM 40 MG PO TABS
40.0000 mg | ORAL_TABLET | Freq: Every day | ORAL | 0 refills | Status: DC
Start: 2020-11-17 — End: 2020-12-02

## 2020-12-02 ENCOUNTER — Other Ambulatory Visit: Payer: Self-pay

## 2020-12-02 ENCOUNTER — Ambulatory Visit (INDEPENDENT_AMBULATORY_CARE_PROVIDER_SITE_OTHER): Payer: No Typology Code available for payment source | Admitting: Family Medicine

## 2020-12-02 ENCOUNTER — Encounter: Payer: Self-pay | Admitting: Family Medicine

## 2020-12-02 VITALS — BP 117/73 | HR 80 | Temp 98.0°F | Resp 16 | Ht 70.5 in | Wt 252.8 lb

## 2020-12-02 DIAGNOSIS — K7581 Nonalcoholic steatohepatitis (NASH): Secondary | ICD-10-CM

## 2020-12-02 DIAGNOSIS — Z0000 Encounter for general adult medical examination without abnormal findings: Secondary | ICD-10-CM | POA: Diagnosis not present

## 2020-12-02 DIAGNOSIS — R7301 Impaired fasting glucose: Secondary | ICD-10-CM

## 2020-12-02 DIAGNOSIS — I10 Essential (primary) hypertension: Secondary | ICD-10-CM

## 2020-12-02 DIAGNOSIS — E7800 Pure hypercholesterolemia, unspecified: Secondary | ICD-10-CM

## 2020-12-02 DIAGNOSIS — Z1211 Encounter for screening for malignant neoplasm of colon: Secondary | ICD-10-CM

## 2020-12-02 LAB — COMPREHENSIVE METABOLIC PANEL
ALT: 48 U/L (ref 0–53)
AST: 57 U/L — ABNORMAL HIGH (ref 0–37)
Albumin: 4.5 g/dL (ref 3.5–5.2)
Alkaline Phosphatase: 126 U/L — ABNORMAL HIGH (ref 39–117)
BUN: 11 mg/dL (ref 6–23)
CO2: 24 mEq/L (ref 19–32)
Calcium: 9.4 mg/dL (ref 8.4–10.5)
Chloride: 101 mEq/L (ref 96–112)
Creatinine, Ser: 0.71 mg/dL (ref 0.40–1.50)
GFR: 110.81 mL/min (ref >60.00)
Glucose, Bld: 109 mg/dL — ABNORMAL HIGH (ref 70–99)
Potassium: 4.2 mEq/L (ref 3.5–5.1)
Sodium: 138 mEq/L (ref 135–145)
Total Bilirubin: 0.8 mg/dL (ref 0.2–1.2)
Total Protein: 7.8 g/dL (ref 6.0–8.3)

## 2020-12-02 LAB — HEMOGLOBIN A1C: Hgb A1c MFr Bld: 5.9 % (ref 4.6–6.5)

## 2020-12-02 LAB — CBC WITH DIFFERENTIAL/PLATELET
Basophils Absolute: 0 10*3/uL (ref 0.0–0.1)
Basophils Relative: 0.5 % (ref 0.0–3.0)
Eosinophils Absolute: 0.1 10*3/uL (ref 0.0–0.7)
Eosinophils Relative: 1.2 % (ref 0.0–5.0)
HCT: 45.3 % (ref 39.0–52.0)
Hemoglobin: 15.2 g/dL (ref 13.0–17.0)
Lymphocytes Relative: 12.5 % (ref 12.0–46.0)
Lymphs Abs: 0.8 10*3/uL (ref 0.7–4.0)
MCHC: 33.5 g/dL (ref 30.0–36.0)
MCV: 93.7 fl (ref 78.0–100.0)
Monocytes Absolute: 0.7 10*3/uL (ref 0.1–1.0)
Monocytes Relative: 11.2 % (ref 3.0–12.0)
Neutro Abs: 5 10*3/uL (ref 1.4–7.7)
Neutrophils Relative %: 74.6 % (ref 43.0–77.0)
Platelets: 230 10*3/uL (ref 150.0–400.0)
RBC: 4.83 Mil/uL (ref 4.22–5.81)
RDW: 12.1 % (ref 11.5–15.5)
WBC: 6.7 10*3/uL (ref 4.0–10.5)

## 2020-12-02 LAB — TSH: TSH: 6.77 u[IU]/mL — ABNORMAL HIGH (ref 0.35–4.50)

## 2020-12-02 LAB — LIPID PANEL
Cholesterol: 178 mg/dL (ref 0–200)
HDL: 53.6 mg/dL (ref >39.00)
LDL Cholesterol: 111 mg/dL — ABNORMAL HIGH (ref 0–99)
NonHDL: 124.21
Total CHOL/HDL Ratio: 3
Triglycerides: 66 mg/dL (ref 0.0–149.0)
VLDL: 13.2 mg/dL (ref 0.0–40.0)

## 2020-12-02 MED ORDER — PRAVASTATIN SODIUM 40 MG PO TABS
40.0000 mg | ORAL_TABLET | Freq: Every day | ORAL | 3 refills | Status: DC
Start: 2020-12-02 — End: 2021-12-15

## 2020-12-02 MED ORDER — METOCLOPRAMIDE HCL 10 MG PO TABS
10.0000 mg | ORAL_TABLET | Freq: Two times a day (BID) | ORAL | 3 refills | Status: DC
Start: 2020-12-02 — End: 2022-03-02

## 2020-12-02 NOTE — Patient Instructions (Signed)

## 2020-12-02 NOTE — Progress Notes (Signed)
Office Note 12/02/2020  CC:  Chief Complaint  Patient presents with  . Follow-up    Hypertension. Pt is fasting    HPI:  Carlos James is a 45 y.o. White male who is here for annual health maintenance exam and 6 mo f/u HTN, HLD, IFG, NASH. A/P as of last visit: "1) Uncontrolled HTN. Inc toprol xl to 93m qd.  If itching returns and won't dissipate then he'll stop med and let me know. Cont irbes-hct 150/25, 2 tabs qd. Cont amlod 134mqd. Great job with lower Na diet and cutting back on complex carbs. Suspect the asymmetry of his bp's is at least partially error from arms not held consistently at same level when checking.  2) Elev transaminases: fatty liver suspected. USKoreabd to be done in 2 d."  INTERIM HX: Feeling well, no complaints, working hard, [place]: no home monitoring. Takes amlod 1069mirbes-hct 150-12.5, 2 qd and toprol xl 50 qd.  HLD: takes prava 40 qd.    Diet: dec calorie consumption, more water intake. 12K steps-day.   Past Medical History:  Diagnosis Date  . Atypical chest pain since 2007   w/u has included a CT angio chest (neg), myoview (normal), EGD  . Delayed gastric emptying   . GERD (gastroesophageal reflux disease)   . Hyperlipidemia    Intol of atorv, rosuva, lovalo, and zetia.  Good on pravastatin.  . HMarland Kitchenpertension   . IFG (impaired fasting glucose) 2016   HbA1c 5.6%.  Repeat was 5.5% 01/2016.  Repeat 03/2017 5.8%.  01/2018 A1c 6.0%.. 03/2019 A1c 5.7%.  . Insomnia   . NASH (nonalcoholic steatohepatitis)    mild elev LFTs, viral hep serologies NEG.  Fatty liver on u/s 05/2020.  . Obesity, Class II, BMI 35-39.9     Past Surgical History:  Procedure Laterality Date  . CARDIOVASCULAR STRESS TEST  07/2011   Normal, excellent exercise capacity  . gastric emptying scan  2007   Delayed--reglan started    Family History  Problem Relation Age of Onset  . Coronary artery disease Other   .  Arthritis Mother   . Heart disease Mother   . Hyperlipidemia Mother   . Arthritis Father   . Heart disease Father   . Hyperlipidemia Father     Social History   Socioeconomic History  . Marital status: Married    Spouse name: Not on file  . Number of children: Not on file  . Years of education: Not on file  . Highest education level: Not on file  Occupational History  . Not on file  Tobacco Use  . Smoking status: Former [employer] scientist (life sciences) Smokeless tobacco: Current User  . Tobacco comment: uses 1 can a day, was a periodic smoker, but not now.  Substance and Sexual Activity  . Alcohol use: Yes  . Drug use: No  . Sexual activity: Yes  Other Topics Concern  . Not on file  Social History Narrative   Married, 2 chldren.   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  No smoking.  Alcohol intake: 2-3 beers a day on his days off.            Social Determinants of Health   Financial Resource Strain: Not on file  Food Insecurity: Not on file  Transportation Needs: Not on file  Physical Activity: Not on file  Stress: Not on file  Social  Connections: Not on file  Intimate Partner Violence: Not on file    Outpatient Medications Prior to Visit  Medication Sig Dispense Refill  . amLODipine (NORVASC) 10 MG tablet TAKE 1 TABLET BY MOUTH EVERY DAY 90 tablet 0  . esomeprazole (NEXIUM) 40 MG capsule Take 1 capsule (40 mg total) by mouth 2 (two) times daily. 60 capsule 0  . irbesartan-hydrochlorothiazide (AVALIDE) 150-12.5 MG tablet TAKE 2 TABLETS BY MOUTH EVERY DAY 180 tablet 0  . metoprolol succinate (TOPROL-XL) 50 MG 24 hr tablet TAKE 1 TABLET BY MOUTH DAILY. TAKE WITH OR IMMEDIATELY FOLLOWING A MEAL. 30 tablet 5  . Multiple Vitamin (MULTIVITAMIN) tablet Take 1 tablet by mouth daily.    . metoCLOPramide (REGLAN) 10 MG tablet TAKE 1 TABLET BY MOUTH TWICE A DAY 60 tablet 5  . pravastatin (PRAVACHOL) 40 MG tablet Take 1 tablet (40 mg total) by mouth daily. 16  tablet 0   No facility-administered medications prior to visit.    Allergies  Allergen Reactions  . Ciprofloxacin     REACTION: Nausea    ROS Review of Systems  Constitutional: Negative for appetite change, chills, fatigue and fever.  HENT: Negative for congestion, dental problem, ear pain and sore throat.   Eyes: Negative for discharge, redness and visual disturbance.  Respiratory: Negative for cough, chest tightness, shortness of breath and wheezing.   Cardiovascular: Negative for chest pain, palpitations and leg swelling.  Gastrointestinal: Negative for abdominal pain, blood in stool, diarrhea, nausea and vomiting.  Genitourinary: Negative for difficulty urinating, dysuria, flank pain, frequency, hematuria and urgency.  Musculoskeletal: Negative for arthralgias, back pain, joint swelling, myalgias and neck stiffness.  Skin: Negative for pallor and rash.  Neurological: Negative for dizziness, speech difficulty, weakness and headaches.  Hematological: Negative for adenopathy. Does not bruise/bleed easily.  Psychiatric/Behavioral: Negative for confusion and sleep disturbance. The patient is not nervous/anxious.     PE; Vitals with BMI 12/02/2020 05/06/2020 04/09/2020  Height 5' 10.5" 5' 10.5" 5' 10.5"  Weight 252 lbs 13 oz 249 lbs 13 oz 252 lbs 3 oz  BMI 35.75 01.74 94.49  Systolic 675 916 384  Diastolic 73 82 83  Pulse 80 80 89   Gen: Alert, well appearing.  Patient is oriented to person, place, time, and situation. AFFECT: pleasant, lucid thought and speech. ENT: Ears: EACs clear, normal epithelium.  TMs with good light reflex and landmarks bilaterally.  Eyes: no injection, icteris, swelling, or exudate.  EOMI, PERRLA. Nose: no drainage or turbinate edema/swelling.  No injection or focal lesion.  Mouth: lips without lesion/swelling.  Oral mucosa pink and moist.  Dentition intact and without obvious caries or gingival swelling.  Oropharynx without erythema, exudate, or swelling.   Neck: supple/nontender.  No LAD, mass, or TM.  Carotid pulses 2+ bilaterally, without bruits. CV: RRR, no m/r/g.   LUNGS: CTA bilat, nonlabored resps, good aeration in all lung fields. ABD: soft, NT, ND, BS normal.  No hepatospenomegaly or mass.  No bruits. EXT: no clubbing, cyanosis, or edema.  Musculoskeletal: no joint swelling, erythema, warmth, or tenderness.  ROM of all joints intact. Skin - no sores or suspicious lesions or rashes or color changes   Pertinent labs:  Lab Results  Component Value Date   TSH 3.52 04/04/2019   Lab Results  Component Value Date   WBC 6.3 12/24/2019   HGB 14.9 12/24/2019   HCT 44.5 12/24/2019   MCV 92.5 12/24/2019   PLT 204 12/24/2019   Lab Results  Component Value  Date   CREATININE 0.71 04/09/2020   BUN 10 04/09/2020   NA 137 04/09/2020   K 4.0 04/09/2020   CL 98 04/09/2020   CO2 21 04/09/2020   Lab Results  Component Value Date   ALT 75 (H) 04/09/2020   AST 71 (H) 04/09/2020   ALKPHOS 100 01/19/2018   BILITOT 0.8 04/09/2020   Lab Results  Component Value Date   CHOL 190 12/24/2019   Lab Results  Component Value Date   HDL 75 12/24/2019   Lab Results  Component Value Date   LDLCALC 100 (H) 12/24/2019   Lab Results  Component Value Date   TRIG 61 12/24/2019   Lab Results  Component Value Date   CHOLHDL 2.5 12/24/2019   Lab Results  Component Value Date   HGBA1C 5.5 12/24/2019   ASSESSMENT AND PLAN:   1) HTN: great bp today here.   Encouraged him to monitor a couple days a week at home to ensure stability. Cont amolod 10 qd, irbesartan-hct 150-12.5 2 tabs qd, and toprol xl 47m qd. Lytes/cr today.  2) HLD: compliant with pravastatin 419mqd. FLP and hepatic panel today.  3) IFG, +Fatty liver (+hx of elevated transaminases): he is gradually working on improved diet and is active through his work but would benefit from stricter calorie reduction and increased exercise--he cites hx of knee pain with running as a  barrier. Hba1c and hepatic panel today.  4) Health maintenance exam: Reviewed age and gender appropriate health maintenance issues (prudent diet, regular exercise, health risks of tobacco and excessive alcohol, use of seatbelts, fire alarms in home, use of sunscreen).  Also reviewed age and gender appropriate health screening as well as vaccine recommendations. Vaccines: Tdap UTD.   Labs: fasting HP, Hba1c. Prostate ca screening: average risk patient= as per latest guidelines, start screening at 5038rs of age. Colon ca screening: average risk patient= as per latest guidelines, start screening at any time now--> pt chooses iFOB today.  An After Visit Summary was printed and given to the patient.  FOLLOW UP:  Return in about 6 months (around 06/03/2021) for routine chronic illness f/u.  Signed:  PhCrissie SicklesMD           12/02/2020

## 2020-12-03 ENCOUNTER — Encounter: Payer: Self-pay | Admitting: Family Medicine

## 2020-12-03 ENCOUNTER — Other Ambulatory Visit (INDEPENDENT_AMBULATORY_CARE_PROVIDER_SITE_OTHER): Payer: No Typology Code available for payment source

## 2020-12-03 DIAGNOSIS — R7989 Other specified abnormal findings of blood chemistry: Secondary | ICD-10-CM

## 2020-12-03 LAB — T4, FREE: Free T4: 0.91 ng/dL (ref 0.60–1.60)

## 2020-12-03 LAB — T3, FREE: T3, Free: 3.5 pg/mL (ref 2.3–4.2)

## 2020-12-16 ENCOUNTER — Other Ambulatory Visit: Payer: Self-pay | Admitting: Family Medicine

## 2020-12-16 DIAGNOSIS — I10 Essential (primary) hypertension: Secondary | ICD-10-CM

## 2020-12-27 ENCOUNTER — Other Ambulatory Visit: Payer: Self-pay | Admitting: Family Medicine

## 2021-01-03 ENCOUNTER — Other Ambulatory Visit: Payer: Self-pay | Admitting: Family Medicine

## 2021-03-31 ENCOUNTER — Other Ambulatory Visit: Payer: Self-pay

## 2021-03-31 ENCOUNTER — Ambulatory Visit (INDEPENDENT_AMBULATORY_CARE_PROVIDER_SITE_OTHER): Payer: No Typology Code available for payment source

## 2021-03-31 DIAGNOSIS — Z1211 Encounter for screening for malignant neoplasm of colon: Secondary | ICD-10-CM

## 2021-03-31 HISTORY — DX: Encounter for screening for malignant neoplasm of colon: Z12.11

## 2021-03-31 LAB — FECAL OCCULT BLOOD, IMMUNOCHEMICAL: Fecal Occult Bld: NEGATIVE

## 2021-04-01 ENCOUNTER — Encounter: Payer: Self-pay | Admitting: Family Medicine

## 2021-04-04 ENCOUNTER — Other Ambulatory Visit: Payer: Self-pay | Admitting: Family Medicine

## 2021-04-29 ENCOUNTER — Other Ambulatory Visit: Payer: Self-pay | Admitting: Family Medicine

## 2021-05-27 ENCOUNTER — Other Ambulatory Visit: Payer: Self-pay | Admitting: Family Medicine

## 2021-05-29 ENCOUNTER — Other Ambulatory Visit: Payer: Self-pay | Admitting: Family Medicine

## 2021-05-29 DIAGNOSIS — I10 Essential (primary) hypertension: Secondary | ICD-10-CM

## 2021-06-15 ENCOUNTER — Ambulatory Visit: Payer: No Typology Code available for payment source | Admitting: Family Medicine

## 2021-06-19 ENCOUNTER — Telehealth: Payer: Self-pay | Admitting: Family Medicine

## 2021-06-19 DIAGNOSIS — I10 Essential (primary) hypertension: Secondary | ICD-10-CM

## 2021-06-19 MED ORDER — IRBESARTAN-HYDROCHLOROTHIAZIDE 150-12.5 MG PO TABS
2.0000 | ORAL_TABLET | Freq: Every day | ORAL | 0 refills | Status: DC
Start: 2021-06-19 — End: 2021-09-24

## 2021-06-19 MED ORDER — AMLODIPINE BESYLATE 10 MG PO TABS: 10.0000 mg | ORAL_TABLET | Freq: Every day | ORAL | 0 refills | Status: DC

## 2021-06-19 NOTE — Telephone Encounter (Signed)
Spoke with pt and advised metoclopramide already has refills, 12/02/20(180,3). The other 2 medications requested have been sent to the pharmacy. Pt voiced understanding

## 2021-06-19 NOTE — Telephone Encounter (Signed)
Pt refill request [facility] amLODipine (NORVASC) 10 MG tablet [[id number]]   metoCLOPramide (REGLAN) 10 MG tablet [[id number]]   irbesartan-hydrochlorothiazide (AVALIDE) 150-12.5 MG tablet [[id number]]

## 2021-06-30 ENCOUNTER — Telehealth: Payer: Self-pay

## 2021-06-30 NOTE — Telephone Encounter (Signed)
Patient is leaving to out of town on Thursday.  Patient states he is worried about his legs swelling.  He states that by lunch time every day both legs swell.  By morning, they are not swollen.  He does not if its regarding blood pressure or something else.   He is requesting appt tomorrow with Dr. Anitra Lauth OR if he can wait until next appt.  Please call [phone]

## 2021-06-30 NOTE — Telephone Encounter (Signed)
OK to put him on schedule at 4:15 tomorrow (07/01/21) in person

## 2021-06-30 NOTE — Telephone Encounter (Signed)
No current availability for tomorrow unless worked in. Pt's next scheduled appt is not until 1/6  Please review and advise

## 2021-07-01 ENCOUNTER — Ambulatory Visit (INDEPENDENT_AMBULATORY_CARE_PROVIDER_SITE_OTHER): Payer: No Typology Code available for payment source | Admitting: Family Medicine

## 2021-07-01 ENCOUNTER — Other Ambulatory Visit: Payer: Self-pay

## 2021-07-01 ENCOUNTER — Encounter: Payer: Self-pay | Admitting: Family Medicine

## 2021-07-01 VITALS — BP 136/80 | HR 83 | Temp 97.9°F | Ht 70.5 in | Wt 271.4 lb

## 2021-07-01 DIAGNOSIS — R635 Abnormal weight gain: Secondary | ICD-10-CM | POA: Diagnosis not present

## 2021-07-01 DIAGNOSIS — I872 Venous insufficiency (chronic) (peripheral): Secondary | ICD-10-CM | POA: Diagnosis not present

## 2021-07-01 DIAGNOSIS — R600 Localized edema: Secondary | ICD-10-CM

## 2021-07-01 LAB — POCT URINALYSIS DIPSTICK
Bilirubin, UA: NEGATIVE
Blood, UA: NEGATIVE
Glucose, UA: NEGATIVE
Ketones, UA: NEGATIVE
Leukocytes, UA: NEGATIVE
Nitrite, UA: NEGATIVE
Protein, UA: NEGATIVE
Spec Grav, UA: 1.01 (ref 1.010–1.025)
Urobilinogen, UA: 0.2 E.U./dL
pH, UA: 6 (ref 5.0–8.0)

## 2021-07-01 MED ORDER — FUROSEMIDE 20 MG PO TABS: 20.0000 mg | ORAL_TABLET | Freq: Every day | ORAL | 0 refills | Status: DC

## 2021-07-01 NOTE — Progress Notes (Signed)
OFFICE VISIT  07/01/2021  CC:  Chief Complaint  Patient presents with   Leg Swelling    Has changed diet with less salt intake; noticed in the last 3 weeks more swelling than normal. Swelling starts at knees and goes down.    HPI:    Patient is a 45 y.o. male who presents for swelling in legs. Carlos James has a history of mild lower extremity edema but he notes over about the last 2 to 3 weeks significant worsening of this.  From the knees down into the feet both legs he swells and by midday it is bad.  When he wakes up in the morning his legs have no swelling. Also notes he is bellybutton feels like is poking out--feels like he may be retaining fluid in the abdomen.  Hands do not feel swollen. Front of lower legs are pinkish and frankly and itching. No new medications.  He has tried stopping his medications for a few days to see if anything changed and he did not notice a difference. Says diet is not high in sodium.  He states he has actually tried to lower the sodium in his diet lately. Has gained about 20 pounds over the last 6 to 7 months but says he has not had any change in his diet or activity level. He reports feeling fine, even plays basketball full-court 45 minutes without problem. No pain in the lower legs.  No history of DVT.  ROS as above, plus--> no fevers, no CP, no SOB, no wheezing, no cough, no dizziness, no HAs, no rashes, no melena/hematochezia.  No polyuria or polydipsia.  No myalgias or arthralgias.  No focal weakness, paresthesias, or tremors.  No acute vision or hearing abnormalities.  No dysuria or unusual/new urinary urgency or frequency.  No n/v/d or abd pain.  No palpitations.     Past Medical History:  Diagnosis Date   Atypical chest pain since 2007   w/u has included a CT angio chest (neg), myoview (normal), EGD   Colon cancer screening 03/31/2021   03/31/21 iFOB neg. Rpt 1 yr.   Delayed gastric emptying    GERD (gastroesophageal reflux disease)    Hyperlipidemia     Intol of atorv, rosuva, lovalo, and zetia.  Good on pravastatin.   Hypertension    IFG (impaired fasting glucose) 2016   HbA1c 5.6%.  Repeat was 5.5% 01/2016.  Repeat 03/2017 5.8%.  01/2018 A1c 6.0%.. 03/2019 A1c 5.7%.   Insomnia    NASH (nonalcoholic steatohepatitis)    mild elev LFTs, viral hep serologies NEG.  Fatty liver on u/s 05/2020.   Obesity, Class II, BMI 35-39.9    Subclinical hypothyroidism 11/2020   11/2020->TSH very mildly elev, free T4 and T3 total NORMAL.    Past Surgical History:  Procedure Laterality Date   CARDIOVASCULAR STRESS TEST  07/2011   Normal, excellent exercise capacity   gastric emptying scan  2007   Delayed--reglan started    Outpatient Medications Prior to Visit  Medication Sig Dispense Refill   amLODipine (NORVASC) 10 MG tablet Take 1 tablet (10 mg total) by mouth daily. 90 tablet 0   esomeprazole (NEXIUM) 40 MG capsule Take 1 capsule (40 mg total) by mouth 2 (two) times daily. 60 capsule 0   irbesartan-hydrochlorothiazide (AVALIDE) 150-12.5 MG tablet Take 2 tablets by mouth daily. MUST KEEP SCHEDULED OFFICE VISIT 180 tablet 0   metoCLOPramide (REGLAN) 10 MG tablet Take 1 tablet (10 mg total) by mouth 2 (two) times daily. [place]  tablet 3   metoprolol succinate (TOPROL-XL) 50 MG 24 hr tablet TAKE 1 TABLET BY MOUTH DAILY. TAKE WITH OR IMMEDIATELY FOLLOWING A MEAL. 90 tablet 1   Multiple Vitamin (MULTIVITAMIN) tablet Take 1 tablet by mouth daily.     pravastatin (PRAVACHOL) 40 MG tablet Take 1 tablet (40 mg total) by mouth daily. 90 tablet 3   No facility-administered medications prior to visit.    Allergies  Allergen Reactions   Ciprofloxacin     REACTION: Nausea    ROS As per HPI  PE: Vitals with BMI 07/01/2021 12/02/2020 05/06/2020  Height 5' 10.5" 5' 10.5" 5' 10.5"  Weight 271 lbs 6 oz 252 lbs 13 oz 249 lbs 13 oz  BMI 38.38 03.55 97.41  Systolic 638 453 646  Diastolic 80 73 82  Pulse 83 80 80   Physical Exam  General: Alert and  well-appearing. Affect: Pleasant, lucid thought and speech. Cardiovascular: Regular rhythm and rate without murmur rub or gallop.  Lungs are clear to auscultation bilaterally, breathing nonlabored. Abdomen is soft, protuberant/obese, nontender.  No pitting of the abdominal wall.  No organomegaly or mass.  No bruit.  No flank dullness or fluid wave. Lower legs: Right greater than left mildly tense edema, mostly pitting, worst in the ankles. 3-4+ on the right, 2-3+ on the left. Freckling pigmentation changes and mild fibrotic skin changes to both lower legs anteriorly.  Mild pinkish overlying hue to anterior lower legs.  No calf tenderness.  LABS:  Last CBC Lab Results  Component Value Date   WBC 6.7 12/02/2020   HGB 15.2 12/02/2020   HCT 45.3 12/02/2020   MCV 93.7 12/02/2020   MCH 31.0 12/24/2019   RDW 12.1 12/02/2020   PLT 230.0 80/32/1224   Last metabolic panel Lab Results  Component Value Date   GLUCOSE 109 (H) 12/02/2020   NA 138 12/02/2020   K 4.2 12/02/2020   CL 101 12/02/2020   CO2 24 12/02/2020   BUN 11 12/02/2020   CREATININE 0.71 12/02/2020   CALCIUM 9.4 12/02/2020   PROT 7.8 12/02/2020   ALBUMIN 4.5 12/02/2020   BILITOT 0.8 12/02/2020   ALKPHOS 126 (H) 12/02/2020   AST 57 (H) 12/02/2020   ALT 48 12/02/2020   Last thyroid functions Lab Results  Component Value Date   TSH 6.77 (H) 12/02/2020   Lab Results  Component Value Date   CHOL 178 12/02/2020   HDL 53.60 12/02/2020   LDLCALC 111 (H) 12/02/2020   LDLDIRECT 176.7 07/12/2013   TRIG 66.0 12/02/2020   CHOLHDL 3 12/02/2020   UA today: all normal  IMPRESSION AND PLAN:  #1 bilateral lower extremity edema, suspected to be due to worsened chronic venous insufficiency. However, worsening venous insufficiency does not explain the feeling of increased abdominal fluid and his weight gain of 20 pounds over the last 6 months. Will check UA for proteinuria--(this was normal), check c-Met and thyroid  panel. Start Lasix 20 mg daily.  Continue to limit sodium in the diet.  Discussed compression stockings but he recently wore socks and said this was uncomfortable.  This may be something we can add in the future once his swelling is a little better. Very low likelihood that amlodipine is causing this--she has been on this med a long time. If not significantly improved upon follow-up next month then will have vein and vascular specialists see him. Also considering repeat right upper quadrant ultrasound due to his history of fatty liver/NASH.  An After Visit Summary was  printed and given to the patient.  FOLLOW UP: Return for keep appt already set for 07/10/21.  Signed:  Crissie Sickles, MD           07/01/2021

## 2021-07-01 NOTE — Telephone Encounter (Signed)
Spoke with pt regarding appt, he agreed with time slot. Appt scheduled.

## 2021-07-02 ENCOUNTER — Other Ambulatory Visit: Payer: Self-pay | Admitting: Family Medicine

## 2021-07-02 LAB — T4, FREE: Free T4: 0.9 ng/dL (ref 0.60–1.60)

## 2021-07-02 LAB — COMPREHENSIVE METABOLIC PANEL
ALT: 43 U/L (ref 0–53)
AST: 58 U/L — ABNORMAL HIGH (ref 0–37)
Albumin: 4.1 g/dL (ref 3.5–5.2)
Alkaline Phosphatase: 170 U/L — ABNORMAL HIGH (ref 39–117)
BUN: 8 mg/dL (ref 6–23)
CO2: 23 mEq/L (ref 19–32)
Calcium: 8.7 mg/dL (ref 8.4–10.5)
Chloride: 93 mEq/L — ABNORMAL LOW (ref 96–112)
Creatinine, Ser: 0.66 mg/dL (ref 0.40–1.50)
GFR: 112.82 mL/min (ref >60.00)
Glucose, Bld: 115 mg/dL — ABNORMAL HIGH (ref 70–99)
Potassium: 3.9 mEq/L (ref 3.5–5.1)
Sodium: 126 mEq/L — ABNORMAL LOW (ref 135–145)
Total Bilirubin: 0.8 mg/dL (ref 0.2–1.2)
Total Protein: 7.8 g/dL (ref 6.0–8.3)

## 2021-07-02 LAB — T3: T3, Total: 106 ng/dL (ref 76–181)

## 2021-07-02 LAB — TSH: TSH: 3.59 u[IU]/mL (ref 0.35–5.50)

## 2021-07-03 NOTE — Addendum Note (Signed)
Addended by: Octaviano Glow on: 07/03/2021 10:28 AM   Modules accepted: Orders

## 2021-07-08 ENCOUNTER — Other Ambulatory Visit: Payer: Self-pay

## 2021-07-08 ENCOUNTER — Ambulatory Visit (HOSPITAL_BASED_OUTPATIENT_CLINIC_OR_DEPARTMENT_OTHER)
Admission: RE | Admit: 2021-07-08 | Discharge: 2021-07-08 | Disposition: A | Discharge status: Home / Self Care | Payer: No Typology Code available for payment source | Source: Ambulatory Visit | Attending: Family Medicine | Admitting: Family Medicine

## 2021-07-08 ENCOUNTER — Encounter: Payer: Self-pay | Admitting: Family Medicine

## 2021-07-08 DIAGNOSIS — I872 Venous insufficiency (chronic) (peripheral): Secondary | ICD-10-CM | POA: Diagnosis present

## 2021-07-08 DIAGNOSIS — R635 Abnormal weight gain: Secondary | ICD-10-CM | POA: Insufficient documentation

## 2021-07-08 DIAGNOSIS — R600 Localized edema: Secondary | ICD-10-CM | POA: Insufficient documentation

## 2021-07-10 ENCOUNTER — Other Ambulatory Visit: Payer: Self-pay

## 2021-07-10 ENCOUNTER — Encounter: Payer: Self-pay | Admitting: Family Medicine

## 2021-07-10 ENCOUNTER — Ambulatory Visit (INDEPENDENT_AMBULATORY_CARE_PROVIDER_SITE_OTHER): Payer: No Typology Code available for payment source | Admitting: Family Medicine

## 2021-07-10 VITALS — BP 133/84 | HR 84 | Temp 98.3°F | Ht 71.0 in | Wt 262.2 lb

## 2021-07-10 DIAGNOSIS — R0683 Snoring: Secondary | ICD-10-CM

## 2021-07-10 DIAGNOSIS — R600 Localized edema: Secondary | ICD-10-CM

## 2021-07-10 DIAGNOSIS — E871 Hypo-osmolality and hyponatremia: Secondary | ICD-10-CM

## 2021-07-10 DIAGNOSIS — I10 Essential (primary) hypertension: Secondary | ICD-10-CM | POA: Diagnosis not present

## 2021-07-10 DIAGNOSIS — K7581 Nonalcoholic steatohepatitis (NASH): Secondary | ICD-10-CM

## 2021-07-10 DIAGNOSIS — E038 Other specified hypothyroidism: Secondary | ICD-10-CM

## 2021-07-10 DIAGNOSIS — R7303 Prediabetes: Secondary | ICD-10-CM

## 2021-07-10 DIAGNOSIS — E7800 Pure hypercholesterolemia, unspecified: Secondary | ICD-10-CM | POA: Diagnosis not present

## 2021-07-10 DIAGNOSIS — S46111A Strain of muscle, fascia and tendon of long head of biceps, right arm, initial encounter: Secondary | ICD-10-CM

## 2021-07-10 NOTE — Progress Notes (Signed)
OFFICE VISIT  07/10/2021  CC:  Chief Complaint  Patient presents with   Follow-up    RCI; not fasting.    HPI:    Patient is a 46 y.o. male who presents for 6 mo f/u HTN, HLD, prediabetes, NASH, as well as recent LE edema. A/P as of last RCI f/u 6 mo ago: "1) HTN: great bp today here.   Encouraged him to monitor a couple days a week at home to ensure stability. Cont amolod 10 qd, irbesartan-hct 150-12.5 2 tabs qd, and toprol xl 67m qd. Lytes/cr today.   2) HLD: compliant with pravastatin 455mqd. FLP and hepatic panel today.   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 Hba1c and hepatic panel today.   4) Health maintenance exam: Reviewed age and gender appropriate health maintenance issues (prudent diet, regular exercise, health risks of tobacco and excessive alcohol, use of seatbelts, fire alarms in home, use of sunscreen).  Also reviewed age and gender appropriate health screening as well as vaccine recommendations. Vaccines: Tdap UTD.   Labs: fasting HP, Hba1c. Prostate ca screening: average risk patient= as per latest guidelines, start screening at 5074rs of age. Colon ca screening: average risk patient= as per latest guidelines, start screening at any time now--> pt chooses iFOB today."  INTERIM HX: I saw Carlos James on 07/01/2021 for lower extremity edema.  Started Lasix 20 mg a day. Sodium was 126, alk phos 170, and AST 58.  Otherwise c-Met normal.  UA was normal. Right upper quadrant ultrasound on 07/08/2021 showed stable echogenic liver likely related to diffuse hepatocellular disease such as fatty infiltration.  Update: He is feeling well, says his lower extremity swelling is improved but not completely resolved.  Again it is much better in the mornings and then by the end  of the day it has accumulated some.  Denies any pain in the legs.  He continues to have great energy level and be very active.  He has taken the Lasix 20 mg a day without side effect.  No home blood pressure monitoring data.  Additionally, 2 days ago he felt a snap/pop in his right bicep area when he was lifting something heavy.  It is mildly sore in the area but he feels no weakness. Does see a enlargement of the bicep when he flexes his right elbow.  ROS as above, plus--> no fevers, no CP, no SOB, no wheezing, no cough, no dizziness, no HAs, no rashes, no melena/hematochezia.  No polyuria or polydipsia.  No myalgias or arthralgias.  No focal weakness, paresthesias, or tremors.  No acute vision or hearing abnormalities.  No dysuria or unusual/new urinary urgency or frequency.  No n/v/d or abd pain.  No palpitations.     Past Medical History:  Diagnosis Date   Atypical chest pain since 2007   w/u has included a CT angio chest (neg), myoview (normal), EGD   Colon cancer screening 03/31/2021   03/31/21 iFOB neg. Rpt 1 yr.   Delayed gastric emptying    GERD (gastroesophageal reflux disease)    Hyperlipidemia    Intol of atorv, rosuva, lovalo, and zetia.  Good on pravastatin.   Hypertension    IFG (impaired fasting glucose) 2016   HbA1c 5.6%.  Repeat was 5.5% 01/2016.  Repeat 03/2017 5.8%.  01/2018  A1c 6.0%.. 03/2019 A1c 5.7%.   Insomnia    NASH (nonalcoholic steatohepatitis)    mild elev LFTs, viral hep serologies NEG.  Fatty liver on u/s 05/2020. Stable ultrasound 07/2021.   Obesity, Class II, BMI 35-39.9    Subclinical hypothyroidism 11/2020   11/2020->TSH very mildly elev, free T4 and T3 total NORMAL.    Past Surgical History:  Procedure Laterality Date   CARDIOVASCULAR STRESS TEST  07/2011   Normal, excellent exercise capacity   gastric emptying scan  2007   Delayed--reglan started    Outpatient Medications Prior to Visit  Medication Sig Dispense Refill   amLODipine (NORVASC) 10 MG  tablet Take 1 tablet (10 mg total) by mouth daily. 90 tablet 0   esomeprazole (NEXIUM) 40 MG capsule Take 1 capsule (40 mg total) by mouth 2 (two) times daily. 60 capsule 0   furosemide (LASIX) 20 MG tablet Take 1 tablet (20 mg total) by mouth daily. 30 tablet 0   irbesartan-hydrochlorothiazide (AVALIDE) 150-12.5 MG tablet Take 2 tablets by mouth daily. MUST KEEP SCHEDULED OFFICE VISIT 180 tablet 0   metoCLOPramide (REGLAN) 10 MG tablet Take 1 tablet (10 mg total) by mouth 2 (two) times daily. 180 tablet 3   metoprolol succinate (TOPROL-XL) 50 MG 24 hr tablet TAKE 1 TABLET BY MOUTH DAILY. TAKE WITH OR IMMEDIATELY FOLLOWING A MEAL. 90 tablet 0   Multiple Vitamin (MULTIVITAMIN) tablet Take 1 tablet by mouth daily.     pravastatin (PRAVACHOL) 40 MG tablet Take 1 tablet (40 mg total) by mouth daily. 90 tablet 3   No facility-administered medications prior to visit.    Allergies  Allergen Reactions   Ciprofloxacin     REACTION: Nausea    ROS As per HPI  PE: Vitals with BMI 07/10/2021 07/01/2021 12/02/2020  Height _0  5' 10.5" 5' 10.5"  Weight 262 lbs 3 oz 271 lbs 6 oz 252 lbs 13 oz  BMI 36.59 78.29 56.21  Systolic 308 657 846  Diastolic 84 80 73  Pulse 84 83 80     Physical Exam  Gen: Alert, well appearing.  Patient is oriented to person, place, time, and situation. AFFECT: pleasant, lucid thought and speech. CV: RRR, no m/r/g.   LUNGS: CTA bilat, nonlabored resps, good aeration in all lung fields. EXT: no clubbing or cyanosis.  2-3+ bilat LL pitting edema, most prominent in upper 1/2 of LLs rather than lower 1/2 or ankles/feet.  Pretibial surfaces with mild pinkish hue with freckling noted. Right arm: Mild Popeye deformity of the biceps.  Flexion at the elbow intact, normal strength with flexion of forearm and wrist as well as eversion and inversion.  No tenderness to palpation.  LABS:  Last CBC Lab Results  Component Value Date   WBC 6.7 12/02/2020   HGB 15.2 12/02/2020    HCT 45.3 12/02/2020   MCV 93.7 12/02/2020   MCH 31.0 12/24/2019   RDW 12.1 12/02/2020   PLT 230.0 96/29/5284   Last metabolic panel Lab Results  Component Value Date   GLUCOSE 115 (H) 07/01/2021   NA 126 (L) 07/01/2021   K 3.9 07/01/2021   CL 93 (L) 07/01/2021   CO2 23 07/01/2021   BUN 8 07/01/2021   CREATININE 0.66 07/01/2021   CALCIUM 8.7 07/01/2021   PROT 7.8 07/01/2021   ALBUMIN 4.1 07/01/2021   BILITOT 0.8 07/01/2021   ALKPHOS 170 (H) 07/01/2021   AST 58 (H) 07/01/2021   ALT 43 07/01/2021   Last lipids Lab Results  Component Value Date   CHOL 178 12/02/2020   HDL 53.60 12/02/2020   LDLCALC 111 (H) 12/02/2020   LDLDIRECT 176.7 07/12/2013   TRIG 66.0 12/02/2020   CHOLHDL 3 12/02/2020   Last hemoglobin A1c Lab Results  Component Value Date   HGBA1C 5.9 12/02/2020   Last thyroid functions Lab Results  Component Value Date   TSH 3.59 07/01/2021   T3TOTAL 106 07/01/2021   IMPRESSION AND PLAN:  #1 bilateral lower extremity edema.   This is pretty new, has had only minimal amount of this in the past.  His lower legs do have some skin changes that suggest that he has had at least some mild edema off and on chronically. Improved with Lasix 20 mg a day for the last 8 to 9 days. Checking electrolytes and creatinine today and will base any further recommendations on Lasix on his repeat sodium level today. Of note, his amlodipine could certainly cause lower extremity swelling but it has not done so in the past.  2.  Hyponatremia.  Suspect hypervolemic hyponatremia.  HCTZ could be contributing but he has been on this medication for a long time without hyponatremia. Urine negative for protein last visit.  Total protein level normal last visit. He has known history of fatty liver with some mild elevation of liver enzymes.  Most recently his AST was 58, ALT normal, T bili normal.  Alkaline phosphatase 170.  Repeat liver ultrasound showed stable steatosis changes.  #3  hypertension, stable. If sodium returned low again we will have to changes irbesartan-HCTZ to irbesartan alone. Continue amlodipine 10 mg a day. Electrolytes and creatinine today.  4.  Hyperlipidemia, tolerating pravastatin 40 mg a day well. He is not fasting today.  Will repeat lipid panel next visit.  5.  Prediabetes. Hemoglobin A1c and nonfasting glucose today.  #6 right biceps tendon long head rupture. Observation for now.  Orthopedic referral in future if this is giving him significant symptoms.  An After Visit Summary was printed and given to the patient.  FOLLOW UP: Return for To be determined based on lab results and future Lasix recommendations.. Next cpe 6 mo  Signed:  Crissie Sickles, MD           07/10/2021

## 2021-07-14 NOTE — Addendum Note (Signed)
Addended by: Octaviano Glow on: 07/14/2021 09:35 AM   Modules accepted: Orders

## 2021-07-18 LAB — HEMOGLOBIN A1C
Hgb A1c MFr Bld: 5.8 % of total Hgb — ABNORMAL HIGH (ref <5.7)
Mean Plasma Glucose: 120 mg/dL
eAG (mmol/L): 6.6 mmol/L

## 2021-07-18 LAB — T3: T3, Total: 118 ng/dL (ref 76–181)

## 2021-07-18 LAB — CBC WITH DIFFERENTIAL/PLATELET
Absolute Monocytes: 809 cells/uL (ref 200–950)
Basophils Absolute: 29 cells/uL (ref 0–200)
Basophils Relative: 0.6 %
Eosinophils Absolute: 59 cells/uL (ref 15–500)
Eosinophils Relative: 1.2 %
HCT: 45.2 % (ref 38.5–50.0)
Hemoglobin: 15.4 g/dL (ref 13.2–17.1)
Lymphs Abs: 877 cells/uL (ref 850–3900)
MCH: 31.3 pg (ref 27.0–33.0)
MCHC: 34.1 g/dL (ref 32.0–36.0)
MCV: 91.9 fL (ref 80.0–100.0)
MPV: 9.9 fL (ref 7.5–12.5)
Monocytes Relative: 16.5 %
Neutro Abs: 3126 cells/uL (ref 1500–7800)
Neutrophils Relative %: 63.8 %
Platelets: 238 10*3/uL (ref 140–400)
RBC: 4.92 10*6/uL (ref 4.20–5.80)
RDW: 12 % (ref 11.0–15.0)
Total Lymphocyte: 17.9 %
WBC: 4.9 10*3/uL (ref 3.8–10.8)

## 2021-07-18 LAB — COMPREHENSIVE METABOLIC PANEL
AG Ratio: 1.2 (calc) (ref 1.0–2.5)
ALT: 36 U/L (ref 9–46)
AST: 41 U/L — ABNORMAL HIGH (ref 10–40)
Albumin: 4.5 g/dL (ref 3.6–5.1)
Alkaline phosphatase (APISO): 174 U/L — ABNORMAL HIGH (ref 36–130)
BUN: 12 mg/dL (ref 7–25)
CO2: 24 mmol/L (ref 20–32)
Calcium: 9.7 mg/dL (ref 8.6–10.3)
Chloride: 97 mmol/L — ABNORMAL LOW (ref 98–110)
Creat: 0.8 mg/dL (ref 0.60–1.29)
Globulin: 3.9 g/dL (calc) — ABNORMAL HIGH (ref 1.9–3.7)
Glucose, Bld: 104 mg/dL — ABNORMAL HIGH (ref 65–99)
Potassium: 4.6 mmol/L (ref 3.5–5.3)
Sodium: 135 mmol/L (ref 135–146)
Total Bilirubin: 0.8 mg/dL (ref 0.2–1.2)
Total Protein: 8.4 g/dL — ABNORMAL HIGH (ref 6.1–8.1)

## 2021-07-18 LAB — PROTEIN ELECTROPHORESIS, SERUM
Albumin ELP: 4.6 g/dL (ref 3.8–4.8)
Alpha 1: 0.4 g/dL — ABNORMAL HIGH (ref 0.2–0.3)
Alpha 2: 0.8 g/dL (ref 0.5–0.9)
Beta 2: 0.6 g/dL — ABNORMAL HIGH (ref 0.2–0.5)
Beta Globulin: 0.6 g/dL (ref 0.4–0.6)
Gamma Globulin: 1.7 g/dL (ref 0.8–1.7)
Total Protein: 8.7 g/dL — ABNORMAL HIGH (ref 6.1–8.1)

## 2021-07-18 LAB — T4, FREE: Free T4: 1.2 ng/dL (ref 0.8–1.8)

## 2021-07-18 LAB — TSH: TSH: 5.65 mIU/L — ABNORMAL HIGH (ref 0.40–4.50)

## 2021-07-23 ENCOUNTER — Other Ambulatory Visit: Payer: Self-pay | Admitting: Family Medicine

## 2021-07-23 NOTE — Telephone Encounter (Signed)
RF request for LASIX LOV: 07/10/21 Next ov:  N/A Last written: 07/01/21(30,0)  Please fill, if appropriate. Med pending

## 2021-08-25 ENCOUNTER — Other Ambulatory Visit: Payer: Self-pay | Admitting: Family Medicine

## 2021-09-23 ENCOUNTER — Other Ambulatory Visit: Payer: Self-pay | Admitting: Family Medicine

## 2021-09-25 ENCOUNTER — Other Ambulatory Visit: Payer: Self-pay | Admitting: Family Medicine

## 2021-09-26 ENCOUNTER — Other Ambulatory Visit: Payer: Self-pay | Admitting: Family Medicine

## 2021-09-26 DIAGNOSIS — I10 Essential (primary) hypertension: Secondary | ICD-10-CM

## 2021-11-03 ENCOUNTER — Encounter: Payer: Self-pay | Admitting: Family Medicine

## 2021-11-03 ENCOUNTER — Ambulatory Visit (INDEPENDENT_AMBULATORY_CARE_PROVIDER_SITE_OTHER): Payer: No Typology Code available for payment source | Admitting: Family Medicine

## 2021-11-03 ENCOUNTER — Telehealth: Payer: Self-pay

## 2021-11-03 VITALS — BP 91/54 | HR 101 | Temp 101.0°F | Ht 71.0 in | Wt 243.0 lb

## 2021-11-03 DIAGNOSIS — K429 Umbilical hernia without obstruction or gangrene: Secondary | ICD-10-CM | POA: Diagnosis not present

## 2021-11-03 DIAGNOSIS — R197 Diarrhea, unspecified: Secondary | ICD-10-CM

## 2021-11-03 DIAGNOSIS — R52 Pain, unspecified: Secondary | ICD-10-CM

## 2021-11-03 MED ORDER — DICYCLOMINE HCL 10 MG PO CAPS
10.0000 mg | ORAL_CAPSULE | Freq: Three times a day (TID) | ORAL | 0 refills | Status: DC
Start: 2021-11-03 — End: 2022-02-10

## 2021-11-03 NOTE — Progress Notes (Signed)
? ? ? ? ? ?[place] , Jul 10, 1975, 46 y.o., male ?MRN: [id number] ?Patient Care Team  ?  Relationship Specialty Notifications Start End  ?Tammi Sou, MD PCP - General Family Medicine  02/28/14   ? Comment: Dr. Arnoldo Morale transfer  ? ? ?Chief Complaint  ?Patient presents with  ? Diarrhea  ?  Pt c/o diarrhea, body aches x 5 days; pt reports stools to be watery going 3-10 times day  ? ?  ?Subjective: Pt presents for an OV with complaints of diarrhea 5 days duration. He endorses 3-10x a day of watery stools. He thought he felt better on Saturday, but symptoms rebounded next day. He has been taking pepto, imodium, and drinking gatorade regular. He has not had much of an appetite, but denies nausea or vomit. He has been taking all schedule medications. He was seen at the [facility] Saturday and test negative for covid and flu.   ?He denies any recent antibiotic use, contact with other sick individuals or travel. ?He also reports he has an umbilical hernia that he thinks is becoming more large since his acute illness of diarrhea.  He denies any pain associated with the umbilical hernia. ? ? ?  07/01/2021  ?  4:11 PM 04/09/2020  ?  8:33 AM 11/24/2018  ?  1:41 PM 03/14/2018  ?  8:39 AM 03/17/2017  ?  8:04 AM  ?Depression screen PHQ 2/9  ?Decreased Interest 0 0 0 0 0  ?Down, Depressed, Hopeless 0 0 0 0 0  ?PHQ - 2 Score 0 0 0 0 0  ? ? ?Allergies  ?Allergen Reactions  ? Ciprofloxacin   ?  REACTION: Nausea  ? ?Social History  ? ?Social History Narrative  ? Married, 2 chldren.  ? 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  ? Orig from [place].  ? He dips.  No smoking.  Alcohol intake: 2-3 beers a day on his days off.  ?   ?   ?   ? ?Past Medical History:  ?Diagnosis Date  ? Atypical chest pain since 2007  ? w/u has included a CT angio chest (neg), myoview (normal), EGD  ? Colon cancer screening 03/31/2021  ? 03/31/21 iFOB neg. Rpt 1 yr.  ? Delayed gastric emptying   ? GERD (gastroesophageal reflux disease)   ? Hyperlipidemia   ? Intol  of atorv, rosuva, lovalo, and zetia.  Good on pravastatin.  ? Hypertension   ? IFG (impaired fasting glucose) 2016  ? HbA1c 5.6%.  Repeat was 5.5% 01/2016.  Repeat 03/2017 5.8%.  01/2018 A1c 6.0%.. 03/2019 A1c 5.7%.  ? Insomnia   ? NASH (nonalcoholic steatohepatitis)   ? mild elev LFTs, viral hep serologies NEG.  Fatty liver on u/s 05/2020. Stable ultrasound 07/2021.  ? Obesity, Class II, BMI 35-39.9   ? Subclinical hypothyroidism 11/2020  ? 11/2020->TSH very mildly elev, free T4 and T3 total NORMAL.  ? ?Past Surgical History:  ?Procedure Laterality Date  ? CARDIOVASCULAR STRESS TEST  07/2011  ? Normal, excellent exercise capacity  ? gastric emptying scan  2007  ? Delayed--reglan started  ? ?Family History  ?Problem Relation Age of Onset  ? Coronary artery disease Other   ? Arthritis Mother   ? Heart disease Mother   ? Hyperlipidemia Mother   ? Arthritis Father   ? Heart disease Father   ? Hyperlipidemia Father   ? ?Allergies as of 11/03/2021   ? ?   Reactions  ? Ciprofloxacin   ? REACTION: Nausea  ? ?  ? ?  ?  Medication List  ?  ? ?  ? Accurate as of Nov 03, 2021  1:51 PM. If you have any questions, ask your nurse or doctor.  ?  ?  ? ?  ? ?amLODipine 10 MG tablet ?Commonly known as: NORVASC ?TAKE 1 TABLET BY MOUTH EVERY DAY ?  ?esomeprazole 40 MG capsule ?Commonly known as: [place] ?Take 1 capsule (40 mg total) by mouth 2 (two) times daily. ?  ?furosemide 20 MG tablet ?Commonly known as: LASIX ?TAKE 1 TABLET BY MOUTH EVERY DAY AS NEEDED FOR LOWER LEG SWELLING ?  ?irbesartan-hydrochlorothiazide 150-12.5 MG tablet ?Commonly known as: AVALIDE ?Take 2 tablets by mouth daily. ?  ?metoCLOPramide 10 MG tablet ?Commonly known as: REGLAN ?Take 1 tablet (10 mg total) by mouth 2 (two) times daily. ?  ?metoprolol succinate 50 MG 24 hr tablet ?Commonly known as: TOPROL-XL ?TAKE 1 TABLET BY MOUTH DAILY. TAKE WITH OR IMMEDIATELY FOLLOWING A MEAL. ?  ?multivitamin tablet ?Take 1 tablet by mouth daily. ?  ?pravastatin 40 MG tablet ?Commonly  known as: PRAVACHOL ?Take 1 tablet (40 mg total) by mouth daily. ?  ? ?  ? ? ?All past medical history, surgical history, allergies, family history, immunizations andmedications were updated in the EMR today and reviewed under the history and medication portions of their EMR.    ? ?Review of Systems  ?Constitutional:  Positive for chills, fever and weight loss.  ?HENT:  Negative for sinus pain and sore throat.   ?Respiratory:  Negative for cough and shortness of breath.   ?Cardiovascular:  Negative for chest pain and leg swelling.  ?Gastrointestinal:  Positive for diarrhea. Negative for abdominal pain, blood in stool, heartburn, nausea and vomiting.  ?Genitourinary:  Negative for dysuria.  ?Skin:  Negative for rash.  ?Neurological:  Positive for dizziness. Negative for weakness and headaches.  ?Negative, with the exception of above mentioned in HPI ? ? ?Objective:  ?BP (!) 91/54   Pulse (!) 101   Temp (!) 101 ?F (38.3 ?C) (Oral)   Ht 5' 11"  (1.803 m)   Wt 243 lb (110.2 kg)   SpO2 97%   BMI 33.89 kg/m?  ?Body mass index is 33.89 kg/m?Marland Kitchen ?Physical Exam ?Vitals and nursing note reviewed.  ?Constitutional:   ?   General: He is not in acute distress. ?   Appearance: Normal appearance. He is not ill-appearing, toxic-appearing or diaphoretic.  ?HENT:  ?   Head: Normocephalic and atraumatic.  ?   Right Ear: Tympanic membrane normal.  ?   Left Ear: Tympanic membrane normal.  ?   Nose: No congestion or rhinorrhea.  ?   Mouth/Throat:  ?   Pharynx: No oropharyngeal exudate or posterior oropharyngeal erythema.  ?Eyes:  ?   General: No scleral icterus.    ?   Right eye: No discharge.     ?   Left eye: No discharge.  ?   Extraocular Movements: Extraocular movements intact.  ?   Pupils: Pupils are equal, round, and reactive to light.  ?Cardiovascular:  ?   Rate and Rhythm: Regular rhythm. Tachycardia present.  ?   Heart sounds: No murmur heard. ?Pulmonary:  ?   Effort: Pulmonary effort is normal. No respiratory distress.  ?    Breath sounds: Normal breath sounds. No wheezing.  ?Abdominal:  ?   General: Bowel sounds are normal.  ?   Palpations: Abdomen is soft.  ?   Hernia: A hernia (Reducible umbilical hernia present.  No tenderness to palpation.  No skin changes.) is present.  ?Musculoskeletal:  ?   Cervical back: Neck supple.  ?   Right lower leg: No edema.  ?   Left lower leg: No edema.  ?Lymphadenopathy:  ?   Cervical: No cervical adenopathy.  ?Skin: ?   General: Skin is warm and dry.  ?   Coloration: Skin is not jaundiced or pale.  ?   Findings: No rash.  ?Neurological:  ?   Mental Status: He is alert and oriented to person, place, and time. Mental status is at baseline.  ?Psychiatric:     ?   Mood and Affect: Mood normal.     ?   Behavior: Behavior normal.     ?   Thought Content: Thought content normal.     ?   Judgment: Judgment normal.  ? ? ? ?No results found. ?No results found. ?No results found for this or any previous visit (from the past 24 hour(s)). ? ?Assessment/Plan: ?Pesach Frisch is a 46 y.o. male present for OV for  ?Suspect viral gastroenteritis as cause of his symptoms.  He is hypotensive and tachycardic today likely from fever and dehydration from diarrhea and diuretic use. ?He was seeing some improvement initially, however taking Imodium and Pepto, which then caused a rebound in his symptoms. ?Discontinue Imodium and Pepto ?Avoid all dairy and high sugar content drinks until diarrhea is resolved ?- stop Lasix until diarrhea stops-then restart per label instructions ?- Stop irb/hctz until diarrhea stops-then restart her label instructions ?- bentyl prescribed.  ?Hydrate with water and Gatorade 0 or G2. ?Bland/soft diet for the next 48 hours then can advance as tolerated ?Follow-up with PCP in 1 week if not seeing improvement. ? ?Umbilical hernia: ?Discussed emergent precautions with his umbilical hernia.  There does not appear to be obstruction and it is reducible easily without any pain today.  Encouraged him  to keep mild pressure over this area when coughing and vagal maneuvers such as with bowel movements.  He will monitor for any emergent precautions, pain, skin changes etc.  ?This is the first time I evaluated

## 2021-11-03 NOTE — Patient Instructions (Addendum)
- stop diuretic until diarrhea stops.  ?- Stop irb/hctz until diarrhea stops ?- bentyl prescribed every 6 hours to slow down the gut.  ? ?Avoid all dairy and sugar.  ? ?Hydrate water and Gatorade zero at least 120 ounces next couple days.  ? ?Avoid spicy- bland and clear liquid diet.  ? ? ?Viral Gastroenteritis, Adult ? ?Viral gastroenteritis is also known as the stomach flu. This condition may affect your stomach, your small intestine, and your large intestine. It can cause sudden watery poop (diarrhea), fever, and vomiting. This condition is caused by certain germs (viruses). These germs can be passed from person to person very easily (are contagious). ?Having watery poop and vomiting can make you feel weak and cause you to not have enough water in your body (get dehydrated). This can make you tired and thirsty, make you have a dry mouth, and make it so you pee (urinate) less often. It is important to replace the fluids that you lose from having watery poop and vomiting. ?What are the causes? ?You can get sick by catching germs from other people. ?You can also get sick by: ?Eating food, drinking water, or touching a surface that has the germs on it (is contaminated). ?Sharing utensils or other personal items with a person who is sick. ?What increases the risk? ?Having a weak body defense system (immune system). ?Living with one or more children who are younger than 2 years. ?Living in a nursing home. ?Going on cruise ships. ?What are the signs or symptoms? ?Symptoms of this condition start suddenly. Symptoms may last for a few days or for as long as a week. ?Common symptoms include: ?Watery poop. ?Vomiting. ?Other symptoms include: ?Fever. ?Headache. ?Feeling tired (fatigue). ?Pain in the belly (abdomen). ?Chills. ?Feeling weak. ?Feeling like you may vomit (nauseous). ?Muscle aches. ?Not feeling hungry. ?How is this treated? ?This condition typically goes away on its own. The focus of treatment is to replace the  fluids that you lose. This condition may be treated with: ?An ORS (oral rehydration solution). This is a drink that helps you replace fluids and minerals your body lost. It is sold at pharmacies and stores. ?Medicines to help with your symptoms. ?Probiotic supplements to reduce symptoms of watery poop. ?Fluids given through an IV tube, if needed. ?Older adults and people with other diseases or a weak body defense system are at higher risk for not having enough water in the body. ?Follow these instructions at home: ?Eating and drinking ? ?Take an ORS as told by your doctor. ?Drink clear fluids in small amounts as you are able. Clear fluids include: ?Water. ?Ice chips. ?Fruit juice that has water added to it (is diluted). ?Low-calorie sports drinks. ?Drink enough fluid to keep your pee (urine) pale yellow. ?Eat small amounts of healthy foods every 3-4 hours as you are able. This may include whole grains, fruits, vegetables, lean meats, and yogurt. ?Avoid fluids that have a lot of sugar or caffeine in them. This includes energy drinks, sports drinks, and soda. ?Avoid spicy or fatty foods. ?Avoid alcohol. ?General instructions ? ?Wash your hands often. This is very important after you have watery poop or you vomit. If you cannot use soap and water, use hand sanitizer. ?Make sure that all people in your home wash their hands well and often. ?Take over-the-counter and prescription medicines only as told by your doctor. ?Rest at home while you get better. ?Watch your condition for any changes. ?Take a warm bath to help with  any burning or pain from having watery poop. ?Keep all follow-up visits. ?Contact a doctor if: ?You cannot keep fluids down. ?Your symptoms get worse. ?You have new symptoms. ?You feel light-headed or dizzy. ?You have muscle cramps. ?Get help right away if: ?You have chest pain. ?You have trouble breathing, or you are breathing very fast. ?You have a fast heartbeat. ?You feel very weak or you  faint. ?You have a very bad headache, a stiff neck, or both. ?You have a rash. ?You have very bad pain, cramping, or bloating in your belly. ?Your skin feels cold and clammy. ?You feel mixed up (confused). ?You have pain when you pee. ?You have signs of not having enough water in the body, such as: ?Dark pee, hardly any pee, or no pee. ?Cracked lips. ?Dry mouth. ?Sunken eyes. ?Feeling very sleepy. ?Feeling weak. ?You have signs of bleeding, such as: ?You see blood in your vomit. ?Your vomit looks like coffee grounds. ?You have bloody or black poop or poop that looks like tar. ?These symptoms may be an emergency. Get help right away. Call 911. ?Do not wait to see if the symptoms will go away. ?Do not drive yourself to the hospital. ?Summary ?Viral gastroenteritis is also known as the stomach flu. ?This condition can cause sudden watery poop (diarrhea), fever, and vomiting. ?These germs can be passed from person to person very easily. ?Take an ORS (oral rehydration solution) as told by your doctor. This is a drink that is sold at pharmacies and stores. ?Wash your hands often, especially after having watery poop or vomiting. If you cannot use soap and water, use hand sanitizer. ?This information is not intended to replace advice given to you by your health care provider. Make sure you discuss any questions you have with your health care provider. ?Document Revised: 04/20/2021 Document Reviewed: 04/20/2021 ?Elsevier Patient Education ? [place]. ? ? ? ? ? ? ? ?Umbilical Hernia, Adult ? ?A hernia is a bulge of tissue that pushes through an opening between muscles. An umbilical hernia happens in the abdomen, near the belly button (umbilicus). The hernia may contain tissues from the small intestine, large intestine, or fatty tissue covering the intestines. Umbilical hernias in adults tend to get worse over time, and they require surgical treatment. ?There are different types of umbilical hernias,  including: ?Indirect hernia. This type is located just above or below the umbilicus. It is the most common type of umbilical hernia in adults. ?Direct hernia. This type forms through an opening formed by the umbilicus. ?Reducible hernia. This type of hernia comes and goes. It may be visible only when you strain, lift something heavy, or cough. This type of hernia can be pushed back into the abdomen (reduced). ?Incarcerated hernia. This type traps abdominal tissue inside the hernia. This type of hernia cannot be reduced. ?Strangulated hernia. This type of hernia cuts off blood flow to the tissues inside the hernia. The tissues can start to die if this happens. This type of hernia requires emergency treatment. ?What are the causes? ?An umbilical hernia happens when tissue inside the abdomen presses on a weak area of the abdominal muscles. ?What increases the risk? ?You may have a greater risk of this condition if you: ?Are obese. ?Have had several pregnancies. ?Have a buildup of fluid inside your abdomen. ?Have had surgery that weakens the abdominal muscles. ?What are the signs or symptoms? ?The main symptom of this condition is a painless bulge at or near the belly button. ?  A reducible hernia may be visible only when you strain, lift something heavy, or cough. Other symptoms may include: ?Dull pain. ?A feeling of pressure. ?Symptoms of a strangulated hernia may include: ?Pain that gets increasingly worse. ?Nausea and vomiting. ?Pain when pressing on the hernia. ?Skin over the hernia becoming red or purple. ?Constipation. ?Blood in the stool. ?How is this diagnosed? ?This condition may be diagnosed based on: ?A physical exam. You may be asked to cough or strain while standing. These actions increase the pressure inside your abdomen and can force the hernia through the opening in your muscles. Your health care provider may try to reduce the hernia by pressing on it. ?Your symptoms and medical history. ?How is this  treated? ?Surgery is the only treatment for an umbilical hernia. Surgery for a strangulated hernia is done as soon as possible. If you have a small hernia that is not incarcerated, you may need to lose weight before having surgery.

## 2021-11-03 NOTE — Telephone Encounter (Signed)
[facility] - [facility] ?TELEPHONE ADVICE RECORD ?[facility]? ?Patient Name: ?Carlos James ?Gender: Male ?DOB: Dec 30, 1975 ?Age: 46 Y 5 M 1 D ?Return Phone Number: ?[phone] ?(Primary) ?Address: ?City/State/Zip: [place] [address] ?[facility] - Client ?Client Site [facility] Day ?Provider Crissie Sickles - MD ?Contact Type Call ?Who Is Calling Patient / Member / Family / Caregiver ?Call Type Triage / Clinical ?Relationship To Patient Self ?Return Phone Number [phone] (Primary) ?Chief Complaint SEVERE ABDOMINAL PAIN - Severe pain in abdomen ?Reason for Call Symptomatic / Request for Health Information ?Initial Comment Caller states he would like to be seen today. He has had diarrhea, severe abdominal pain and cold chills since Friday. He has been unable to eat. ?Translation No ?Nurse Assessment ?Nurse: Radford Pax, RN, Eugene Garnet Date/Time Eilene Ghazi Time): 11/03/2021 8:07:53 AM ?Confirm and document reason for call. If ?symptomatic, describe symptoms. ?---Has had diarrhea and abdominal pain since Friday. ?Chills, temp of 99. Has been taking pepto and ?immodium. ?Does the patient have any new or worsening ?symptoms? ---Yes ?Will a triage be completed? ---Yes ?Related visit to physician within the last 2 weeks? ---Yes ?Does the PT have any chronic conditions? (i.e. ?diabetes, asthma, this includes High risk factors for ?pregnancy, etc.) ?---Yes ?List chronic conditions. ---HTN-med controlled, high cholesterol ?Is this a behavioral health or substance abuse call? ---No ?Guidelines ?Guideline Title Affirmed Question Affirmed Notes Nurse Date/Time (Eastern ?Time) ?Diarrhea [1] SEVERE diarrhea ?(e.g., 7 or more ?times / day more than ?normal) AND [2] ?present > 24 hours (1 ?day) ?Turner, RN, Eugene Garnet 11/03/2021 8:09:17 AM ?PLEASE NOTE: All timestamps contained within this report are represented as [place] Standard Time. ?CONFIDENTIALTY NOTICE: This fax  transmission is intended only for the addressee. It contains information that is legally privileged, confidential or ?otherwise protected from use or disclosure. If you are not the intended recipient, you are strictly prohibited from reviewing, disclosing, copying using ?or disseminating any of this information or taking any action in reliance on or regarding this information. If you have received this fax in error, please ?notify us immediately by telephone so that we can arrange for its return to [place]. Phone: [phone], Toll-Free: [phone], Fax: [phone] ?Page: 2 of 2 ?Call Id: [id number] ?Disp. Time (Eastern ?Time) Disposition Final User ?11/03/2021 8:05:33 AM Send to Urgent Queue Zane Herald ?11/03/2021 8:18:34 AM See PCP within 24 Hours Yes Turner, RN, Eugene Garnet ?Caller Disagree/Comply Comply ?Caller Understands Yes ?PreDisposition Call Doctor ?Care Advice Given Per Guideline ?SEE PCP WITHIN 24 HOURS: * IF OFFICE WILL BE OPEN: You need to be examined within the next 24 hours. Call your ?doctor (or NP/PA) when the office opens and make an appointment. FLUID THERAPY DURING SEVERE DIARRHEA: * Drink ?more fluids, at least 8 to 10 cups daily. One cup equals 8 oz (240 ml). * WATER: For mild to moderate diarrhea, water is often ?the best liquid to drink. You should also eat some salty foods (e.g., potato chips, pretzels, saltine crackers). This is important to ?make sure you are getting enough salt, sugars, and fluids to meet your body's needs. FOOD AND NUTRITION DURING SEVERE ?DIARRHEA: * Drinking enough liquids is more important than eating when one has severe diarrhea. * Begin with boiled starches / ?cereals (e.g., potatoes, rice, noodles, wheat, oats) with a small amount of salt. * You can also eat bananas, yogurt, crackers, soup. ?DIARRHEA MEDICINE - LOPERAMIDE (IMODIUM AD): * This medicine helps decrease diarrhea. It is available over-thecounter (  OTC) in a drugstore. * Adult dosage: 4 mg (2 capsules) is the  recommended first dose. You may take an additional 2 mg ?(1 capsule) after each loose stool. * Do not use for more than 2 days. DIARRHEA MEDICINE - BISMUTH SUBSALICYLATE ?(E.G., KAOPECTATE, PEPTO-BISMOL): * This medicine can help reduce diarrhea, vomiting, and abdominal cramping. It is ?available over-the-counter (OTC) in a drugstore. * Adult dosage: Take two tablets or two tablespoons by mouth every hour (if ?diarrhea continues) to a maximum of 8 doses in a 24 hour period. * Do not use for more than 2 days. CALL BACK IF: * Signs of ?dehydration occur (e.g., no urine over 12 hours, very dry mouth, lightheaded, etc.) * Constant or severe abdomen pain * Bloody ?stools * You become worse CARE ADVICE given per Diarrhea (Adult) guideline. ?Comments ?User: Susie Cassette, RN Date/Time Eilene Ghazi Time): 11/03/2021 8:19:22 AM ?Transferred to [facility] for appt. ?Referrals ?REFERRED TO PCP OFFICE ?

## 2021-11-11 ENCOUNTER — Other Ambulatory Visit: Payer: Self-pay | Admitting: Family Medicine

## 2021-11-19 ENCOUNTER — Other Ambulatory Visit: Payer: Self-pay | Admitting: Family Medicine

## 2021-12-15 ENCOUNTER — Other Ambulatory Visit: Payer: Self-pay | Admitting: Family Medicine

## 2021-12-21 ENCOUNTER — Other Ambulatory Visit: Payer: Self-pay | Admitting: Family Medicine

## 2021-12-29 ENCOUNTER — Other Ambulatory Visit: Payer: Self-pay

## 2021-12-29 MED ORDER — IRBESARTAN-HYDROCHLOROTHIAZIDE 150-12.5 MG PO TABS: 2.0000 | ORAL_TABLET | Freq: Every day | ORAL | 0 refills | Status: DC

## 2022-01-06 ENCOUNTER — Other Ambulatory Visit: Payer: Self-pay | Admitting: Family Medicine

## 2022-01-09 ENCOUNTER — Other Ambulatory Visit: Payer: Self-pay | Admitting: Family Medicine

## 2022-01-27 ENCOUNTER — Other Ambulatory Visit: Payer: Self-pay | Admitting: Family Medicine

## 2022-01-27 ENCOUNTER — Other Ambulatory Visit: Payer: Self-pay

## 2022-01-27 MED ORDER — METOPROLOL SUCCINATE ER 50 MG PO TB24
ORAL_TABLET | ORAL | 0 refills | Status: DC
Start: 2022-01-27 — End: 2022-04-26

## 2022-02-01 ENCOUNTER — Ambulatory Visit: Payer: Self-pay | Admitting: Surgery

## 2022-02-04 ENCOUNTER — Other Ambulatory Visit: Payer: Self-pay | Admitting: Family Medicine

## 2022-02-04 DIAGNOSIS — I10 Essential (primary) hypertension: Secondary | ICD-10-CM

## 2022-02-10 ENCOUNTER — Ambulatory Visit: Payer: No Typology Code available for payment source | Admitting: Family Medicine

## 2022-02-10 NOTE — Progress Notes (Deleted)
Office Note 02/10/2022  CC: No chief complaint on file.   HPI:  Patient is a 46 y.o. male who is here for 7 month follow-up hypertension, hyperlipidemia, and bilateral lower extremity edema. A/P as of last visit: "#1 bilateral lower extremity edema.   This is pretty new, has had only minimal amount of this in the past.  His lower legs do have some skin changes that suggest that he has had at least some mild edema off and on chronically. Improved with Lasix 20 mg a day for the last 8 to 9 days. Checking electrolytes and creatinine today and will base any further recommendations on Lasix on his repeat sodium level today. Of note, his amlodipine could certainly cause lower extremity swelling but it has not done so in the past.  2.  Hyponatremia.  Suspect hypervolemic hyponatremia.  HCTZ could be contributing but he has been on this medication for a long time without hyponatremia. Urine negative for protein last visit.  Total protein level normal last visit. He has known history of fatty liver with some mild elevation of liver enzymes.  Most recently his AST was 58, ALT normal, T bili normal.  Alkaline phosphatase 170.  Repeat liver ultrasound showed stable steatosis changes.   #3 hypertension, stable. If sodium returned low again we will have to changes irbesartan-HCTZ to irbesartan alone. Continue amlodipine 10 mg a day. Electrolytes and creatinine today.  4.  Hyperlipidemia, tolerating pravastatin 40 mg a day well. He is not fasting today.  Will repeat lipid panel next visit.  5.  Prediabetes. Hemoglobin A1c and nonfasting glucose today.   #6 right biceps tendon long head rupture. Observation for now.  Orthopedic referral in future if this is giving him significant symptoms."  INTERIM HX: ***    Past Medical History:  Diagnosis Date   Atypical chest pain since 2007   w/u has included a CT angio chest (neg), myoview (normal), EGD   Colon cancer screening 03/31/2021    03/31/21 iFOB neg. Rpt 1 yr.   Delayed gastric emptying    GERD (gastroesophageal reflux disease)    Hyperlipidemia    Intol of atorv, rosuva, lovalo, and zetia.  Good on pravastatin.   Hypertension    IFG (impaired fasting glucose) 2016   HbA1c 5.6%.  Repeat was 5.5% 01/2016.  Repeat 03/2017 5.8%.  01/2018 A1c 6.0%.. 03/2019 A1c 5.7%.   Insomnia    NASH (nonalcoholic steatohepatitis)    mild elev LFTs, viral hep serologies NEG.  Fatty liver on u/s 05/2020. Stable ultrasound 07/2021.   Obesity, Class II, BMI 35-39.9    Subclinical hypothyroidism 11/2020   11/2020->TSH very mildly elev, free T4 and T3 total NORMAL.    Past Surgical History:  Procedure Laterality Date   CARDIOVASCULAR STRESS TEST  07/2011   Normal, excellent exercise capacity   gastric emptying scan  2007   Delayed--reglan started    Family History  Problem Relation Age of Onset   Coronary artery disease Other    Arthritis Mother    Heart disease Mother    Hyperlipidemia Mother    Arthritis Father    Heart disease Father    Hyperlipidemia Father     Social History   Socioeconomic History   Marital status: Married    Spouse name: Not on file   Number of children: Not on file   Years of education: Not on file   Highest education level: 12th grade  Occupational History   Not on file  Tobacco  Use   Smoking status: Former   Smokeless tobacco: Current   Tobacco comments:    uses 1 can a day, was a periodic smoker, but not now.  Substance and Sexual Activity   Alcohol use: Yes   Drug use: No   Sexual activity: Yes  Other Topics Concern   Not on file  Social History Narrative   Married, 2 chldren.   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   Orig from [place].   He dips.  No smoking.  Alcohol intake: 2-3 beers a day on his days off.            Social Determinants of Health   Financial Resource Strain: Low Risk  (02/10/2022)   Overall Financial Resource Strain (CARDIA)    Difficulty of Paying Living  Expenses: Not hard at all  Food Insecurity: No Food Insecurity (02/10/2022)   Hunger Vital Sign    Worried About Running Out of Food in the Last Year: Never true    Ran Out of Food in the Last Year: Never true  Transportation Needs: No Transportation Needs (02/10/2022)   PRAPARE - Hydrologist (Medical): No    Lack of Transportation (Non-Medical): No  Physical Activity: Unknown (02/10/2022)   Exercise Vital Sign    Days of Exercise per Week: Patient refused    Minutes of Exercise per Session: Not on file  Stress: No Stress Concern Present (02/10/2022)   [place]    Feeling of Stress : Not at all  Social Connections: Unknown (02/10/2022)   Social Connection and Isolation Panel [NHANES]    Frequency of Communication with Friends and Family: More than three times a week    Frequency of Social Gatherings with Friends and Family: Once a week    Attends Religious Services: Patient refused    Marine scientist or Organizations: No    Attends Music therapist: Not on file    Marital Status: Married  Human resources officer Violence: Not on file    Outpatient Medications Prior to Visit  Medication Sig Dispense Refill   amLODipine (NORVASC) 10 MG tablet TAKE 1 TABLET BY MOUTH EVERY DAY 90 tablet 0   dicyclomine (BENTYL) 10 MG capsule Take 1 capsule (10 mg total) by mouth 4 (four) times daily -  before meals and at bedtime. 60 capsule 0   esomeprazole (NEXIUM) 40 MG capsule Take 1 capsule (40 mg total) by mouth 2 (two) times daily. 60 capsule 0   furosemide (LASIX) 20 MG tablet TAKE 1 TABLET BY MOUTH EVERY DAY AS NEEDED FOR LOWER LEG SWELLING 90 tablet 0   irbesartan-hydrochlorothiazide (AVALIDE) 150-12.5 MG tablet Take 2 tablets by mouth daily. 180 tablet 0   metoCLOPramide (REGLAN) 10 MG tablet Take 1 tablet (10 mg total) by mouth 2 (two) times daily. 180 tablet 3   metoprolol succinate  (TOPROL-XL) 50 MG 24 hr tablet TAKE 1 TABLET BY MOUTH EVERY DAY WITH OR IMMEDIATELY FOLLOWING A MEAL 90 tablet 0   Multiple Vitamin (MULTIVITAMIN) tablet Take 1 tablet by mouth daily.     pravastatin (PRAVACHOL) 40 MG tablet TAKE 1 TABLET BY MOUTH EVERY DAY 90 tablet 3   No facility-administered medications prior to visit.    Allergies  Allergen Reactions   Ciprofloxacin     REACTION: Nausea    ROS *** PE;    11/03/2021    1:46 PM 07/10/2021    8:32 AM 07/01/2021  4:10 PM  Vitals with BMI  Height 5' 11"  5' 11"  5' 10.5"  Weight 243 lbs 262 lbs 3 oz 271 lbs 6 oz  BMI 33.91 58.85 02.77  Systolic 91 412 878  Diastolic 54 84 80  Pulse 676 84 83     *** Pertinent labs:  Lab Results  Component Value Date   TSH 5.65 (H) 07/10/2021   Lab Results  Component Value Date   WBC 4.9 07/10/2021   HGB 15.4 07/10/2021   HCT 45.2 07/10/2021   MCV 91.9 07/10/2021   PLT 238 07/10/2021   Lab Results  Component Value Date   CREATININE 0.80 07/10/2021   BUN 12 07/10/2021   NA 135 07/10/2021   K 4.6 07/10/2021   CL 97 (L) 07/10/2021   CO2 24 07/10/2021   Lab Results  Component Value Date   ALT 36 07/10/2021   AST 41 (H) 07/10/2021   ALKPHOS 170 (H) 07/01/2021   BILITOT 0.8 07/10/2021   Lab Results  Component Value Date   CHOL 178 12/02/2020   Lab Results  Component Value Date   HDL 53.60 12/02/2020   Lab Results  Component Value Date   LDLCALC 111 (H) 12/02/2020   Lab Results  Component Value Date   TRIG 66.0 12/02/2020   Lab Results  Component Value Date   CHOLHDL 3 12/02/2020   Lab Results  Component Value Date   HGBA1C 5.8 (H) 07/10/2021   ASSESSMENT AND PLAN:   No problem-specific Assessment & Plan notes found for this encounter.   An After Visit Summary was printed and given to the patient.  FOLLOW UP:  No follow-ups on file.  @esig @

## 2022-02-11 ENCOUNTER — Ambulatory Visit (INDEPENDENT_AMBULATORY_CARE_PROVIDER_SITE_OTHER): Payer: No Typology Code available for payment source | Admitting: Family Medicine

## 2022-02-11 ENCOUNTER — Encounter: Payer: Self-pay | Admitting: Family Medicine

## 2022-02-11 VITALS — BP 116/74 | HR 72 | Temp 98.6°F | Ht 71.0 in | Wt 223.4 lb

## 2022-02-11 DIAGNOSIS — Z0000 Encounter for general adult medical examination without abnormal findings: Secondary | ICD-10-CM | POA: Diagnosis not present

## 2022-02-11 DIAGNOSIS — E038 Other specified hypothyroidism: Secondary | ICD-10-CM

## 2022-02-11 DIAGNOSIS — E7800 Pure hypercholesterolemia, unspecified: Secondary | ICD-10-CM | POA: Diagnosis not present

## 2022-02-11 DIAGNOSIS — Z1211 Encounter for screening for malignant neoplasm of colon: Secondary | ICD-10-CM | POA: Diagnosis not present

## 2022-02-11 DIAGNOSIS — R748 Abnormal levels of other serum enzymes: Secondary | ICD-10-CM | POA: Diagnosis not present

## 2022-02-11 DIAGNOSIS — I10 Essential (primary) hypertension: Secondary | ICD-10-CM | POA: Diagnosis not present

## 2022-02-11 DIAGNOSIS — R7303 Prediabetes: Secondary | ICD-10-CM

## 2022-02-11 LAB — GAMMA GT: GGT: 144 U/L — ABNORMAL HIGH (ref 7–51)

## 2022-02-11 NOTE — Progress Notes (Signed)
Office Note 02/11/2022  CC:  Chief Complaint  Patient presents with   Hypertension   Hyperlipidemia    Pt is not fasting   HPI:  Patient is a 46 y.o. male who is here for annual health maintenance exam and follow-up hypertension, hyperlipidemia, and bilateral lower extremity edema.  A plan is set for inguinal and umbilical hernia repairs 02/26/22.  No sx's in groin or abdomen in the last couple weeks.  Home blood pressures consistently normal.  Over the last 8 months or so he has made remarkable dietary changes. He has purposefully lost 40 pounds and feels great.  Past Medical History:  Diagnosis Date   Atypical chest pain since 2007   w/u has included a CT angio chest (neg), myoview (normal), EGD   Colon cancer screening 03/31/2021   03/31/21 iFOB neg. Rpt 1 yr.   Delayed gastric emptying    GERD (gastroesophageal reflux disease)    Hyperlipidemia    Intol of atorv, rosuva, lovalo, and zetia.  Good on pravastatin.   Hypertension    IFG (impaired fasting glucose) 2016   HbA1c 5.6%.  Repeat was 5.5% 01/2016.  Repeat 03/2017 5.8%.  01/2018 A1c 6.0%.. 03/2019 A1c 5.7%.   Insomnia    NASH (nonalcoholic steatohepatitis)    mild elev LFTs, viral hep serologies NEG.  Fatty liver on u/s 05/2020. Stable ultrasound 07/2021.   Obesity, Class II, BMI 35-39.9    Subclinical hypothyroidism 11/2020   11/2020->TSH very mildly elev, free T4 and T3 total NORMAL.    Past Surgical History:  Procedure Laterality Date   CARDIOVASCULAR STRESS TEST  07/2011   Normal, excellent exercise capacity   gastric emptying scan  2007   Delayed--reglan started    Family History  Problem Relation Age of Onset   Coronary artery disease Other    Arthritis Mother    Heart disease Mother    Hyperlipidemia Mother    Arthritis Father    Heart disease Father    Hyperlipidemia Father     Social History   Socioeconomic History   Marital status: Married    Spouse name: Not on file   Number of children:  Not on file   Years of education: Not on file   Highest education level: 12th grade  Occupational History   Not on file  Tobacco Use   Smoking status: Former   Smokeless tobacco: Current   Tobacco comments:    uses 1 can a day, was a periodic smoker, but not now.  Substance and Sexual Activity   Alcohol use: Yes   Drug use: No   Sexual activity: Yes  Other Topics Concern   Not on file  Social History Narrative   Married, 2 chldren.   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   Orig from [place].   He dips.  No smoking.  Alcohol intake: 2-3 beers a day on his days off.            Social Determinants of Health   Financial Resource Strain: Low Risk  (02/10/2022)   Overall Financial Resource Strain (CARDIA)    Difficulty of Paying Living Expenses: Not hard at all  Food Insecurity: No Food Insecurity (02/10/2022)   Hunger Vital Sign    Worried About Running Out of Food in the Last Year: Never true    Ran Out of Food in the Last Year: Never true  Transportation Needs: No Transportation Needs (02/10/2022)   PRAPARE - Hydrologist (Medical):  No    Lack of Transportation (Non-Medical): No  Physical Activity: Unknown (02/10/2022)   Exercise Vital Sign    Days of Exercise per Week: Patient refused    Minutes of Exercise per Session: Not on file  Stress: No Stress Concern Present (02/10/2022)   Inger    Feeling of Stress : Not at all  Social Connections: Unknown (02/10/2022)   Social Connection and Isolation Panel [NHANES]    Frequency of Communication with Friends and Family: More than three times a week    Frequency of Social Gatherings with Friends and Family: Once a week    Attends Religious Services: Patient refused    Marine scientist or Organizations: No    Attends Music therapist: Not on file    Marital Status: Married  Human resources officer Violence: Not on file     Outpatient Medications Prior to Visit  Medication Sig Dispense Refill   amLODipine (NORVASC) 10 MG tablet TAKE 1 TABLET BY MOUTH EVERY DAY 90 tablet 0   esomeprazole (NEXIUM) 40 MG capsule Take 1 capsule (40 mg total) by mouth 2 (two) times daily. 60 capsule 0   irbesartan-hydrochlorothiazide (AVALIDE) 150-12.5 MG tablet Take 2 tablets by mouth daily. 180 tablet 0   metoCLOPramide (REGLAN) 10 MG tablet Take 1 tablet (10 mg total) by mouth 2 (two) times daily. 180 tablet 3   metoprolol succinate (TOPROL-XL) 50 MG 24 hr tablet TAKE 1 TABLET BY MOUTH EVERY DAY WITH OR IMMEDIATELY FOLLOWING A MEAL 90 tablet 0   Multiple Vitamin (MULTIVITAMIN) tablet Take 1 tablet by mouth daily.     pravastatin (PRAVACHOL) 40 MG tablet TAKE 1 TABLET BY MOUTH EVERY DAY 90 tablet 3   dicyclomine (BENTYL) 10 MG capsule Take 1 capsule (10 mg total) by mouth 4 (four) times daily -  before meals and at bedtime. 60 capsule 0   furosemide (LASIX) 20 MG tablet TAKE 1 TABLET BY MOUTH EVERY DAY AS NEEDED FOR LOWER LEG SWELLING 90 tablet 0   No facility-administered medications prior to visit.    Allergies  Allergen Reactions   Ciprofloxacin     REACTION: Nausea    ROS Review of Systems  Constitutional:  Negative for appetite change, chills, fatigue and fever.  HENT:  Negative for congestion, dental problem, ear pain and sore throat.   Eyes:  Negative for discharge, redness and visual disturbance.  Respiratory:  Negative for cough, chest tightness, shortness of breath and wheezing.   Cardiovascular:  Negative for chest pain, palpitations and leg swelling.  Gastrointestinal:  Negative for abdominal pain, blood in stool, diarrhea, nausea and vomiting.  Genitourinary:  Negative for difficulty urinating, dysuria, flank pain, frequency, hematuria and urgency.  Musculoskeletal:  Negative for arthralgias, back pain, joint swelling, myalgias and neck stiffness.  Skin:  Negative for pallor and rash.  Neurological:   Negative for dizziness, speech difficulty, weakness and headaches.  Hematological:  Negative for adenopathy. Does not bruise/bleed easily.  Psychiatric/Behavioral:  Negative for confusion and sleep disturbance. The patient is not nervous/anxious.     PE;    02/11/2022    9:39 AM 11/03/2021    1:46 PM 07/10/2021    8:32 AM  Vitals with BMI  Height 5' 11"  5' 11"  5' 11"   Weight 223 lbs 6 oz 243 lbs 262 lbs 3 oz  BMI 31.17 09.47 09.62  Systolic 836 91 629  Diastolic 74 54 84  Pulse 72 101 84  Gen: Alert, well appearing.  Patient is oriented to person, place, time, and situation. AFFECT: pleasant, lucid thought and speech. ENT: Ears: EACs clear, normal epithelium.  TMs with good light reflex and landmarks bilaterally.  Eyes: no injection, icteris, swelling, or exudate.  EOMI, PERRLA. Nose: no drainage or turbinate edema/swelling.  No injection or focal lesion.  Mouth: lips without lesion/swelling.  Oral mucosa pink and moist.  Dentition intact and without obvious caries or gingival swelling.  Oropharynx without erythema, exudate, or swelling.  Neck: supple/nontender.  No LAD, mass, or TM.  Carotid pulses 2+ bilaterally, without bruits. CV: RRR, no m/r/g.   LUNGS: CTA bilat, nonlabored resps, good aeration in all lung fields. ABD: soft, NT, ND, BS normal.  No hepatospenomegaly or mass.  No bruits. EXT: no clubbing, cyanosis, or edema.  Musculoskeletal: no joint swelling, erythema, warmth, or tenderness.  ROM of all joints intact. Skin - no sores or suspicious lesions or rashes or color changes  Pertinent labs:  Lab Results  Component Value Date   TSH 5.65 (H) 07/10/2021   Lab Results  Component Value Date   WBC 4.9 07/10/2021   HGB 15.4 07/10/2021   HCT 45.2 07/10/2021   MCV 91.9 07/10/2021   PLT 238 07/10/2021   Lab Results  Component Value Date   CREATININE 0.80 07/10/2021   BUN 12 07/10/2021   NA 135 07/10/2021   K 4.6 07/10/2021   CL 97 (L) 07/10/2021   CO2 24  07/10/2021   Lab Results  Component Value Date   ALT 36 07/10/2021   AST 41 (H) 07/10/2021   ALKPHOS 170 (H) 07/01/2021   BILITOT 0.8 07/10/2021   Lab Results  Component Value Date   CHOL 178 12/02/2020   Lab Results  Component Value Date   HDL 53.60 12/02/2020   Lab Results  Component Value Date   LDLCALC 111 (H) 12/02/2020   Lab Results  Component Value Date   TRIG 66.0 12/02/2020   Lab Results  Component Value Date   CHOLHDL 3 12/02/2020   Lab Results  Component Value Date   HGBA1C 5.8 (H) 07/10/2021   ASSESSMENT AND PLAN:   #1 hypertension, well-controlled on amlodipine 10 mg a day, irbesartan-HCTZ 150-12.5 two tabs daily, and Toprol-XL 50 mg daily. Electrolytes and creatinine today.  2.  Hypercholesterolemia.  Has been doing well on pravastatin 40 mg a day. Lipid and hepatic panel today.  #3 elevated alkaline phosphatase. Also very mild elevation in AST. Have been monitoring these. SPEP and UPEP normal back in January this year. Checking hepatic panel and GGT today.  4. Health maintenance exam: Reviewed age and gender appropriate health maintenance issues (prudent diet, regular exercise, health risks of tobacco and excessive alcohol, use of seatbelts, fire alarms in home, use of sunscreen).  Also reviewed age and gender appropriate health screening as well as vaccine recommendations. Vaccines: Tdap->deferred.  Otherwise all utd. Labs: Fasting health panel, hemoglobin A1c, thyroid panel (subclin hypoth). Prostate ca screening: average risk patient= as per latest guidelines, start screening at 107 yrs of age. Colon ca screening: iFOB -->given today.  An After Visit Summary was printed and given to the patient.  FOLLOW UP: 6 months  Signed:  Crissie Sickles, MD           02/11/2022

## 2022-02-11 NOTE — Patient Instructions (Signed)
Health Maintenance, Male Adopting a healthy lifestyle and getting preventive care are important in promoting health and wellness. Ask your health care provider about: The right schedule for you to have regular tests and exams. Things you can do on your own to prevent diseases and keep yourself healthy. What should I know about diet, weight, and exercise? Eat a healthy diet  Eat a diet that includes plenty of vegetables, fruits, low-fat dairy products, and lean protein. Do not eat a lot of foods that are high in solid fats, added sugars, or sodium. Maintain a healthy weight Body mass index (BMI) is a measurement that can be used to identify possible weight problems. It estimates body fat based on height and weight. Your health care provider can help determine your BMI and help you achieve or maintain a healthy weight. Get regular exercise Get regular exercise. This is one of the most important things you can do for your health. Most adults should: Exercise for at least 150 minutes each week. The exercise should increase your heart rate and make you sweat (moderate-intensity exercise). Do strengthening exercises at least twice a week. This is in addition to the moderate-intensity exercise. Spend less time sitting. Even light physical activity can be beneficial. Watch cholesterol and blood lipids Have your blood tested for lipids and cholesterol at 47 years of age, then have this test every 5 years. You may need to have your cholesterol levels checked more often if: Your lipid or cholesterol levels are high. You are older than 47 years of age. You are at high risk for heart disease. What should I know about cancer screening? Many types of cancers can be detected early and may often be prevented. Depending on your health history and family history, you may need to have cancer screening at various ages. This may include screening for: Colorectal cancer. Prostate cancer. Skin cancer. Lung  cancer. What should I know about heart disease, diabetes, and high blood pressure? Blood pressure and heart disease High blood pressure causes heart disease and increases the risk of stroke. This is more likely to develop in people who have high blood pressure readings or are overweight. Talk with your health care provider about your target blood pressure readings. Have your blood pressure checked: Every 3-5 years if you are 18-39 years of age. Every year if you are 40 years old or older. If you are between the ages of 65 and 75 and are a current or former smoker, ask your health care provider if you should have a one-time screening for abdominal aortic aneurysm (AAA). Diabetes Have regular diabetes screenings. This checks your fasting blood sugar level. Have the screening done: Once every three years after age 45 if you are at a normal weight and have a low risk for diabetes. More often and at a younger age if you are overweight or have a high risk for diabetes. What should I know about preventing infection? Hepatitis B If you have a higher risk for hepatitis B, you should be screened for this virus. Talk with your health care provider to find out if you are at risk for hepatitis B infection. Hepatitis C Blood testing is recommended for: Everyone born from 1945 through 1965. Anyone with known risk factors for hepatitis C. Sexually transmitted infections (STIs) You should be screened each year for STIs, including gonorrhea and chlamydia, if: You are sexually active and are younger than 47 years of age. You are older than 47 years of age and your   health care provider tells you that you are at risk for this type of infection. Your sexual activity has changed since you were last screened, and you are at increased risk for chlamydia or gonorrhea. Ask your health care provider if you are at risk. Ask your health care provider about whether you are at high risk for HIV. Your health care provider  may recommend a prescription medicine to help prevent HIV infection. If you choose to take medicine to prevent HIV, you should first get tested for HIV. You should then be tested every 3 months for as long as you are taking the medicine. Follow these instructions at home: Alcohol use Do not drink alcohol if your health care provider tells you not to drink. If you drink alcohol: Limit how much you have to 0-2 drinks a day. Know how much alcohol is in your drink. In the [place] one drink equals one 12 oz bottle of beer (355 mL), one 5 oz glass of wine (148 mL), or one 1 oz glass of hard liquor (44 mL). Lifestyle Do not use any products that contain nicotine or tobacco. These products include cigarettes, chewing tobacco, and vaping devices, such as e-cigarettes. If you need help quitting, ask your health care provider. Do not use street drugs. Do not share needles. Ask your health care provider for help if you need support or information about quitting drugs. General instructions Schedule regular health, dental, and eye exams. Stay current with your vaccines. Tell your health care provider if: You often feel depressed. You have ever been abused or do not feel safe at home. Summary Adopting a healthy lifestyle and getting preventive care are important in promoting health and wellness. Follow your health care provider's instructions about healthy diet, exercising, and getting tested or screened for diseases. Follow your health care provider's instructions on monitoring your cholesterol and blood pressure. This information is not intended to replace advice given to you by your health care provider. Make sure you discuss any questions you have with your health care provider. Document Revised: 11/10/2020 Document Reviewed: 11/10/2020 Elsevier Patient Education  2023 [employer]  

## 2022-02-12 LAB — HEMOGLOBIN A1C
Hgb A1c MFr Bld: 5.1 % of total Hgb (ref <5.7)
Mean Plasma Glucose: 100 mg/dL
eAG (mmol/L): 5.5 mmol/L

## 2022-02-12 LAB — CBC
HCT: 40.7 % (ref 38.5–50.0)
Hemoglobin: 13.9 g/dL (ref 13.2–17.1)
MCH: 30.5 pg (ref 27.0–33.0)
MCHC: 34.2 g/dL (ref 32.0–36.0)
MCV: 89.5 fL (ref 80.0–100.0)
MPV: 9.9 fL (ref 7.5–12.5)
Platelets: 255 10*3/uL (ref 140–400)
RBC: 4.55 10*6/uL (ref 4.20–5.80)
RDW: 13.1 % (ref 11.0–15.0)
WBC: 4.5 10*3/uL (ref 3.8–10.8)

## 2022-02-12 LAB — LIPID PANEL
Cholesterol: 175 mg/dL (ref <200)
HDL: 44 mg/dL (ref >=40)
LDL Cholesterol (Calc): 111 mg/dL (calc) — ABNORMAL HIGH
Non-HDL Cholesterol (Calc): 131 mg/dL (calc) — ABNORMAL HIGH (ref <130)
Total CHOL/HDL Ratio: 4 (calc) (ref <5.0)
Triglycerides: 98 mg/dL (ref <150)

## 2022-02-12 LAB — COMPREHENSIVE METABOLIC PANEL
AG Ratio: 1.4 (calc) (ref 1.0–2.5)
ALT: 24 U/L (ref 9–46)
AST: 22 U/L (ref 10–40)
Albumin: 4.6 g/dL (ref 3.6–5.1)
Alkaline phosphatase (APISO): 117 U/L (ref 36–130)
BUN: 14 mg/dL (ref 7–25)
CO2: 26 mmol/L (ref 20–32)
Calcium: 9.7 mg/dL (ref 8.6–10.3)
Chloride: 102 mmol/L (ref 98–110)
Creat: 0.81 mg/dL (ref 0.60–1.29)
Globulin: 3.3 g/dL (calc) (ref 1.9–3.7)
Glucose, Bld: 88 mg/dL (ref 65–99)
Potassium: 4 mmol/L (ref 3.5–5.3)
Sodium: 138 mmol/L (ref 135–146)
Total Bilirubin: 0.7 mg/dL (ref 0.2–1.2)
Total Protein: 7.9 g/dL (ref 6.1–8.1)

## 2022-02-12 LAB — TSH: TSH: 2.64 mIU/L (ref 0.40–4.50)

## 2022-02-12 LAB — T3: T3, Total: 160 ng/dL (ref 76–181)

## 2022-02-12 LAB — T4, FREE: Free T4: 1.2 ng/dL (ref 0.8–1.8)

## 2022-02-16 ENCOUNTER — Encounter (HOSPITAL_BASED_OUTPATIENT_CLINIC_OR_DEPARTMENT_OTHER): Payer: Self-pay | Admitting: Surgery

## 2022-02-17 ENCOUNTER — Encounter (HOSPITAL_BASED_OUTPATIENT_CLINIC_OR_DEPARTMENT_OTHER): Payer: Self-pay | Admitting: Surgery

## 2022-02-17 NOTE — Progress Notes (Signed)
Spoke w/ via phone for pre-op interview--- pt Lab needs dos----  [facility], ekg             Lab results------ pt cmp result in epic 02-11-2022 COVID test -----patient states asymptomatic no test needed Arrive at -------  1215 on 02-26-2022 NPO after MN NO Solid Food.  Clear liquids from MN until--- 1115 Med rec completed Medications to take morning of surgery ----- toptol Diabetic medication ----- n/a Patient instructed no nail polish to be worn day of surgery Patient instructed to bring photo id and insurance card day of surgery Patient aware to have Driver (ride ) / caregiver for 24 hours after surgery -- wife, joanne Patient Special Instructions ----- n/a Pre-Op special Istructions ----- n/a Patient verbalized understanding of instructions that were given at this phone interview. Patient denies shortness of breath, chest pain, fever, cough at this phone interview.

## 2022-02-18 ENCOUNTER — Other Ambulatory Visit: Payer: Self-pay | Admitting: Family Medicine

## 2022-02-26 ENCOUNTER — Other Ambulatory Visit: Payer: Self-pay

## 2022-02-26 ENCOUNTER — Encounter (HOSPITAL_BASED_OUTPATIENT_CLINIC_OR_DEPARTMENT_OTHER): Payer: Self-pay | Admitting: Surgery

## 2022-02-26 ENCOUNTER — Ambulatory Visit (HOSPITAL_BASED_OUTPATIENT_CLINIC_OR_DEPARTMENT_OTHER)
Admission: RE | Admit: 2022-02-26 | Discharge: 2022-02-26 | Disposition: A | Discharge status: Home / Self Care | Payer: No Typology Code available for payment source | Attending: Surgery | Admitting: Surgery

## 2022-02-26 ENCOUNTER — Encounter (HOSPITAL_BASED_OUTPATIENT_CLINIC_OR_DEPARTMENT_OTHER)
Admission: RE | Disposition: A | Discharge status: Home / Self Care | Payer: Self-pay | Source: Home / Self Care | Attending: Surgery

## 2022-02-26 ENCOUNTER — Ambulatory Visit (HOSPITAL_BASED_OUTPATIENT_CLINIC_OR_DEPARTMENT_OTHER): Payer: No Typology Code available for payment source | Admitting: Anesthesiology

## 2022-02-26 DIAGNOSIS — Z8261 Family history of arthritis: Secondary | ICD-10-CM | POA: Insufficient documentation

## 2022-02-26 DIAGNOSIS — I10 Essential (primary) hypertension: Secondary | ICD-10-CM | POA: Diagnosis not present

## 2022-02-26 DIAGNOSIS — K219 Gastro-esophageal reflux disease without esophagitis: Secondary | ICD-10-CM | POA: Insufficient documentation

## 2022-02-26 DIAGNOSIS — E038 Other specified hypothyroidism: Secondary | ICD-10-CM | POA: Diagnosis not present

## 2022-02-26 DIAGNOSIS — Z01818 Encounter for other preprocedural examination: Secondary | ICD-10-CM

## 2022-02-26 DIAGNOSIS — K429 Umbilical hernia without obstruction or gangrene: Secondary | ICD-10-CM

## 2022-02-26 DIAGNOSIS — M1990 Unspecified osteoarthritis, unspecified site: Secondary | ICD-10-CM | POA: Insufficient documentation

## 2022-02-26 DIAGNOSIS — Z8249 Family history of ischemic heart disease and other diseases of the circulatory system: Secondary | ICD-10-CM | POA: Diagnosis not present

## 2022-02-26 DIAGNOSIS — K4090 Unilateral inguinal hernia, without obstruction or gangrene, not specified as recurrent: Secondary | ICD-10-CM | POA: Insufficient documentation

## 2022-02-26 DIAGNOSIS — Z87891 Personal history of nicotine dependence: Secondary | ICD-10-CM | POA: Insufficient documentation

## 2022-02-26 HISTORY — DX: Unilateral inguinal hernia, without obstruction or gangrene, not specified as recurrent: K40.90

## 2022-02-26 HISTORY — PX: INGUINAL HERNIA REPAIR: SHX194

## 2022-02-26 HISTORY — PX: UMBILICAL HERNIA REPAIR: SHX196

## 2022-02-26 HISTORY — DX: Prediabetes: R73.03

## 2022-02-26 HISTORY — DX: Personal history of other specified conditions: Z87.898

## 2022-02-26 HISTORY — DX: Localized edema: R60.0

## 2022-02-26 HISTORY — DX: Umbilical hernia without obstruction or gangrene: K42.9

## 2022-02-26 HISTORY — DX: Unspecified osteoarthritis, unspecified site: M19.90

## 2022-02-26 LAB — POCT I-STAT, CHEM 8
BUN: 8 mg/dL (ref 6–20)
Calcium, Ion: 1.24 mmol/L (ref 1.15–1.40)
Chloride: 101 mmol/L (ref 98–111)
Creatinine, Ser: 0.5 mg/dL — ABNORMAL LOW (ref 0.61–1.24)
Glucose, Bld: 94 mg/dL (ref 70–99)
HCT: 41 % (ref 39.0–52.0)
Hemoglobin: 13.9 g/dL (ref 13.0–17.0)
Potassium: 3.7 mmol/L (ref 3.5–5.1)
Sodium: 139 mmol/L (ref 135–145)
TCO2: 23 mmol/L (ref 22–32)

## 2022-02-26 SURGERY — REPAIR, HERNIA, INGUINAL, LAPAROSCOPIC
Anesthesia: General | Site: Abdomen | Laterality: Right

## 2022-02-26 MED ORDER — GABAPENTIN 300 MG PO CAPS: 300.0000 mg | ORAL_CAPSULE | ORAL | Status: DC

## 2022-02-26 MED ORDER — ACETAMINOPHEN 500 MG PO TABS
1000.0000 mg | ORAL_TABLET | ORAL | Status: AC
  Administered 2022-02-26: 1000 mg via ORAL

## 2022-02-26 MED ORDER — CHLORHEXIDINE GLUCONATE CLOTH 2 % EX PADS: 6.0000 | MEDICATED_PAD | Freq: Once | CUTANEOUS | Status: DC

## 2022-02-26 MED ORDER — CEFAZOLIN SODIUM-DEXTROSE 2-4 GM/100ML-% IV SOLN
2.0000 g | INTRAVENOUS | Status: AC
  Administered 2022-02-26: 2 g via INTRAVENOUS

## 2022-02-26 MED ORDER — MIDAZOLAM HCL 2 MG/2ML IJ SOLN
INTRAMUSCULAR | Status: AC
  Filled 2022-02-26: qty 2

## 2022-02-26 MED ORDER — LACTATED RINGERS IV SOLN: INTRAVENOUS | Status: DC

## 2022-02-26 MED ORDER — ROCURONIUM BROMIDE 10 MG/ML (PF) SYRINGE
PREFILLED_SYRINGE | INTRAVENOUS | Status: AC
  Filled 2022-02-26: qty 10

## 2022-02-26 MED ORDER — LIDOCAINE HCL (PF) 2 % IJ SOLN
INTRAMUSCULAR | Status: AC
  Filled 2022-02-26: qty 5

## 2022-02-26 MED ORDER — FENTANYL CITRATE (PF) 100 MCG/2ML IJ SOLN: 25.0000 ug | INTRAMUSCULAR | Status: DC | PRN

## 2022-02-26 MED ORDER — ONDANSETRON HCL 4 MG/2ML IJ SOLN
INTRAMUSCULAR | Status: AC
  Filled 2022-02-26: qty 2

## 2022-02-26 MED ORDER — CEFAZOLIN SODIUM-DEXTROSE 2-4 GM/100ML-% IV SOLN
INTRAVENOUS | Status: AC
  Filled 2022-02-26: qty 100

## 2022-02-26 MED ORDER — ACETAMINOPHEN 500 MG PO TABS: 1000.0000 mg | ORAL_TABLET | ORAL | Status: DC

## 2022-02-26 MED ORDER — BUPIVACAINE LIPOSOME 1.3 % IJ SUSP
INTRAMUSCULAR | Status: DC | PRN
  Administered 2022-02-26: 20 mL

## 2022-02-26 MED ORDER — BUPIVACAINE HCL (PF) 0.5 % IJ SOLN
INTRAMUSCULAR | Status: DC | PRN
  Administered 2022-02-26: 30 mL

## 2022-02-26 MED ORDER — DEXAMETHASONE SODIUM PHOSPHATE 10 MG/ML IJ SOLN
INTRAMUSCULAR | Status: DC | PRN
  Administered 2022-02-26: 10 mg via INTRAVENOUS

## 2022-02-26 MED ORDER — OXYCODONE-ACETAMINOPHEN 5-325 MG PO TABS: 1.0000 | ORAL_TABLET | ORAL | 0 refills | Status: DC | PRN

## 2022-02-26 MED ORDER — KETOROLAC TROMETHAMINE 30 MG/ML IJ SOLN
INTRAMUSCULAR | Status: AC
  Filled 2022-02-26: qty 1

## 2022-02-26 MED ORDER — BUPIVACAINE LIPOSOME 1.3 % IJ SUSP: 20.0000 mL | Freq: Once | INTRAMUSCULAR | Status: DC

## 2022-02-26 MED ORDER — EPHEDRINE SULFATE-NACL 50-0.9 MG/10ML-% IV SOSY
PREFILLED_SYRINGE | INTRAVENOUS | Status: DC | PRN
  Administered 2022-02-26 (×2): 5 mg via INTRAVENOUS

## 2022-02-26 MED ORDER — ROCURONIUM BROMIDE 10 MG/ML (PF) SYRINGE
PREFILLED_SYRINGE | INTRAVENOUS | Status: DC | PRN
  Administered 2022-02-26: 20 mg via INTRAVENOUS
  Administered 2022-02-26: 70 mg via INTRAVENOUS

## 2022-02-26 MED ORDER — GABAPENTIN 300 MG PO CAPS
ORAL_CAPSULE | ORAL | Status: AC
  Filled 2022-02-26: qty 1

## 2022-02-26 MED ORDER — MIDAZOLAM HCL 2 MG/2ML IJ SOLN
INTRAMUSCULAR | Status: DC | PRN
  Administered 2022-02-26: 2 mg via INTRAVENOUS

## 2022-02-26 MED ORDER — LIDOCAINE 2% (20 MG/ML) 5 ML SYRINGE
INTRAMUSCULAR | Status: DC | PRN
  Administered 2022-02-26: 60 mg via INTRAVENOUS

## 2022-02-26 MED ORDER — ACETAMINOPHEN 500 MG PO TABS
ORAL_TABLET | ORAL | Status: AC
  Filled 2022-02-26: qty 2

## 2022-02-26 MED ORDER — FENTANYL CITRATE (PF) 250 MCG/5ML IJ SOLN
INTRAMUSCULAR | Status: DC | PRN
Start: 2022-02-26 — End: 2022-02-26
  Administered 2022-02-26 (×3): 50 ug via INTRAVENOUS

## 2022-02-26 MED ORDER — SUGAMMADEX SODIUM 200 MG/2ML IV SOLN
INTRAVENOUS | Status: DC | PRN
  Administered 2022-02-26: 400 mg via INTRAVENOUS

## 2022-02-26 MED ORDER — ONDANSETRON HCL 4 MG/2ML IJ SOLN
INTRAMUSCULAR | Status: DC | PRN
  Administered 2022-02-26: 4 mg via INTRAVENOUS

## 2022-02-26 MED ORDER — GABAPENTIN 300 MG PO CAPS
300.0000 mg | ORAL_CAPSULE | ORAL | Status: AC
  Administered 2022-02-26: 300 mg via ORAL

## 2022-02-26 MED ORDER — PROPOFOL 10 MG/ML IV BOLUS
INTRAVENOUS | Status: DC | PRN
  Administered 2022-02-26: 180 mg via INTRAVENOUS

## 2022-02-26 MED ORDER — KETOROLAC TROMETHAMINE 15 MG/ML IJ SOLN
15.0000 mg | INTRAMUSCULAR | Status: AC
  Administered 2022-02-26: 30 mg via INTRAVENOUS

## 2022-02-26 MED ORDER — FENTANYL CITRATE (PF) 250 MCG/5ML IJ SOLN
INTRAMUSCULAR | Status: AC
  Filled 2022-02-26: qty 5

## 2022-02-26 MED ORDER — DEXAMETHASONE SODIUM PHOSPHATE 10 MG/ML IJ SOLN
INTRAMUSCULAR | Status: AC
  Filled 2022-02-26: qty 1

## 2022-02-26 SURGICAL SUPPLY — 63 items
ADH SKN CLS APL DERMABOND .7 (GAUZE/BANDAGES/DRESSINGS) ×2
APL PRP STRL LF DISP 70% ISPRP (MISCELLANEOUS) ×2
BLADE CLIPPER SENSICLIP SURGIC (BLADE) IMPLANT
BLADE SURG 15 STRL LF DISP TIS (BLADE) ×2 IMPLANT
BLADE SURG 15 STRL SS (BLADE) ×2
CABLE HIGH FREQUENCY MONO STRZ (ELECTRODE) ×2 IMPLANT
CHLORAPREP W/TINT 26 (MISCELLANEOUS) ×2 IMPLANT
COVER BACK TABLE 60X90IN (DRAPES) ×2 IMPLANT
COVER MAYO STAND STRL (DRAPES) ×2 IMPLANT
DECANTER SPIKE VIAL GLASS SM (MISCELLANEOUS) IMPLANT
DERMABOND ADVANCED (GAUZE/BANDAGES/DRESSINGS) ×2
DERMABOND ADVANCED .7 DNX12 (GAUZE/BANDAGES/DRESSINGS) ×2 IMPLANT
DRAPE LAPAROTOMY 100X72 PEDS (DRAPES) ×2 IMPLANT
DRAPE UTILITY XL STRL (DRAPES) ×2 IMPLANT
ELECT COATED BLADE 2.86 ST (ELECTRODE) ×2 IMPLANT
ELECT REM PT RETURN 9FT ADLT (ELECTROSURGICAL) ×2
ELECTRODE REM PT RTRN 9FT ADLT (ELECTROSURGICAL) ×2 IMPLANT
GAUZE 4X4 16PLY ~~LOC~~+RFID DBL (SPONGE) ×2 IMPLANT
GLOVE BIO SURGEON STRL SZ7.5 (GLOVE) ×2 IMPLANT
GLOVE BIOGEL PI IND STRL 7.5 (GLOVE) ×2 IMPLANT
GLOVE BIOGEL PI IND STRL 8 (GLOVE) ×2 IMPLANT
GLOVE BIOGEL PI INDICATOR 7.5 (GLOVE) ×2
GLOVE BIOGEL PI INDICATOR 8 (GLOVE) ×2
GOWN STRL REUS W/ TWL XL LVL3 (GOWN DISPOSABLE) ×2 IMPLANT
GOWN STRL REUS W/TWL XL LVL3 (GOWN DISPOSABLE) ×6 IMPLANT
GRASPER SUT TROCAR 14GX15 (MISCELLANEOUS) ×2 IMPLANT
IRRIG SUCT STRYKERFLOW 2 WTIP (MISCELLANEOUS)
IRRIGATION SUCT STRKRFLW 2 WTP (MISCELLANEOUS) IMPLANT
KIT TURNOVER CYSTO (KITS) ×2 IMPLANT
MESH 3DMAX 5X7 RT XLRG (Mesh General) IMPLANT
MESH BARD SOFT 3X6IN (Mesh General) IMPLANT
NDL INSUFFLATION 14GA 120MM (NEEDLE) ×2 IMPLANT
NEEDLE HYPO 22GX1.5 SAFETY (NEEDLE) ×2 IMPLANT
NEEDLE INSUFFLATION 14GA 120MM (NEEDLE) ×2 IMPLANT
NS IRRIG 1000ML POUR BTL (IV SOLUTION) IMPLANT
PACK BASIN DAY SURGERY FS (CUSTOM PROCEDURE TRAY) ×2 IMPLANT
PENCIL SMOKE EVACUATOR (MISCELLANEOUS) ×2 IMPLANT
RELOAD STAPLE 4.0 BLU F/HERNIA (INSTRUMENTS) IMPLANT
RELOAD STAPLE 4.8 BLK F/HERNIA (STAPLE) IMPLANT
RELOAD STAPLE HERNIA 4.0 BLUE (INSTRUMENTS) IMPLANT
RELOAD STAPLE HERNIA 4.8 BLK (STAPLE) ×2 IMPLANT
SCISSORS LAP 5X35 DISP (ENDOMECHANICALS) ×2 IMPLANT
SET TUBE SMOKE EVAC HIGH FLOW (TUBING) ×2 IMPLANT
SPONGE T-LAP 18X18 ~~LOC~~+RFID (SPONGE) IMPLANT
SPONGE T-LAP 4X18 ~~LOC~~+RFID (SPONGE) ×2 IMPLANT
STAPLER HERNIA 12 8.5 360D (INSTRUMENTS) IMPLANT
SUT MNCRL AB 4-0 PS2 18 (SUTURE) ×2 IMPLANT
SUT NOVA NAB GS-21 0 18 T12 DT (SUTURE) IMPLANT
SUT VIC AB 0 SH 27 (SUTURE) IMPLANT
SUT VIC AB 2-0 SH 27 (SUTURE)
SUT VIC AB 2-0 SH 27XBRD (SUTURE) IMPLANT
SUT VIC AB 3-0 SH 27 (SUTURE) ×2
SUT VIC AB 3-0 SH 27X BRD (SUTURE) ×2 IMPLANT
SUT VICRYL 0 UR6 27IN ABS (SUTURE) IMPLANT
SUT VICRYL AB 3 0 TIES (SUTURE) IMPLANT
SUT VLOC 180 2-0 9IN GS21 (SUTURE) IMPLANT
SYR CONTROL 10ML LL (SYRINGE) ×2 IMPLANT
TOWEL OR 17X26 10 PK STRL BLUE (TOWEL DISPOSABLE) ×2 IMPLANT
TRAY FOL W/BAG SLVR 16FR STRL (SET/KITS/TRAYS/PACK) IMPLANT
TRAY FOLEY W/BAG SLVR 16FR LF (SET/KITS/TRAYS/PACK)
TRAY LAPAROSCOPIC (CUSTOM PROCEDURE TRAY) ×2 IMPLANT
TROCAR Z-THREAD FIOS 12X100MM (TROCAR) ×2 IMPLANT
TROCAR Z-THREAD FIOS 5X100MM (TROCAR) ×4 IMPLANT

## 2022-02-26 NOTE — Discharge Instructions (Addendum)
GROIN HERNIA REPAIR POST OPERATIVE INSTRUCTIONS  Thinking Clearly  The anesthesia may cause you to feel different for 1 or 2 days. Do not drive, drink alcohol, or make any big decisions for at least 2 days.  Nutrition When you wake up, you will be able to drink small amounts of liquid. If you do not feel sick, you can slowly advance your diet to regular foods. Continue to drink lots of fluids, usually about 8 to 10 glasses per day. Eat a high-fiber diet so you don't strain during bowel movements. High-Fiber Foods Foods high in fiber include beans, bran cereals and whole-grain breads, peas, dried fruit (figs, apricots, and dates), raspberries, blackberries, strawberries, sweet corn, broccoli, baked potatoes with skin, plums, pears, apples, greens, and nuts. Activity Slowly increase your activity. Be sure to get up and walk every hour or so to prevent blood clots. No heavy lifting or strenuous activity for 4 weeks following surgery to prevent hernias at your incision sites or recurrence of your hernia. It is normal to feel tired. You may need more sleep than usual.  Get your rest but make sure to get up and move around frequently to prevent blood clots and pneumonia.  Work and Return to [employer] can go back to work when you feel well enough. Discuss the timing with your surgeon. You can usually go back to school or work 1 week or less after an laparoscopic or an open repair. If your work requires heavy lifting or strenuous activity you need to be placed on light duty for 4 weeks following surgery. You can return to gym class, sports or other physical activities 4 weeks after surgery.  Wound Care You may experience significant bruising in the groin including into the scrotum in males.  Rest, elevating the groin and scrotum above the level of the heart, ice and compression with tight fitting underwear can help.  Always wash your hands before and after touching near your incision site. Do  not soak in a bathtub until cleared at your follow up appointment. You may take a shower 24 hours after surgery. A small amount of drainage from the incision is normal. If the drainage is thick and yellow or the site is red, you may have an infection, so call your surgeon. If you have a drain in one of your incisions, it will be taken out in office when the drainage stops. Steri-Strips will fall off in 7 to 10 days or they will be removed during your first office visit. If you have dermabond glue covering over the incision, allow the glue to flake off on its own. Protect the new skin, especially from the sun. The sun can burn and cause darker scarring. Your scar will heal in about 4 to 6 weeks and will become softer and continue to fade over the next year.  The cosmetic appearance of the incisions will improve over the course of the first year after surgery. Sensation around your incision will return in a few weeks or months.  Bowel Movements After intestinal surgery, you may have loose watery stools for several days. If watery diarrhea lasts longer than 3 days, contact your surgeon. Pain medication (narcotics) can cause constipation. Increase the fiber in your diet with high-fiber foods if you are constipated. You can take an over the counter stool softener like Colace to avoid constipation.  Additional over the counter medications can also be used if Colace isn't sufficient (for example, Milk of Magnesia or Miralax).  Pain The amount of pain is different for each person. Some people need only 1 to 3 doses of pain control medication, while others need more. Take alternating doses of tylenol and ibuprofen around the clock for the first five days following surgery.  This will provide a baseline of pain control and help with inflammation.  Take the narcotic pain medication in addition if needed for severe pain.  Contact Your Surgeon at [phone], if you have: Pain that will not go away Pain that  gets worse A fever of more than 101F (38.3C) Repeated vomiting Swelling, redness, bleeding, or bad-smelling drainage from your wound site Strong abdominal pain No bowel movement or unable to pass gas for 3 days Watery diarrhea lasting longer than 3 days  Pain Control The goal of pain control is to minimize pain, keep you moving and help you heal. Your surgical team will work with you on your pain plan. Most often a combination of therapies and medications are used to control your pain. You may also be given medication (local anesthetic) at the surgical site. This may help control your pain for several days. Extreme pain puts extra stress on your body at a time when your body needs to focus on healing. Do not wait until your pain has reached a level "10" or is unbearable before telling your doctor or nurse. It is much easier to control pain before it becomes severe. Following a laparoscopic procedure, pain is sometimes felt in the shoulder. This is due to the gas inserted into your abdomen during the procedure. Moving and walking helps to decrease the gas and the right shoulder pain.  Use the guide below for ways to manage your post-operative pain. Learn more by going to facs.org/safepaincontrol.  How Intense Is My Pain Common Therapies to Feel Better       I hardly notice my pain, and it does not interfere with my activities.  I notice my pain and it distracts me, but I can still do activities (sitting up, walking, standing).  Non-Medication Therapies  Ice (in a bag, applied over clothing at the surgical site), elevation, rest, meditation, massage, distraction (music, TV, play) walking and mild exercise Splinting the abdomen with pillows +  Non-Opioid Medications Acetaminophen (Tylenol) Non-steroidal anti-inflammatory drugs (NSAIDS) Aspirin, Ibuprofen (Motrin, Advil) Naproxen (Aleve) Take these as needed, when you feel pain. Both acetaminophen and NSAIDs help to decrease pain  and swelling (inflammation).      My pain is hard to ignore and is more noticeable even when I rest.  My pain interferes with my usual activities.  Non-Medication Therapies  +  Non-Opioid medications  Take on a regular schedule (around-the-clock) instead of as needed. (For example, Tylenol every 6 hours at 9:00 am, 3:00 pm, 9:00 pm, 3:00 am and Motrin every 6 hours at 12:00 am, 6:00 am, 12:00 pm, 6:00 pm)         I am focused on my pain, and I am not doing my daily activities.  I am groaning in pain, and I cannot sleep. I am unable to do anything.  My pain is as bad as it could be, and nothing else matters.  Non-Medication Therapies  +  Around-the-Clock Non-Opioid Medications  +  Short-acting opioids  Opioids should be used with other medications to manage severe pain. Opioids block pain and give a feeling of euphoria (feel high). Addiction, a serious side effect of opioids, is rare with short-term (a few days) use.  Examples of short-acting opioids  include: Tramadol (Ultram), Hydrocodone (Norco, Vicodin), Hydromorphone (Dilaudid), Oxycodone (Oxycontin)     The above directions have been adapted from the [employer] Surgical Patient Education Program.  Please refer to the ACS website if needed: PreferredVet.ca.ashx   [name], MD [facility], [place], [place], [place], [place]  [id number] ?  [address], [place], [place]   [address] [phone] ? [phone] ? FAX [phone] Web site: [email or web address]   Post Anesthesia Home Care Instructions  Activity: Get plenty of rest for the remainder of the day. A responsible individual must stay with you for 24 hours following the procedure.  For the next 24 hours, DO NOT: -Drive a car -Paediatric nurse -Drink alcoholic beverages -Take any medication unless instructed by your  physician -Make any legal decisions or sign important papers.  Meals: Start with liquid foods such as gelatin or soup. Progress to regular foods as tolerated. Avoid greasy, spicy, heavy foods. If nausea and/or vomiting occur, drink only clear liquids until the nausea and/or vomiting subsides. Call your physician if vomiting continues.  Special Instructions/Symptoms: Your throat may feel dry or sore from the anesthesia or the breathing tube placed in your throat during surgery. If this causes discomfort, gargle with warm salt water. The discomfort should disappear within 24 hours.  Information for Discharge Teaching: EXPAREL (bupivacaine liposome injectable suspension)   Your surgeon or anesthesiologist gave you EXPAREL(bupivacaine) to help control your pain after surgery.  EXPAREL is a local anesthetic that provides pain relief by numbing the tissue around the surgical site. EXPAREL is designed to release pain medication over time and can control pain for up to 72 hours. Depending on how you respond to EXPAREL, you may require less pain medication during your recovery.  Possible side effects: Temporary loss of sensation or ability to move in the area where bupivacaine was injected. Nausea, vomiting, constipation Rarely, numbness and tingling in your mouth or lips, lightheadedness, or anxiety may occur. Call your doctor right away if you think you may be experiencing any of these sensations, or if you have other questions regarding possible side effects.  Follow all other discharge instructions given to you by your surgeon or nurse. Eat a healthy diet and drink plenty of water or other fluids.  If you return to the hospital for any reason within 96 hours following the administration of EXPAREL, it is important for health care providers to know that you have received this anesthetic. A teal colored band has been placed on your arm with the date, time and amount of EXPAREL you have received in  order to alert and inform your health care providers. Please leave this armband in place for the full 96 hours following administration, and then you may remove the band.   You can remove the teal armband on Tuesday 03/02/22  No acetaminophen/Tylenol until after 7:30 pm today if needed.   No ibuprofen, Advil, Aleve, Motrin, ketorolac, meloxicam, naproxen, or other NSAIDS until after 10:30 pm today if needed.

## 2022-02-26 NOTE — Anesthesia Procedure Notes (Signed)
Procedure Name: Intubation Date/Time: 02/26/2022 3:06 PM  Performed by: Clearnce Sorrel, CRNAPre-anesthesia Checklist: Patient identified, Emergency Drugs available, Suction available and Patient being monitored Patient Re-evaluated:Patient Re-evaluated prior to induction Oxygen Delivery Method: Circle System Utilized Preoxygenation: Pre-oxygenation with 100% oxygen Induction Type: IV induction Ventilation: Mask ventilation without difficulty Laryngoscope Size: Mac and 4 Grade View: Grade I Tube type: Oral Tube size: 7.5 mm Number of attempts: 1 Airway Equipment and Method: Stylet and Oral airway Placement Confirmation: ETT inserted through vocal cords under direct vision, positive ETCO2 and breath sounds checked- equal and bilateral Secured at: 23 cm Tube secured with: Tape Dental Injury: Teeth and Oropharynx as per pre-operative assessment

## 2022-02-26 NOTE — Op Note (Signed)
Patient: Carlos James (December 24, 1975, [id number])  Date of Surgery: 02/26/2022   Preoperative Diagnosis: RIGHT INGUINAL HERNIA AND UMBILICAL HERNIA   Postoperative Diagnosis: RIGHT INGUINAL HERNIA AND UMBILICAL HERNIA   Surgical Procedure: LAPAROSCOPIC RIGHT INGUINAL HERNIA REPAIR WITH MESH: [id number] OPEN UMBILICAL HERNIA REPAIR WITH MESH:    Operative Team Members:  Surgeon(s) and Role:    * Shiesha Jahn, Nickola Major, MD - Primary   Anesthesiologist: Murvin Natal, MD; Gloris Manchester March Rummage, DO CRNA: Mechele Claude, CRNA; Clearnce Sorrel, CRNA   Anesthesia: General   Fluids:  Total I/O In: 500 [I.V.:500] Out: 5 [YEMVV:6]  Complications: None  Drains:  None  Specimen: None  Disposition:  PACU - hemodynamically stable.  Plan of Care: Discharge to home after PACU  Indications for Procedure: Carlos James is a 46 y.o. male who presented with symptomatic umbilical hernia and right inguinal hernia.  I recommended laparoscopic right inguinal hernia repair with open umbilical hernia repair for Carlos James. The procedure itself as well as its risk, benefits, and alternatives were discussed in full. After full discussion all questions answered the patient granted consent to proceed.   Findings:  Umbilical hernia: Hernia Location: umbilical Hernia Size:  2 cm wide x 1 cm tall  Mesh Size &Type:  7.5cm x 8cm Bard Soft mesh Mesh Position: Preperitoneal  Right inguinal hernia: Technique: Transabdominal preperitoneal (TAPP) Hernia Location: Right indirect inguinal hernia Mesh Size &Type:  Bard 3D Max Extra large right sided mesh Mesh Fixation: Endo-Universal hernia stapler  Infection status: Patient: Private Patient Elective Case Case: Elective Infection Present At Time Of Surgery (PATOS): None   Description of Procedure:    A curvilinear incision was made below the umbilicus and dissection was carried down through the subcutaneous tissue to the level of the fascia.  The  umbilical stalk was encircled and the hernia sac amputated off the umbilical skin.  The hernia defect was measured as 2 cm wide by 1 cm in vertical dimension.    An umbilical hernia repair with mesh was performed.  The hernia sac was scored along the perimeter of the fascial defect, entering the pre peritoneal plane.  A wide pre peritoneal dissection was performed creating a space for mesh placement.  Prior to placing the mesh, we performed the inguinal hernia repair.   A 12 mm trocar was inserted at the periumbilical incision.  Additional 5 mm trocars were placed in the left and right abdomen.  There was no trauma to the underlying viscera.  There was an indirect hernia on the RIGHT.  Utilizing a transabdominal pre peritoneal technique (TAPP), a horizontal incision was made in the peritoneum, immediately below the umbilicus.  Dissection was carried out in the pre peritoneal space down to the level of the hernia sac which was reduced into the peritoneal cavity completely.  The cord contents were parietalized and preserved.  A large pre peritoneal dissection was performed to uncover the direct, indirect, femoral and obturator spaces.  Cooper's ligament was uncovered medially and the psoas muscle uncovered laterally.  The mesh, as documented above, was opened and advanced into the pre peritoneal position so that it more than adequately covered the indirect, direct, femoral and obturator spaces.  The mesh laid flat, with no inferior folds and covered the entire myopectineal orifice.  The mesh was fixated with the endo-universal hernia stapler to Cooper's ligament and the posterior aspect of the rectus muscle.  The peritoneal flap was closed with the same device.  There were  no peritoneal defects or exposed mesh at the conclusion.  A piece of Bard Soft was opened and trimmed to 7.5 cm x 8 cm and deployed into the pre peritoneal space.  The mesh laid in taut position in the pre peritoneal plane and did not  require fixation.  The space was irrigated with normal saline.  The fascial defect was closed horizontally, overtop the mesh with figure of eight 0 vicryl suture.  The umbilical skin was tacked down to the fascial repair with 2-0 vicryl suture.  The skin was closed with 4-0 Monocryl subcuticular suture and skin glue.    [name], MD General, Bariatric, & Minimally Invasive Surgery [facility], [place]

## 2022-02-26 NOTE — H&P (Signed)
Admitting Physician: Nickola Major Bintou Lafata  Service: General surgery  CC: Hernia  Subjective   HPI: Carlos James is an 46 y.o. male who is here for hernia repair  Past Medical History:  Diagnosis Date   Arthritis    Delayed gastric emptying 2007   per imaging in epic 08/ 2007   Edema of both lower extremities    GERD (gastroesophageal reflux disease)    History of precordial chest pain    evaluated by cardiologist, dr Burt Knack, for atypical cp lov note in epic 08-09-2011 pt released prn ;  normal nuclear stress test 07-27-2011 w/ ef 61%;  normal ETT 2008 per note   Hyperlipidemia    Intol of atorv, rosuva, lovalo, and zetia.  Good on pravastatin.   Hypertension    Insomnia    NASH (nonalcoholic steatohepatitis)    followed by pcp---  mild elev LFTs, viral hep serologies NEG.  Fatty liver on u/s 05/2020. Stable ultrasound 07/2021.   Pre-diabetes    Right inguinal hernia    Subclinical hypothyroidism 11/2020   11/2020->TSH very mildly elev, free T4 and T3 total NORMAL.   Umbilical hernia without obstruction and without gangrene     Past Surgical History:  Procedure Laterality Date   NO PAST SURGERIES      Family History  Problem Relation Age of Onset   Coronary artery disease Other    Arthritis Mother    Heart disease Mother    Hyperlipidemia Mother    Arthritis Father    Heart disease Father    Hyperlipidemia Father     Social:  reports that he quit smoking about 20 years ago. His smoking use included cigarettes. His smokeless tobacco use includes snuff. He reports current alcohol use. He reports that he does not use drugs.  Allergies:  Allergies  Allergen Reactions   Ciprofloxacin Nausea Only    Medications: Current Outpatient Medications  Medication Instructions   amLODipine (NORVASC) 10 MG tablet TAKE 1 TABLET BY MOUTH EVERY DAY   esomeprazole (NEXIUM) 40 mg, Oral, 2 times daily   furosemide (LASIX) 20 mg, Oral, As needed    irbesartan-hydrochlorothiazide (AVALIDE) 150-12.5 MG tablet 2 tablets, Oral, Daily   metoCLOPramide (REGLAN) 10 mg, Oral, 2 times daily   metoprolol succinate (TOPROL-XL) 50 MG 24 hr tablet TAKE 1 TABLET BY MOUTH EVERY DAY WITH OR IMMEDIATELY FOLLOWING A MEAL   Multiple Vitamin (MULTIVITAMIN) tablet 1 tablet, Oral, Daily   pravastatin (PRAVACHOL) 40 MG tablet TAKE 1 TABLET BY MOUTH EVERY DAY    ROS - all of the below systems have been reviewed with the patient and positives are indicated with bold text General: chills, fever or night sweats Eyes: blurry vision or double vision ENT: epistaxis or sore throat Allergy/Immunology: itchy/watery eyes or nasal congestion Hematologic/Lymphatic: bleeding problems, blood clots or swollen lymph nodes Endocrine: temperature intolerance or unexpected weight changes Breast: new or changing breast lumps or nipple discharge Resp: cough, shortness of breath, or wheezing CV: chest pain or dyspnea on exertion GI: as per HPI GU: dysuria, trouble voiding, or hematuria MSK: joint pain or joint stiffness Neuro: TIA or stroke symptoms Derm: pruritus and skin lesion changes Psych: anxiety and depression  Objective   PE Blood pressure (!) 145/84, pulse 67, temperature 98 F (36.7 C), temperature source Oral, resp. rate 17, height 5' 11"  (1.803 m), weight 99.7 kg, SpO2 99 %. Constitutional: NAD; conversant; no deformities Eyes: Moist conjunctiva; no lid lag; anicteric; PERRL Neck: Trachea midline; no thyromegaly  Lungs: Normal respiratory effort; no tactile fremitus CV: RRR; no palpable thrills; no pitting edema GI: Abd Umbilical and right inguinal hernias; no palpable hepatosplenomegaly MSK: Normal range of motion of extremities; no clubbing/cyanosis Psychiatric: Appropriate affect; alert and oriented x3 Lymphatic: No palpable cervical or axillary lymphadenopathy  Results for orders placed or performed during the hospital encounter of 02/26/22 (from the  past 24 hour(s))  I-STAT, chem 8     Status: Abnormal   Collection Time: 02/26/22  1:13 PM  Result Value Ref Range   Sodium 139 135 - 145 mmol/L   Potassium 3.7 3.5 - 5.1 mmol/L   Chloride 101 98 - 111 mmol/L   BUN 8 6 - 20 mg/dL   Creatinine, Ser 0.50 (L) 0.61 - 1.24 mg/dL   Glucose, Bld 94 70 - 99 mg/dL   Calcium, Ion 1.24 1.15 - 1.40 mmol/L   TCO2 23 22 - 32 mmol/L   Hemoglobin 13.9 13.0 - 17.0 g/dL   HCT 41.0 39.0 - 52.0 %    Imaging Orders  No imaging studies ordered today     Assessment and Plan   Umbilical hernia without obstruction and without gangrene  Right inguinal hernia    I recommended laparoscopic right inguinal hernia repair with open umbilical hernia repair for Carlos James. The procedure itself as well as its risk, benefits, and alternatives were discussed in full. After full discussion all questions answered the patient granted consent to proceed.     Felicie Morn, MD  [facility] Use [email or web address] to contact on call provider

## 2022-02-26 NOTE — Transfer of Care (Signed)
Immediate Anesthesia Transfer of Care Note  Patient: Manish Ruggiero  Procedure(s) Performed: Procedure(s) (LRB): LAPAROSCOPIC RIGHT INGUINAL HERNIA REPAIR WITH MESH (Right) OPEN UMBILICAL HERNIA REPAIR WITH MESH (N/A)  Patient Location: PACU  Anesthesia Type: General  Level of Consciousness: awake, alert  and oriented  Airway & Oxygen Therapy: Patient Spontanous Breathing on room air.  Post-op Assessment: Report given to PACU RN and Post -op Vital signs reviewed and stable  Post vital signs: Reviewed and stable  Complications: No apparent anesthesia complications  Last Vitals:  Vitals Value Taken Time  BP 120/94 02/26/22 1635  Temp 36.4 C 02/26/22 1635  Pulse 63 02/26/22 1641  Resp 17 02/26/22 1641  SpO2 94 % 02/26/22 1641  Vitals shown include unvalidated device data.  Last Pain:  Vitals:   02/26/22 1316  TempSrc: Oral  PainSc: 0-No pain      Patients Stated Pain Goal: 5 (90/30/09 2330)  Complications: No notable events documented.

## 2022-02-26 NOTE — Anesthesia Preprocedure Evaluation (Addendum)
Anesthesia Evaluation  Patient identified by MRN, date of birth, ID band Patient awake    Reviewed: Allergy & Precautions, NPO status , Patient's Chart, lab work & pertinent test results  Airway Mallampati: III  TM Distance: >3 FB Neck ROM: Full    Dental no notable dental hx.    Pulmonary neg pulmonary ROS, former smoker,    Pulmonary exam normal        Cardiovascular hypertension,  Rhythm:Regular Rate:Normal     Neuro/Psych  Headaches, negative psych ROS   GI/Hepatic GERD  ,Inguinal hernia   Endo/Other  Hypothyroidism   Renal/GU   negative genitourinary   Musculoskeletal  (+) Arthritis , Osteoarthritis,    Abdominal Normal abdominal exam  (+)   Peds  Hematology negative hematology ROS (+)   Anesthesia Other Findings   Reproductive/Obstetrics                           Anesthesia Physical Anesthesia Plan  ASA: 2  Anesthesia Plan: General   Post-op Pain Management: Gabapentin PO (pre-op)* and Tylenol PO (pre-op)*   Induction: Intravenous  PONV Risk Score and Plan: 2 and Ondansetron, Dexamethasone, Midazolam and Treatment may vary due to age or medical condition  Airway Management Planned: Mask and Oral ETT  Additional Equipment: None  Intra-op Plan:   Post-operative Plan: Extubation in OR  Informed Consent: I have reviewed the patients History and Physical, chart, labs and discussed the procedure including the risks, benefits and alternatives for the proposed anesthesia with the patient or authorized representative who has indicated his/her understanding and acceptance.     Dental advisory given  Plan Discussed with: CRNA  Anesthesia Plan Comments:         Anesthesia Quick Evaluation

## 2022-02-27 NOTE — Anesthesia Postprocedure Evaluation (Signed)
Anesthesia Post Note  Patient: [name]  Procedure(s) Performed: LAPAROSCOPIC RIGHT INGUINAL HERNIA REPAIR WITH MESH (Right: Abdomen) OPEN UMBILICAL HERNIA REPAIR WITH MESH (Abdomen)     Patient location during evaluation: PACU Anesthesia Type: General Level of consciousness: awake Pain management: pain level controlled Vital Signs Assessment: post-procedure vital signs reviewed and stable Respiratory status: spontaneous breathing, nonlabored ventilation, respiratory function stable and patient connected to nasal cannula oxygen Cardiovascular status: blood pressure returned to baseline and stable Postop Assessment: no apparent nausea or vomiting Anesthetic complications: no   No notable events documented.  Last Vitals:  Vitals:   02/26/22 1700 02/26/22 1725  BP: 133/80 (!) 147/92  Pulse: 61 89  Resp: 13 15  Temp:  36.6 C  SpO2: 94% 95%    Last Pain:  Vitals:   02/26/22 1725  TempSrc:   PainSc: 0-No pain                 Kassity Woodson P Alexandre Lightsey

## 2022-03-01 ENCOUNTER — Encounter (HOSPITAL_BASED_OUTPATIENT_CLINIC_OR_DEPARTMENT_OTHER): Payer: Self-pay | Admitting: Surgery

## 2022-03-02 ENCOUNTER — Other Ambulatory Visit: Payer: Self-pay | Admitting: Family Medicine

## 2022-04-02 ENCOUNTER — Other Ambulatory Visit: Payer: Self-pay | Admitting: Family Medicine

## 2022-04-09 ENCOUNTER — Ambulatory Visit (INDEPENDENT_AMBULATORY_CARE_PROVIDER_SITE_OTHER): Payer: No Typology Code available for payment source | Admitting: Podiatrist

## 2022-04-09 ENCOUNTER — Ambulatory Visit (INDEPENDENT_AMBULATORY_CARE_PROVIDER_SITE_OTHER): Payer: No Typology Code available for payment source

## 2022-04-09 DIAGNOSIS — M79674 Pain in right toe(s): Secondary | ICD-10-CM

## 2022-04-09 DIAGNOSIS — M79675 Pain in left toe(s): Secondary | ICD-10-CM | POA: Diagnosis not present

## 2022-04-09 DIAGNOSIS — M2042 Other hammer toe(s) (acquired), left foot: Secondary | ICD-10-CM

## 2022-04-09 DIAGNOSIS — M2041 Other hammer toe(s) (acquired), right foot: Secondary | ICD-10-CM

## 2022-04-09 NOTE — Patient Instructions (Signed)
Toe Deformity Repair Toe deformity repair is a surgery to reposition a toe, or toes, that are bent. For example, a hammer toe is a deformity that causes the middle joint of a toe to stay bent. A toe deformity can cause pain and may interfere with walking. Surgery may be needed if other treatments have not helped to straighten the toe and relieved symptoms. The surgeon may do the procedure through small incisions (percutaneous technique) or through one larger incision, known as open surgery, to repair your toe deformity. Tell a health care provider about: Any allergies you have. All medicines you are taking, including vitamins, herbs, eye drops, creams, and over-the-counter medicines. Any problems you or family members have had with anesthetic medicines. Any blood disorders you have. Any surgeries you have had. Any medical conditions you have. Whether you are pregnant or may be pregnant. What are the risks? Generally, this is a safe procedure. However, problems may occur, including: Infection. Bleeding. Allergic reactions to medicines. Damage to nerves. Swelling. Pain. Numbness. Scarring. Toe positioning that is not straight (poor toe alignment). What happens before the procedure? Staying hydrated Follow instructions from your health care provider about hydration, which may include: Up to 2 hours before the procedure - you may continue to drink clear liquids, such as water, clear fruit juice, black coffee, and plain tea.  Eating and drinking restrictions Follow instructions from your health care provider about eating and drinking, which may include: 8 hours before the procedure - stop eating heavy meals or foods, such as meat, fried foods, or fatty foods. 6 hours before the procedure - stop eating light meals or foods, such as toast or cereal. 6 hours before the procedure - stop drinking milk or drinks that contain milk. 2 hours before the procedure - stop drinking clear  liquids. Medicines Ask your health care provider about: Changing or stopping your regular medicines. This is especially important if you are taking diabetes medicines or blood thinners. Taking medicines such as aspirin and ibuprofen. These medicines can thin your blood. Do not take these medicines unless your health care provider tells you to take them. Taking over-the-counter medicines, vitamins, herbs, and supplements. General instructions Ask your health care provider: How your surgery site will be marked. What steps will be taken to help prevent infection. These steps may include: Removing hair at the surgery site. Washing skin with a germ-killing soap. Taking antibiotic medicine. You may be asked to shower with a germ-killing soap. Do not use any products that contain nicotine or tobacco for at least 4 weeks before the procedure. These products include cigarettes, e-cigarettes, and chewing tobacco. If you need help quitting, ask your health care provider. Plan to have someone take you home from the hospital or clinic. Plan to have a responsible adult care for you for at least 24 hours after you leave the hospital or clinic. This is important. What happens during the procedure?  An IV will be inserted into one of your veins. You will be given one of the following: A medicine to help you relax (sedative). A medicine to numb the area (local anesthetic). The medicine will be injected directly into your toe. A medicine that is injected into an area of your body to numb everything below the injection site (regional anesthetic). A medicine to make you fall asleep (general anesthetic). The surgeon will make one or more incisions on your affected toe. Extra bone or extra soft tissue will be removed if it is causing the deformity.  Connective tissues such as tendons or ligaments will be removed or relocated if they are causing the deformity. Surgical pins, screws, or wires may be used to hold  your toe in place during the healing process. The incisions will be closed with stitches (sutures), skin glue, or surgical tape. A bandage (dressing) will be placed over the incision area. The procedure may vary among health care providers and hospitals. What happens after the procedure? Your blood pressure, heart rate, breathing rate, and blood oxygen level will be monitored until you leave the hospital or clinic. You will be given pain medicine as needed. You may have a post-operative shoe placed on your foot. If you were given a sedative during the procedure, it can affect you for several hours. Do not drive or operate machinery until your health care provider says that it is safe. Summary A toe that stays bent can be repaired with surgery (toe deformity repair). You may need surgery if other treatments have not helped to straighten the toe and relieve your symptoms. Follow instructions from your health care provider about eating and drinking before the procedure. Surgical pins, screws, or wires may be used to hold your toe in place during the healing process. This information is not intended to replace advice given to you by your health care provider. Make sure you discuss any questions you have with your health care provider. Document Revised: 09/27/2019 Document Reviewed: 09/27/2019 Elsevier Patient Education  [place].

## 2022-04-14 ENCOUNTER — Encounter: Payer: Self-pay | Admitting: Podiatrist

## 2022-04-14 NOTE — Progress Notes (Signed)
Chief Complaint  Patient presents with   Hammer Toe    On going for years - right second toe is worst than left- pain varies pain is not constant no redness or swelling Bil second toes are touching great toes      HPI: Patient is 46 y.o. male who presents today for pain in right second toes right greater than left for several years duration.  He relates the pain varies and that bilateral second toes rub on the great toes.  He has tried using spacers with no relief.   Patient Active Problem List   Diagnosis Date Noted   Umbilical hernia without obstruction and without gangrene 11/03/2021   Olecranon bursitis, left elbow 11/15/2016   Obesity, Class I, BMI 30-34.9 02/28/2014   Headache(784.0) 07/22/2011   LOW BACK PAIN, CHRONIC 03/06/2009   FOOT PAIN, LEFT 10/17/2008   UNS ADVRS EFF UNS RX MEDICINAL&BIOLOGICAL SBSTNC 01/16/2008   CHEST PAIN, ATYPICAL 10/09/2007   Hypertension 02/23/2007   GERD (gastroesophageal reflux disease) 02/23/2007   Hyperlipidemia 02/16/2007   ADVEF, DRUG/MED/BIOL SUBST, ARTHUS PHENOMENON 02/16/2007    Current Outpatient Medications on File Prior to Visit  Medication Sig Dispense Refill   amLODipine (NORVASC) 10 MG tablet TAKE 1 TABLET BY MOUTH EVERY DAY (Patient taking differently: Take 10 mg by mouth at bedtime.) 90 tablet 0   esomeprazole (NEXIUM) 40 MG capsule Take 1 capsule (40 mg total) by mouth 2 (two) times daily. (Patient taking differently: Take 40 mg by mouth at bedtime.) 60 capsule 0   furosemide (LASIX) 20 MG tablet Take 20 mg by mouth as needed.     irbesartan-hydrochlorothiazide (AVALIDE) 150-12.5 MG tablet TAKE 2 TABLETS BY MOUTH DAILY 180 tablet 1   metoCLOPramide (REGLAN) 10 MG tablet TAKE 1 TABLET BY MOUTH TWICE A DAY 180 tablet 1   metoprolol succinate (TOPROL-XL) 50 MG 24 hr tablet TAKE 1 TABLET BY MOUTH EVERY DAY WITH OR IMMEDIATELY FOLLOWING A MEAL (Patient taking differently: Take 50 mg by mouth daily. TAKE 1 TABLET BY MOUTH EVERY DAY WITH  OR IMMEDIATELY FOLLOWING A MEAL) 90 tablet 0   Multiple Vitamin (MULTIVITAMIN) tablet Take 1 tablet by mouth daily.     oxyCODONE-acetaminophen (PERCOCET) 5-325 MG tablet Take 1 tablet by mouth every 4 (four) hours as needed for severe pain. 20 tablet 0   pravastatin (PRAVACHOL) 40 MG tablet TAKE 1 TABLET BY MOUTH EVERY DAY (Patient taking differently: Take 40 mg by mouth daily.) 90 tablet 3   No current facility-administered medications on file prior to visit.    Allergies  Allergen Reactions   Ciprofloxacin Nausea Only    Review of Systems No fevers, chills, nausea, muscle aches, no difficulty breathing, no calf pain, no chest pain or shortness of breath.   Physical Exam  GENERAL APPEARANCE: Alert, conversant. Appropriately groomed. No acute distress.   VASCULAR: Pedal pulses palpable 2/4 DP and  PT bilateral.  Capillary refill time is immediate to all digits,  Proximal to distal cooling is warm to warm.  Digital perfusion adequate.   NEUROLOGIC: sensation is intact to 5.07 monofilament at 5/5 sites bilateral.  Light touch is intact bilateral, vibratory sensation intact bilateral  MUSCULOSKELETAL: acceptable muscle strength, tone and stability bilateral.  Contracture hammertoe of the second digit at the dipj and pipj noted right worse than left.  Second toes rubbing on lateral hallux bilateral as well.    DERMATOLOGIC: skin is warm, supple, and dry.  Color, texture, and turgor of skin within normal limits.  No open wounds are noted.  No preulcerative lesions are seen.  Digital nails are asymptomatic.    Xrays 2 views of bilateral feet are obtained.  Contracture hammertoe with medial translation of the second digit and the mpj noted right greater than left.  No acute osseous abnormalities are seen on xray.     Assessment     ICD-10-CM   1. Pain in toes of both feet  M79.674 DG Foot 2 Views Right   M79.675 DG Foot Complete Left    2. Hammertoe of second toe of left foot  M20.42      3. Hammertoe of second toe of right foot  M20.41        Plan  Discussed exam and xray findings.,  discussed the use of pads and shoe changes and discussed that ultimately, should he want to have the toes fixed, surgery is required.  We briefly discussed surgery and discussed that fixation with a kwire or screw would likely be needed. Also briefly discussed post op recovery in a surgical shoe after surgery.  He is interested in having his second toes fixed, likely the right foot and then the left at a later date.  He will be scheduled to see Dr. Jacqualyn Posey for a pre operative consult and will discuss further treatment recommendations with Dr. Jacqualyn Posey at that time.

## 2022-04-24 ENCOUNTER — Other Ambulatory Visit: Payer: Self-pay | Admitting: Family Medicine

## 2022-05-04 ENCOUNTER — Ambulatory Visit (INDEPENDENT_AMBULATORY_CARE_PROVIDER_SITE_OTHER): Payer: No Typology Code available for payment source | Admitting: Podiatry

## 2022-05-04 DIAGNOSIS — M21961 Unspecified acquired deformity of right lower leg: Secondary | ICD-10-CM | POA: Diagnosis not present

## 2022-05-04 DIAGNOSIS — M2042 Other hammer toe(s) (acquired), left foot: Secondary | ICD-10-CM

## 2022-05-04 DIAGNOSIS — M21962 Unspecified acquired deformity of left lower leg: Secondary | ICD-10-CM | POA: Diagnosis not present

## 2022-05-04 DIAGNOSIS — M2041 Other hammer toe(s) (acquired), right foot: Secondary | ICD-10-CM | POA: Diagnosis not present

## 2022-05-04 NOTE — Progress Notes (Signed)
Subjective:   Patient ID: Carlos James, male   DOB: 46 y.o.   MRN: [id number]   HPI Chief Complaint  Patient presents with   Hammer Toe    Per Dr. Valentina Lucks note: Contracture hammertoe with medial translation of the second digit and the mpj noted right greater than left.  No acute osseous abnormalities are seen on xray.  Surgery Consult today    46 year old male presents above concerns.  It has been ongling for a "long time". It hurts worse in the morning. The right bothers him more than the left.the right rubs the shoe.  No recent injuries.  Negative tobacco Occasional etoh use.    Review of Systems  All other systems reviewed and are negative.   Past Medical History:  Diagnosis Date   Arthritis    Delayed gastric emptying 2007   per imaging in epic 08/ 2007   Edema of both lower extremities    GERD (gastroesophageal reflux disease)    History of precordial chest pain    evaluated by cardiologist, dr Burt Knack, for atypical cp lov note in epic 08-09-2011 pt released prn ;  normal nuclear stress test 07-27-2011 w/ ef 61%;  normal ETT 2008 per note   Hyperlipidemia    Intol of atorv, rosuva, lovalo, and zetia.  Good on pravastatin.   Hypertension    Insomnia    NASH (nonalcoholic steatohepatitis)    followed by pcp---  mild elev LFTs, viral hep serologies NEG.  Fatty liver on u/s 05/2020. Stable ultrasound 07/2021.   Pre-diabetes    Right inguinal hernia    Subclinical hypothyroidism 11/2020   11/2020->TSH very mildly elev, free T4 and T3 total NORMAL.   Umbilical hernia without obstruction and without gangrene     Past Surgical History:  Procedure Laterality Date   INGUINAL HERNIA REPAIR Right 02/26/2022   Procedure: LAPAROSCOPIC RIGHT INGUINAL HERNIA REPAIR WITH MESH;  Surgeon: Stechschulte, Nickola Major, MD;  Location: [facility];  Service: General;  Laterality: Right;   NO PAST SURGERIES     UMBILICAL HERNIA REPAIR N/A 02/26/2022   Procedure: OPEN UMBILICAL  HERNIA REPAIR WITH MESH;  Surgeon: Stechschulte, Nickola Major, MD;  Location: [facility];  Service: General;  Laterality: N/A;     Current Outpatient Medications:    amLODipine (NORVASC) 10 MG tablet, TAKE 1 TABLET BY MOUTH EVERY DAY (Patient taking differently: Take 10 mg by mouth at bedtime.), Disp: 90 tablet, Rfl: 0   esomeprazole (NEXIUM) 40 MG capsule, Take 1 capsule (40 mg total) by mouth 2 (two) times daily. (Patient taking differently: Take 40 mg by mouth at bedtime.), Disp: 60 capsule, Rfl: 0   furosemide (LASIX) 20 MG tablet, Take 20 mg by mouth as needed., Disp: , Rfl:    irbesartan-hydrochlorothiazide (AVALIDE) 150-12.5 MG tablet, TAKE 2 TABLETS BY MOUTH DAILY, Disp: 180 tablet, Rfl: 1   metoCLOPramide (REGLAN) 10 MG tablet, TAKE 1 TABLET BY MOUTH TWICE A DAY, Disp: 180 tablet, Rfl: 1   metoprolol succinate (TOPROL-XL) 50 MG 24 hr tablet, TAKE 1 TABLET BY MOUTH EVERY DAY WITH OR IMMEDIATELY FOLLOWING A MEAL, Disp: 90 tablet, Rfl: 1   Multiple Vitamin (MULTIVITAMIN) tablet, Take 1 tablet by mouth daily., Disp: , Rfl:    oxyCODONE-acetaminophen (PERCOCET) 5-325 MG tablet, Take 1 tablet by mouth every 4 (four) hours as needed for severe pain., Disp: 20 tablet, Rfl: 0   pravastatin (PRAVACHOL) 40 MG tablet, TAKE 1 TABLET BY MOUTH EVERY DAY (Patient taking  differently: Take 40 mg by mouth daily.), Disp: 90 tablet, Rfl: 3  Allergies  Allergen Reactions   Ciprofloxacin Nausea Only          Objective:  Physical Exam  General: AAO x3, NAD  Dermatological: Skin is warm, dry and supple bilateral.  There are no open sores, no preulcerative lesions, no rash or signs of infection present.  Vascular: Dorsalis Pedis artery and Posterior Tibial artery pedal pulses are 2/4 bilateral with immedate capillary fill time.  There is no pain with calf compression, swelling, warmth, erythema.   Neruologic: Grossly intact via light touch bilateral.  Musculoskeletal: Hammertoe present of  the second digit bilaterally.  There is transverse plane deformity noted of the digit as well.  No significant instability present to the MPJ.  Discomfort along the hammertoe itself.  No area pinpoint tenderness.  MMT 5/5.  Gait: Unassisted, Nonantalgic.       Assessment:   Hammertoe deformity, metatarsal deformity     Plan:  -Treatment options discussed including all alternatives, risks, and complications -Etiology of symptoms were discussed -Independent reviewed the x-rays that were done at last appointment also reviewed them with him.  No evidence of acute fracture.  Hammertoes present.  Elongated second metatarsal noted.  Likely previous stress fracture of the third metatarsal which is healed. -We discussed both conservative as well as surgical treatment options.  Conservatively continue shoe modifications, offloading and splinting to help however this is likely to progress over time.  Discussed surgical intervention including PIPJ arthrodesis as well as second metatarsal shortening osteotomy with screw, K wire fixation.  I did consider fixing the plantar plate as well.  Discussed the surgery as postoperative course.  Likely be immobilized for 6 weeks postoperatively has surgical shoe or boot.  He will consider his options discussed with his wife.  He does not undergo surgery continue shoes that are supporting extra-depth shoes to avoid any pressure.  Trula Slade DPM

## 2022-05-04 NOTE — Patient Instructions (Signed)
Hammer Toe Hammer toe is a change in the shape, or a deformity, of the toe. The deformity causes the middle joint of the toe to stay bent. Hammer toe starts gradually. At first, the toe can be straightened. Then over time, the toe deformity becomes stiff, inflexible, and permanently bent. Hammer toe usually affects the second, third, or fourth toe. A hammer toe causes pain, especially when wearing shoes. Corns and calluses can result from the toe rubbing against the inside of the shoe. Early treatments to keep the toe straight may relieve pain. As the deformity of the toe becomes stiff and permanent, surgery may be needed to straighten the toe. What are the causes? This condition is caused by abnormal bending of the toe joint that is closest to your foot. Over time, the toe bending downward pulls on the muscles and connections (tendons) of the toe joint, making them weak and stiff. Wearing shoes that are too narrow in the toe box and do not allow toes to fully straighten can cause this condition. What increases the risk? You are more likely to develop this condition if you: Are an older male. Wear shoes that are too small, or wear high-heeled shoes that pinch your toes. Have a second toe that is longer than your big toe (first toe). Injure your foot or toe. Have arthritis, or have a nerve or muscle disorder. Have diabetes or a condition known as Charcot joint, which may cause you to walk abnormally. Have a family history of hammer toe. Are a Engineer, mining. What are the signs or symptoms? Pain and deformity of the toe are the main symptoms of this condition. The pain is worse when wearing shoes, walking, or running. Other symptoms may include: A thickened patch of skin, called a corn or callus, that forms over the top of the bent part of the toe or between the toes. Redness and a burning feeling on the bent toe. An open sore that forms on the top of the bent toe. Not being able to straighten  the affected toe. How is this diagnosed? This condition is diagnosed based on your symptoms and a physical exam. During the exam, your health care provider will try to straighten your toe to see how stiff the deformity is. You may also have tests, such as: A blood test to check for rheumatoid arthritis or diabetes. An X-ray to show how severe the toe deformity is. How is this treated? Treatment for this condition depends on whether the toe is flexible or deformed and no longer moveable. In less severe cases, a hammer toe can be straightened without surgery. These treatments include: Taping the toe into a straightened position. Using pads and cushions to protect the bent toe. Wearing shoes that provide enough room for the toes. Doing toe-stretching exercises at home. Taking an NSAID, such as ibuprofen, to reduce pain and swelling. Using special orthotics or insoles for pain relief and to improve walking. If these treatments do not help or the toe has a severe deformity and cannot be straightened, surgery is the next option. The most common surgeries used to straighten a hammer toe include: Arthroplasty or osteotomy. Part of the toe joint is reconstructed or removed, which allows the toe to straighten. Fusion. Cartilage between the two bones of the joint is taken out, and the bones are fused together into one longer bone. Implantation. Part of the bone is removed and replaced with an implant to allow the toe to move again. Flexor tendon transfer.  The tendons that curl the toes down (flexor tendons) are repositioned. Follow these instructions at home: Take over-the-counter and prescription medicines only as told by your health care provider. Do toe-straightening and stretching exercises as told by your health care provider. Keep all follow-up visits. This is important. How is this prevented? Wear shoes that fit properly and give your toes enough room. Shoes should not cause pain. Buy shoes at  the end of the day to make sure they fit well, since your foot may swell during the day. Make sure they are comfortable before you buy them. As you age, your shoe size might change, including the width. Measure both feet and buy shoes for the larger foot. A shoe repair store might be able to stretch shoes that feel tight in spots. Do not wear high-heeled shoes or shoes with pointed toes. Contact a health care provider if: Your pain gets worse. Your toe becomes red or swollen. You develop an open sore on your toe. Summary Hammer toe is a condition that gradually causes your toe to become bent and stiff. Hammer toe can be treated by taping the toe into a straightened position and doing toe-stretching exercises. If these treatments do not help, surgery may be needed. To prevent this condition, wear shoes that fit properly, give your toes enough room, and do not cause pain. This information is not intended to replace advice given to you by your health care provider. Make sure you discuss any questions you have with your health care provider. Document Revised: 09/27/2019 Document Reviewed: 09/27/2019 Elsevier Patient Education  [place].

## 2022-05-14 ENCOUNTER — Other Ambulatory Visit: Payer: Self-pay | Admitting: Family Medicine

## 2022-05-14 DIAGNOSIS — I10 Essential (primary) hypertension: Secondary | ICD-10-CM

## 2022-08-11 ENCOUNTER — Other Ambulatory Visit: Payer: Self-pay | Admitting: Family Medicine

## 2022-08-11 DIAGNOSIS — I10 Essential (primary) hypertension: Secondary | ICD-10-CM

## 2022-09-03 ENCOUNTER — Other Ambulatory Visit: Payer: Self-pay | Admitting: Family Medicine

## 2022-09-03 DIAGNOSIS — I10 Essential (primary) hypertension: Secondary | ICD-10-CM

## 2022-10-14 ENCOUNTER — Other Ambulatory Visit: Payer: Self-pay | Admitting: Family Medicine

## 2022-10-14 DIAGNOSIS — I10 Essential (primary) hypertension: Secondary | ICD-10-CM

## 2022-10-14 MED ORDER — AMLODIPINE BESYLATE 10 MG PO TABS: 10.0000 mg | ORAL_TABLET | Freq: Every day | ORAL | 0 refills | Status: DC

## 2022-10-15 ENCOUNTER — Other Ambulatory Visit: Payer: Self-pay | Admitting: Family Medicine

## 2022-10-22 NOTE — Patient Instructions (Incomplete)
It was very nice to see you today!   PLEASE NOTE:   If you had any lab tests, please let us know if you have not heard back within a few days. You may see your results on MyChart before we have a chance to review them but we will give you a call once they are reviewed by [place]. If we ordered any referrals today, please let us know if you have not heard from their office within the next 2 weeks.   TAKE TWO OF YOUR  METOPROLOL TABS EVERY DAY SEND A MYCHART MESSAGE TO ME IN 10-14 DAYS WITH YOUR BLOOD PRESSURE AND HEART RATE MEASURMENTS FROM HOME.

## 2022-10-26 ENCOUNTER — Ambulatory Visit (INDEPENDENT_AMBULATORY_CARE_PROVIDER_SITE_OTHER): Payer: No Typology Code available for payment source | Admitting: Family Medicine

## 2022-10-26 ENCOUNTER — Encounter: Payer: Self-pay | Admitting: Family Medicine

## 2022-10-26 VITALS — BP 150/90 | HR 87 | Temp 98.7°F | Ht 71.0 in | Wt 242.6 lb

## 2022-10-26 DIAGNOSIS — R7303 Prediabetes: Secondary | ICD-10-CM | POA: Diagnosis not present

## 2022-10-26 DIAGNOSIS — I10 Essential (primary) hypertension: Secondary | ICD-10-CM

## 2022-10-26 DIAGNOSIS — E038 Other specified hypothyroidism: Secondary | ICD-10-CM | POA: Diagnosis not present

## 2022-10-26 DIAGNOSIS — E7800 Pure hypercholesterolemia, unspecified: Secondary | ICD-10-CM

## 2022-10-26 MED ORDER — METOCLOPRAMIDE HCL 10 MG PO TABS: 10.0000 mg | ORAL_TABLET | Freq: Two times a day (BID) | ORAL | 1 refills | Status: DC

## 2022-10-26 MED ORDER — PRAVASTATIN SODIUM 40 MG PO TABS
40.0000 mg | ORAL_TABLET | Freq: Every day | ORAL | 1 refills | Status: DC
Start: 2022-10-26 — End: 2023-05-18

## 2022-10-26 MED ORDER — IRBESARTAN-HYDROCHLOROTHIAZIDE 150-12.5 MG PO TABS: 2.0000 | ORAL_TABLET | Freq: Every day | ORAL | 1 refills | Status: DC

## 2022-10-26 MED ORDER — METOPROLOL SUCCINATE ER 50 MG PO TB24
ORAL_TABLET | ORAL | 0 refills | Status: DC
Start: 2022-10-26 — End: 2022-12-01

## 2022-10-26 NOTE — Progress Notes (Signed)
OFFICE VISIT  10/26/2022  CC:  Chief Complaint  Patient presents with   Medical Management of Chronic Issues    Pt is not fasting    Patient is a 47 y.o. male who presents for follow-up hypertension and hyperlipidemia.  INTERIM HX: Carlos James feels good.  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  No home blood pressure monitoring. He has stopped the excellent dieting he was doing and has gained 20 pounds since last visit. He says he felt too weak when he was 20 pounds lighter. No lower extremity swelling.  ROS as above, plus--> no fevers, no CP, no SOB, no wheezing, no cough, no dizziness, no HAs, no rashes, no melena/hematochezia.  No polyuria or polydipsia.  No myalgias or arthralgias.  No focal weakness, paresthesias, or tremors.  No acute vision or hearing abnormalities.  No dysuria or unusual/new urinary urgency or frequency.  No recent changes in lower legs. No n/v/d or abd pain.  No palpitations.     Past Medical History:  Diagnosis Date   Arthritis    Delayed gastric emptying 2007   per imaging in epic 08/ 2007   Edema of both lower extremities    GERD (gastroesophageal reflux disease)    History of precordial chest pain    evaluated by cardiologist, dr Excell Seltzer, for atypical cp lov note in epic 08-09-2011 pt released prn ;  normal nuclear stress test 07-27-2011 w/ ef 61%;  normal ETT 2008 per note   Hyperlipidemia    Intol of atorv, rosuva, lovalo, and zetia.  Good on pravastatin.   Hypertension    Insomnia    NASH (nonalcoholic steatohepatitis)    followed by pcp---  mild elev LFTs, viral hep serologies NEG.  Fatty liver on u/s 05/2020. Stable ultrasound 07/2021.   Pre-diabetes    Right inguinal hernia    Subclinical hypothyroidism 11/2020   11/2020->TSH very mildly elev, free T4 and T3 total NORMAL.   Umbilical hernia without obstruction and without gangrene     Past Surgical History:  Procedure Laterality Date   INGUINAL HERNIA REPAIR Right 02/26/2022   Procedure:  LAPAROSCOPIC RIGHT INGUINAL HERNIA REPAIR WITH MESH;  Surgeon: Stechschulte, Hyman Hopes, MD;  Location: [facility];  Service: General;  Laterality: Right;   NO PAST SURGERIES     UMBILICAL HERNIA REPAIR N/A 02/26/2022   Procedure: OPEN UMBILICAL HERNIA REPAIR WITH MESH;  Surgeon: Dossie Der Hyman Hopes, MD;  Location: [facility];  Service: General;  Laterality: N/A;    Outpatient Medications Prior to Visit  Medication Sig Dispense Refill   amLODipine (NORVASC) 10 MG tablet Take 1 tablet (10 mg total) by mouth daily. 90 tablet 0   esomeprazole (NEXIUM) 40 MG capsule Take 1 capsule (40 mg total) by mouth 2 (two) times daily. (Patient taking differently: Take 40 mg by mouth at bedtime.) 60 capsule 0   irbesartan-hydrochlorothiazide (AVALIDE) 150-12.5 MG tablet Take 2 tablets by mouth daily. MUST KEEP APPT FOR FURTHER REFILLS 60 tablet 0   metoCLOPramide (REGLAN) 10 MG tablet TAKE 1 TABLET BY MOUTH TWICE A DAY 180 tablet 1   metoprolol succinate (TOPROL-XL) 50 MG 24 hr tablet TAKE 1 TABLET BY MOUTH EVERY DAY WITH OR IMMEDIATELY FOLLOWING A MEAL 30 tablet 0   Multiple Vitamin (MULTIVITAMIN) tablet Take 1 tablet by mouth daily.     pravastatin (PRAVACHOL) 40 MG tablet TAKE 1 TABLET BY MOUTH EVERY DAY (Patient taking differently: Take 40 mg by mouth daily.) 90 tablet 3  furosemide (LASIX) 20 MG tablet Take 20 mg by mouth as needed.     oxyCODONE-acetaminophen (PERCOCET) 5-325 MG tablet Take 1 tablet by mouth every 4 (four) hours as needed for severe pain. 20 tablet 0   No facility-administered medications prior to visit.    Allergies  Allergen Reactions   Ciprofloxacin Nausea Only    Review of Systems As per HPI  PE:    10/26/2022    8:05 AM 02/26/2022    5:25 PM 02/26/2022    5:00 PM  Vitals with BMI  Height     Weight 242 lbs 10 oz    BMI 33.85    Systolic 157 147 161  Diastolic 90 92 80  Pulse 87 89 61  Initial bp today 157/90 Rpt bp today  150/90  Physical Exam  Gen: Alert, well appearing.  Patient is oriented to person, place, time, and situation. AFFECT: pleasant, lucid thought and speech. CV: RRR, no m/r/g.   LUNGS: CTA bilat, nonlabored resps, good aeration in all lung fields. EXT: no clubbing or cyanosis.  no edema.    LABS:  Last CBC Lab Results  Component Value Date   WBC 4.5 02/11/2022   HGB 13.9 02/26/2022   HCT 41.0 02/26/2022   MCV 89.5 02/11/2022   MCH 30.5 02/11/2022   RDW 13.1 02/11/2022   PLT 255 02/11/2022   Last metabolic panel Lab Results  Component Value Date   GLUCOSE 94 02/26/2022   NA 139 02/26/2022   K 3.7 02/26/2022   CL 101 02/26/2022   CO2 26 02/11/2022   BUN 8 02/26/2022   CREATININE 0.50 (L) 02/26/2022   CALCIUM 9.7 02/11/2022   PROT 7.9 02/11/2022   ALBUMIN 4.1 07/01/2021   BILITOT 0.7 02/11/2022   ALKPHOS 170 (H) 07/01/2021   AST 22 02/11/2022   ALT 24 02/11/2022   Last lipids Lab Results  Component Value Date   CHOL 175 02/11/2022   HDL 44 02/11/2022   LDLCALC 111 (H) 02/11/2022   LDLDIRECT 176.7 07/12/2013   TRIG 98 02/11/2022   CHOLHDL 4.0 02/11/2022   Last hemoglobin A1c Lab Results  Component Value Date   HGBA1C 5.1 02/11/2022   Last thyroid functions Lab Results  Component Value Date   TSH 2.64 02/11/2022   T3TOTAL 160 02/11/2022   IMPRESSION AND PLAN:  #1 uncontrolled hypertension.  Increase Toprol-XL to 100 mg a day (he will take 2 of his 50s daily until running out, at which time I'll eRx  tabs).  Continue amlodipine 10 mg a day and irbesartan/HCTZ 150/12.52 tablets daily.  2.  Hypercholesterolemia, has been doing well on Pravachol 40 mg daily. Lipid panel and hepatic panel today.  #3 prediabetes.  He had lost a lot of weight and A1c was down to 5.1% 8 months ago. Checking hemoglobin A1c today.  He has had some soda this morning so his glucose today will be nonfasting.  #4 subclinical hypothyroidism. Monitoring thyroid panel  today.  An After Visit Summary was printed and given to the patient.  FOLLOW UP: No follow-ups on file. Next cpe 4 mo  Signed:  Santiago Bumpers, MD           10/26/2022

## 2022-10-27 ENCOUNTER — Encounter: Payer: Self-pay | Admitting: Family Medicine

## 2022-10-27 LAB — LIPID PANEL
Cholesterol: 201 mg/dL — ABNORMAL HIGH (ref <200)
HDL: 88 mg/dL (ref >=40)
LDL Cholesterol (Calc): 100 mg/dL (calc) — ABNORMAL HIGH
Non-HDL Cholesterol (Calc): 113 mg/dL (calc) (ref <130)
Total CHOL/HDL Ratio: 2.3 (calc) (ref <5.0)
Triglycerides: 52 mg/dL (ref <150)

## 2022-10-27 LAB — COMPREHENSIVE METABOLIC PANEL
AG Ratio: 1.5 (calc) (ref 1.0–2.5)
ALT: 37 U/L (ref 9–46)
AST: 32 U/L (ref 10–40)
Albumin: 4.6 g/dL (ref 3.6–5.1)
Alkaline phosphatase (APISO): 133 U/L — ABNORMAL HIGH (ref 36–130)
BUN: 9 mg/dL (ref 7–25)
CO2: 26 mmol/L (ref 20–32)
Calcium: 9.2 mg/dL (ref 8.6–10.3)
Chloride: 100 mmol/L (ref 98–110)
Creat: 0.61 mg/dL (ref 0.60–1.29)
Globulin: 3 g/dL (calc) (ref 1.9–3.7)
Glucose, Bld: 117 mg/dL — ABNORMAL HIGH (ref 65–99)
Potassium: 4.3 mmol/L (ref 3.5–5.3)
Sodium: 138 mmol/L (ref 135–146)
Total Bilirubin: 0.8 mg/dL (ref 0.2–1.2)
Total Protein: 7.6 g/dL (ref 6.1–8.1)

## 2022-10-27 LAB — HEMOGLOBIN A1C
Hgb A1c MFr Bld: 5.7 % of total Hgb — ABNORMAL HIGH (ref <5.7)
Mean Plasma Glucose: 117 mg/dL
eAG (mmol/L): 6.5 mmol/L

## 2022-10-27 LAB — T3: T3, Total: 127 ng/dL (ref 76–181)

## 2022-10-27 LAB — T4, FREE: Free T4: 1.2 ng/dL (ref 0.8–1.8)

## 2022-10-27 LAB — TSH: TSH: 2.2 mIU/L (ref 0.40–4.50)

## 2022-12-01 ENCOUNTER — Other Ambulatory Visit: Payer: Self-pay | Admitting: Family Medicine

## 2022-12-01 ENCOUNTER — Other Ambulatory Visit: Payer: Self-pay

## 2022-12-01 ENCOUNTER — Telehealth: Payer: Self-pay | Admitting: Family Medicine

## 2022-12-01 MED ORDER — METOPROLOL SUCCINATE ER 100 MG PO TB24: ORAL_TABLET | ORAL | 1 refills | Status: DC

## 2022-12-01 NOTE — Telephone Encounter (Signed)
Rx sent 

## 2022-12-01 NOTE — Telephone Encounter (Signed)
Patient is in need of refill on   metoprolol succinate (TOPROL-XL) 50 MG 24 hr tablet    Pharmacy is [facility] in [place]. He reports that he has run out because he is now taking 2

## 2023-01-09 ENCOUNTER — Other Ambulatory Visit: Payer: Self-pay | Admitting: Family Medicine

## 2023-01-09 DIAGNOSIS — I10 Essential (primary) hypertension: Secondary | ICD-10-CM

## 2023-03-09 ENCOUNTER — Other Ambulatory Visit: Payer: Self-pay | Admitting: Family Medicine

## 2023-04-09 ENCOUNTER — Other Ambulatory Visit: Payer: Self-pay | Admitting: Family Medicine

## 2023-04-09 DIAGNOSIS — I10 Essential (primary) hypertension: Secondary | ICD-10-CM

## 2023-05-18 ENCOUNTER — Other Ambulatory Visit: Payer: Self-pay

## 2023-05-18 ENCOUNTER — Other Ambulatory Visit: Payer: Self-pay | Admitting: Family Medicine

## 2023-05-18 DIAGNOSIS — I10 Essential (primary) hypertension: Secondary | ICD-10-CM

## 2023-05-18 MED ORDER — ESOMEPRAZOLE MAGNESIUM 40 MG PO CPDR: 40.0000 mg | DELAYED_RELEASE_CAPSULE | Freq: Two times a day (BID) | ORAL | 0 refills | Status: DC

## 2023-05-18 MED ORDER — AMLODIPINE BESYLATE 10 MG PO TABS: 10.0000 mg | ORAL_TABLET | Freq: Every day | ORAL | 0 refills | Status: DC

## 2023-05-18 MED ORDER — IRBESARTAN-HYDROCHLOROTHIAZIDE 150-12.5 MG PO TABS: 2.0000 | ORAL_TABLET | Freq: Every day | ORAL | 1 refills | Status: DC

## 2023-05-18 MED ORDER — PRAVASTATIN SODIUM 40 MG PO TABS: 40.0000 mg | ORAL_TABLET | Freq: Every day | ORAL | 0 refills | Status: DC

## 2023-06-16 ENCOUNTER — Other Ambulatory Visit: Payer: Self-pay | Admitting: Family Medicine

## 2023-06-16 DIAGNOSIS — I10 Essential (primary) hypertension: Secondary | ICD-10-CM

## 2023-07-10 ENCOUNTER — Other Ambulatory Visit: Payer: Self-pay | Admitting: Family Medicine

## 2023-07-14 ENCOUNTER — Encounter: Payer: Self-pay | Admitting: Family Medicine

## 2023-07-14 ENCOUNTER — Ambulatory Visit (INDEPENDENT_AMBULATORY_CARE_PROVIDER_SITE_OTHER): Payer: No Typology Code available for payment source | Admitting: Family Medicine

## 2023-07-14 VITALS — BP 151/91 | HR 70 | Ht 71.0 in | Wt 248.4 lb

## 2023-07-14 DIAGNOSIS — Z0001 Encounter for general adult medical examination with abnormal findings: Secondary | ICD-10-CM | POA: Diagnosis not present

## 2023-07-14 DIAGNOSIS — Z1211 Encounter for screening for malignant neoplasm of colon: Secondary | ICD-10-CM

## 2023-07-14 DIAGNOSIS — M25561 Pain in right knee: Secondary | ICD-10-CM

## 2023-07-14 DIAGNOSIS — Z0000 Encounter for general adult medical examination without abnormal findings: Secondary | ICD-10-CM

## 2023-07-14 DIAGNOSIS — R748 Abnormal levels of other serum enzymes: Secondary | ICD-10-CM

## 2023-07-14 DIAGNOSIS — M170 Bilateral primary osteoarthritis of knee: Secondary | ICD-10-CM

## 2023-07-14 DIAGNOSIS — E7800 Pure hypercholesterolemia, unspecified: Secondary | ICD-10-CM | POA: Diagnosis not present

## 2023-07-14 DIAGNOSIS — I10 Essential (primary) hypertension: Secondary | ICD-10-CM

## 2023-07-14 DIAGNOSIS — M25562 Pain in left knee: Secondary | ICD-10-CM

## 2023-07-14 DIAGNOSIS — G8929 Other chronic pain: Secondary | ICD-10-CM

## 2023-07-14 LAB — LIPID PANEL
Cholesterol: 223 mg/dL — ABNORMAL HIGH (ref 0–200)
HDL: 68.4 mg/dL (ref >39.00)
LDL Cholesterol: 144 mg/dL — ABNORMAL HIGH (ref 0–99)
NonHDL: 154.86
Total CHOL/HDL Ratio: 3
Triglycerides: 53 mg/dL (ref 0.0–149.0)
VLDL: 10.6 mg/dL (ref 0.0–40.0)

## 2023-07-14 LAB — COMPREHENSIVE METABOLIC PANEL
ALT: 43 U/L (ref 0–53)
AST: 39 U/L — ABNORMAL HIGH (ref 0–37)
Albumin: 4.9 g/dL (ref 3.5–5.2)
Alkaline Phosphatase: 135 U/L — ABNORMAL HIGH (ref 39–117)
BUN: 15 mg/dL (ref 6–23)
CO2: 29 meq/L (ref 19–32)
Calcium: 9.6 mg/dL (ref 8.4–10.5)
Chloride: 98 meq/L (ref 96–112)
Creatinine, Ser: 0.75 mg/dL (ref 0.40–1.50)
GFR: 107.01 mL/min (ref >60.00)
Glucose, Bld: 112 mg/dL — ABNORMAL HIGH (ref 70–99)
Potassium: 4.4 meq/L (ref 3.5–5.1)
Sodium: 136 meq/L (ref 135–145)
Total Bilirubin: 0.8 mg/dL (ref 0.2–1.2)
Total Protein: 8.1 g/dL (ref 6.0–8.3)

## 2023-07-14 LAB — CBC
HCT: 43.4 % (ref 39.0–52.0)
Hemoglobin: 14.7 g/dL (ref 13.0–17.0)
MCHC: 33.9 g/dL (ref 30.0–36.0)
MCV: 93.7 fL (ref 78.0–100.0)
Platelets: 230 10*3/uL (ref 150.0–400.0)
RBC: 4.64 Mil/uL (ref 4.22–5.81)
RDW: 12.3 % (ref 11.5–15.5)
WBC: 3.8 10*3/uL — ABNORMAL LOW (ref 4.0–10.5)

## 2023-07-14 MED ORDER — METOCLOPRAMIDE HCL 10 MG PO TABS: 10.0000 mg | ORAL_TABLET | Freq: Two times a day (BID) | ORAL | 1 refills | Status: DC

## 2023-07-14 MED ORDER — ESOMEPRAZOLE MAGNESIUM 40 MG PO CPDR: 40.0000 mg | DELAYED_RELEASE_CAPSULE | Freq: Two times a day (BID) | ORAL | 1 refills | Status: DC

## 2023-07-14 MED ORDER — METOPROLOL SUCCINATE ER 100 MG PO TB24: ORAL_TABLET | ORAL | 3 refills | Status: DC

## 2023-07-14 NOTE — Progress Notes (Signed)
 Office Note 07/14/2023  CC:  Chief Complaint  Patient presents with   Annual Exam    Pt is fasting.    Patient is a 48 y.o. male who is here for annual health maintenance exam and 48-month follow-up hypertension and hypercholesterolemia. A/P as of last visit: #1 uncontrolled hypertension.  Increase Toprol -XL to 100 mg a day (he will take 2 of his 50s daily until running out, at which time I'll eRx 100mg  tabs).  Continue amlodipine  10 mg a day and irbesartan /HCTZ 150/12.5 2 tablets daily.   2.  Hypercholesterolemia, has been doing well on Pravachol  40 mg daily. Lipid panel and hepatic panel today.   #3 prediabetes.  He had lost a lot of weight and A1c was down to 5.1% 8 months ago. Checking hemoglobin A1c today.  He has had some soda this morning so his glucose today will be nonfasting.   #4 subclinical hypothyroidism. Monitoring thyroid  panel today.  INTERIM HX: Redell feels well. He admits his diet has not been very strict over the last couple of months.  His knees have been bothering him more last several months.  They tend to hurt around the joint line and in the back.  They feel full sometimes and make it hard for him to fully flex his knees/squat.  He spends all day on hard surfaces.  He recalls being told he had torn meniscus in both knee a long time ago.  No knee procedures have been done other than an aspiration and injection at 1 point in the past.   No locking up or buckling.  No redness.  He occasionally sees some swelling in both knees particular towards the end of the day.   Past Medical History:  Diagnosis Date   Arthritis    Delayed gastric emptying 2007   per imaging in epic 08/ 2007   Edema of both lower extremities    GERD (gastroesophageal reflux disease)    History of precordial chest pain    evaluated by cardiologist, dr wonda, for atypical cp lov note in epic 08-09-2011 pt released prn ;  normal nuclear stress test 07-27-2011 w/ ef 61%;  normal ETT 2008  per note   Hyperlipidemia    Intol of atorv, rosuva, lovalo, and zetia .  Good on pravastatin .   Hypertension    Insomnia    NASH (nonalcoholic steatohepatitis)    followed by pcp---  mild elev LFTs, viral hep serologies NEG.  Fatty liver on u/s 05/2020. Stable ultrasound 07/2021.   Pre-diabetes    Right inguinal hernia    Subclinical hypothyroidism 11/2020   11/2020->TSH very mildly elev, free T4 and T3 total NORMAL.   Umbilical hernia without obstruction and without gangrene     Past Surgical History:  Procedure Laterality Date   INGUINAL HERNIA REPAIR Right 02/26/2022   Procedure: LAPAROSCOPIC RIGHT INGUINAL HERNIA REPAIR WITH MESH;  Surgeon: Stechschulte, Deward PARAS, MD;  Location: [facility];  Service: General;  Laterality: Right;   UMBILICAL HERNIA REPAIR N/A 02/26/2022   Procedure: OPEN UMBILICAL HERNIA REPAIR WITH MESH;  Surgeon: Stechschulte, Deward PARAS, MD;  Location: [facility];  Service: General;  Laterality: N/A;    Family History  Problem Relation Age of Onset   Coronary artery disease Other    Arthritis Mother    Heart disease Mother    Hyperlipidemia Mother    Arthritis Father    Heart disease Father    Hyperlipidemia Father     Social History  Socioeconomic History   Marital status: Married    Spouse name: Not on file   Number of children: Not on file   Years of education: Not on file   Highest education level: 12th grade  Occupational History   Not on file  Tobacco Use   Smoking status: Former    Current packs/day: 0.00    Types: Cigarettes    Start date: 2002    Quit date: 2003    Years since quitting: 22.0   Smokeless tobacco: Current    Types: Snuff  Vaping Use   Vaping status: Never Used  Substance and Sexual Activity   Alcohol use: Yes    Comment: rare   Drug use: Never   Sexual activity: Yes  Other Topics Concern   Not on file  Social History Narrative   Married, 2 chldren.   Occupation: Owns a research officer, political party.   Orig from [place].   He dips.  No smoking.  Alcohol intake: 2-3 beers a day on his days off.            Social Drivers of Corporate Investment Banker Strain: Low Risk  (02/10/2022)   Overall Financial Resource Strain (CARDIA)    Difficulty of Paying Living Expenses: Not hard at all  Food Insecurity: No Food Insecurity (02/10/2022)   Hunger Vital Sign    Worried About Running Out of Food in the Last Year: Never true    Ran Out of Food in the Last Year: Never true  Transportation Needs: No Transportation Needs (02/10/2022)   PRAPARE - Administrator, Civil Service (Medical): No    Lack of Transportation (Non-Medical): No  Physical Activity: Unknown (02/10/2022)   Exercise Vital Sign    Days of Exercise per Week: Patient declined    Minutes of Exercise per Session: Not on file  Stress: No Stress Concern Present (02/10/2022)   [employer] of Occupational Health - Occupational Stress Questionnaire    Feeling of Stress : Not at all  Social Connections: Unknown (02/10/2022)   Social Connection and Isolation Panel [NHANES]    Frequency of Communication with Friends and Family: More than three times a week    Frequency of Social Gatherings with Friends and Family: Once a week    Attends Religious Services: Patient declined    Database Administrator or Organizations: No    Attends Engineer, Structural: Not on file    Marital Status: Married  Catering Manager Violence: Not on file    Outpatient Medications Prior to Visit  Medication Sig Dispense Refill   amLODipine  (NORVASC ) 10 MG tablet Take 1 tablet (10 mg total) by mouth daily. 90 tablet 0   esomeprazole  (NEXIUM ) 40 MG capsule Take 1 capsule (40 mg total) by mouth 2 (two) times daily. 60 capsule 0   irbesartan -hydrochlorothiazide  (AVALIDE) 150-12.5 MG tablet Take 2 tablets by mouth daily. 180 tablet 1   metoCLOPramide  (REGLAN ) 10 MG tablet Take 1 tablet (10 mg total) by mouth 2 (two) times daily. 180  tablet 1   metoprolol  succinate (TOPROL -XL) 100 MG 24 hr tablet TAKE 1 TABLET WITH OR IMMEDIATELY FOLLOWING A MEAL. 30 tablet 0   Multiple Vitamin (MULTIVITAMIN) tablet Take 1 tablet by mouth daily.     pravastatin  (PRAVACHOL ) 40 MG tablet Take 1 tablet (40 mg total) by mouth daily. 90 tablet 0   No facility-administered medications prior to visit.    Allergies  Allergen Reactions   Ciprofloxacin Nausea Only  Review of Systems  Constitutional:  Negative for appetite change, chills, fatigue and fever.  HENT:  Negative for congestion, dental problem, ear pain and sore throat.   Eyes:  Negative for discharge, redness and visual disturbance.  Respiratory:  Negative for cough, chest tightness, shortness of breath and wheezing.   Cardiovascular:  Negative for chest pain, palpitations and leg swelling.  Gastrointestinal:  Negative for abdominal pain, blood in stool, diarrhea, nausea and vomiting.  Genitourinary:  Negative for difficulty urinating, dysuria, flank pain, frequency, hematuria and urgency.  Musculoskeletal:  Positive for arthralgias (bilat knees). Negative for back pain, joint swelling, myalgias and neck stiffness.  Skin:  Negative for pallor and rash.  Neurological:  Negative for dizziness, speech difficulty, weakness and headaches.  Hematological:  Negative for adenopathy. Does not bruise/bleed easily.  Psychiatric/Behavioral:  Negative for confusion and sleep disturbance. The patient is not nervous/anxious.     PE;    07/14/2023    8:25 AM 10/26/2022    8:34 AM 10/26/2022    8:05 AM  Vitals with BMI  Height 5' 11  5' 11  Weight 248 lbs 6 oz  242 lbs 10 oz  BMI 34.66  33.85  Systolic 151 150 842  Diastolic 91 90 90  Pulse 70  87   Gen: Alert, well appearing.  Patient is oriented to person, place, time, and situation. AFFECT: pleasant, lucid thought and speech. ENT: Ears: EACs clear, normal epithelium.  TMs with good light reflex and landmarks bilaterally.  Eyes: no  injection, icteris, swelling, or exudate.  EOMI, PERRLA. Nose: no drainage or turbinate edema/swelling.  No injection or focal lesion.  Mouth: lips without lesion/swelling.  Oral mucosa pink and moist.  Dentition intact and without obvious caries or gingival swelling.  Oropharynx without erythema, exudate, or swelling.  Neck: supple/nontender.  No LAD, mass, or TM.  Carotid pulses 2+ bilaterally, without bruits. CV: RRR, no m/r/g.   LUNGS: CTA bilat, nonlabored resps, good aeration in all lung fields. ABD: soft, NT, ND, BS normal.  No hepatospenomegaly or mass.  No bruits. EXT: no clubbing, cyanosis, or edema.  Musculoskeletal: Knees are without any erythema, warmth, or swelling/effusion. Range of motion is fully intact passively and actively.  No ligamentous instability.  He has a mild amount of tenderness over the lateral joint line on the left knee.  Skin - no sores or suspicious lesions or rashes or color changes  Pertinent labs:  Lab Results  Component Value Date   TSH 2.20 10/26/2022   Lab Results  Component Value Date   WBC 4.5 02/11/2022   HGB 13.9 02/26/2022   HCT 41.0 02/26/2022   MCV 89.5 02/11/2022   PLT 255 02/11/2022   Lab Results  Component Value Date   CREATININE 0.61 10/26/2022   BUN 9 10/26/2022   NA 138 10/26/2022   K 4.3 10/26/2022   CL 100 10/26/2022   CO2 26 10/26/2022   Lab Results  Component Value Date   ALT 37 10/26/2022   AST 32 10/26/2022   ALKPHOS 170 (H) 07/01/2021   BILITOT 0.8 10/26/2022   Lab Results  Component Value Date   CHOL 201 (H) 10/26/2022   Lab Results  Component Value Date   HDL 88 10/26/2022   Lab Results  Component Value Date   LDLCALC 100 (H) 10/26/2022   Lab Results  Component Value Date   TRIG 52 10/26/2022   Lab Results  Component Value Date   CHOLHDL 2.3 10/26/2022   Lab  Results  Component Value Date   HGBA1C 5.7 (H) 10/26/2022   ASSESSMENT AND PLAN:   #1 health maintenance exam: Reviewed age and  gender appropriate health maintenance issues (prudent diet, regular exercise, health risks of tobacco and excessive alcohol, use of seatbelts, fire alarms in home, use of sunscreen).  Also reviewed age and gender appropriate health screening as well as vaccine recommendations. Vaccines: Flu->declined.  Otherwise UTD. Labs: CBC, c-Met, lipid panel Prostate ca screening: average risk patient= as per latest guidelines, start screening at 51 yrs of age. Colon ca screening: iFOB was negative in 2022.  Cologuard ordered today.  #2 essential hypertension, stable on amlodipine  10 mg a day, irbesartan -HCTZ 150-12.5, 2 tabs daily, and Toprol -XL 100 mg daily. Check electrolytes and creatinine today.  3.  Hypercholesterolemia.  Doing well on Pravachol  40 mg a day. Lipid panel and hepatic panel today.  #4 bilateral knee pain.  He has some degenerative arthritis.  Discussed ways to try to minimize his pain/decrease progression of the problem. Discussed the role of steroid injections as part of future management if he needs it.  He expressed apprehension about injections because of a painful one in the past.  An After Visit Summary was printed and given to the patient.  FOLLOW UP:  No follow-ups on file.  Signed:  Gerlene Hockey, MD           07/14/2023

## 2023-07-14 NOTE — Patient Instructions (Signed)
 Health Maintenance, Male  Adopting a healthy lifestyle and getting preventive care are important in promoting health and wellness. Ask your health care provider about:  The right schedule for you to have regular tests and exams.  Things you can do on your own to prevent diseases and keep yourself healthy.  What should I know about diet, weight, and exercise?  Eat a healthy diet    Eat a diet that includes plenty of vegetables, fruits, low-fat dairy products, and lean protein.  Do not eat a lot of foods that are high in solid fats, added sugars, or sodium.  Maintain a healthy weight  Body mass index (BMI) is a measurement that can be used to identify possible weight problems. It estimates body fat based on height and weight. Your health care provider can help determine your BMI and help you achieve or maintain a healthy weight.  Get regular exercise  Get regular exercise. This is one of the most important things you can do for your health. Most adults should:  Exercise for at least 150 minutes each week. The exercise should increase your heart rate and make you sweat (moderate-intensity exercise).  Do strengthening exercises at least twice a week. This is in addition to the moderate-intensity exercise.  Spend less time sitting. Even light physical activity can be beneficial.  Watch cholesterol and blood lipids  Have your blood tested for lipids and cholesterol at 49 years of age, then have this test every 5 years.  You may need to have your cholesterol levels checked more often if:  Your lipid or cholesterol levels are high.  You are older than 49 years of age.  You are at high risk for heart disease.  What should I know about cancer screening?  Many types of cancers can be detected early and may often be prevented. Depending on your health history and family history, you may need to have cancer screening at various ages. This may include screening for:  Colorectal cancer.  Prostate cancer.  Skin cancer.  Lung  cancer.  What should I know about heart disease, diabetes, and high blood pressure?  Blood pressure and heart disease  High blood pressure causes heart disease and increases the risk of stroke. This is more likely to develop in people who have high blood pressure readings or are overweight.  Talk with your health care provider about your target blood pressure readings.  Have your blood pressure checked:  Every 3-5 years if you are 24-52 years of age.  Every year if you are 3 years old or older.  If you are between the ages of 60 and 72 and are a current or former smoker, ask your health care provider if you should have a one-time screening for abdominal aortic aneurysm (AAA).  Diabetes  Have regular diabetes screenings. This checks your fasting blood sugar level. Have the screening done:  Once every three years after age 66 if you are at a normal weight and have a low risk for diabetes.  More often and at a younger age if you are overweight or have a high risk for diabetes.  What should I know about preventing infection?  Hepatitis B  If you have a higher risk for hepatitis B, you should be screened for this virus. Talk with your health care provider to find out if you are at risk for hepatitis B infection.  Hepatitis C  Blood testing is recommended for:  Everyone born from [birth year] through 1966.  Anyone  with known risk factors for hepatitis C.  Sexually transmitted infections (STIs)  You should be screened each year for STIs, including gonorrhea and chlamydia, if:  You are sexually active and are younger than 49 years of age.  You are older than 49 years of age and your health care provider tells you that you are at risk for this type of infection.  Your sexual activity has changed since you were last screened, and you are at increased risk for chlamydia or gonorrhea. Ask your health care provider if you are at risk.  Ask your health care provider about whether you are at high risk for HIV. Your health care provider  may recommend a prescription medicine to help prevent HIV infection. If you choose to take medicine to prevent HIV, you should first get tested for HIV. You should then be tested every 3 months for as long as you are taking the medicine.  Follow these instructions at home:  Alcohol use  Do not drink alcohol if your health care provider tells you not to drink.  If you drink alcohol:  Limit how much you have to 0-2 drinks a day.  Know how much alcohol is in your drink. In the [place] one drink equals one 12 oz bottle of beer (355 mL), one 5 oz glass of wine (148 mL), or one 1 oz glass of hard liquor (44 mL).  Lifestyle  Do not use any products that contain nicotine or tobacco. These products include cigarettes, chewing tobacco, and vaping devices, such as e-cigarettes. If you need help quitting, ask your health care provider.  Do not use street drugs.  Do not share needles.  Ask your health care provider for help if you need support or information about quitting drugs.  General instructions  Schedule regular health, dental, and eye exams.  Stay current with your vaccines.  Tell your health care provider if:  You often feel depressed.  You have ever been abused or do not feel safe at home.  Summary  Adopting a healthy lifestyle and getting preventive care are important in promoting health and wellness.  Follow your health care provider's instructions about healthy diet, exercising, and getting tested or screened for diseases.  Follow your health care provider's instructions on monitoring your cholesterol and blood pressure.  This information is not intended to replace advice given to you by your health care provider. Make sure you discuss any questions you have with your health care provider.  Document Revised: 11/10/2020 Document Reviewed: 11/10/2020  Elsevier Patient Education  2024 [employer].

## 2023-07-15 ENCOUNTER — Ambulatory Visit (INDEPENDENT_AMBULATORY_CARE_PROVIDER_SITE_OTHER): Payer: No Typology Code available for payment source

## 2023-07-15 DIAGNOSIS — R748 Abnormal levels of other serum enzymes: Secondary | ICD-10-CM

## 2023-07-15 LAB — GAMMA GT: GGT: 386 U/L — ABNORMAL HIGH (ref 7–51)

## 2023-07-15 NOTE — Addendum Note (Signed)
 Addended by: Daron Offer A on: 07/15/2023 09:21 AM   Modules accepted: Orders

## 2023-07-18 ENCOUNTER — Other Ambulatory Visit: Payer: Self-pay | Admitting: Family Medicine

## 2023-08-30 ENCOUNTER — Other Ambulatory Visit: Payer: Self-pay | Admitting: Family Medicine

## 2023-08-30 DIAGNOSIS — I10 Essential (primary) hypertension: Secondary | ICD-10-CM

## 2023-10-09 ENCOUNTER — Other Ambulatory Visit: Payer: Self-pay | Admitting: Family Medicine

## 2023-10-10 ENCOUNTER — Other Ambulatory Visit: Payer: Self-pay

## 2023-10-10 DIAGNOSIS — Z1211 Encounter for screening for malignant neoplasm of colon: Secondary | ICD-10-CM

## 2023-10-15 ENCOUNTER — Other Ambulatory Visit: Payer: Self-pay | Admitting: Family Medicine

## 2023-10-15 DIAGNOSIS — I10 Essential (primary) hypertension: Secondary | ICD-10-CM

## 2023-11-02 LAB — COLOGUARD: COLOGUARD: NEGATIVE

## 2023-11-03 ENCOUNTER — Encounter: Payer: Self-pay | Admitting: Family Medicine

## 2023-11-07 ENCOUNTER — Encounter: Payer: Self-pay | Admitting: Family Medicine

## 2023-11-13 ENCOUNTER — Other Ambulatory Visit: Payer: Self-pay | Admitting: Family Medicine

## 2023-11-16 NOTE — Telephone Encounter (Signed)
 No further action needed.

## 2024-01-08 ENCOUNTER — Other Ambulatory Visit: Payer: Self-pay | Admitting: Family Medicine

## 2024-01-17 ENCOUNTER — Other Ambulatory Visit: Payer: Self-pay | Admitting: Family Medicine

## 2024-01-17 DIAGNOSIS — I10 Essential (primary) hypertension: Secondary | ICD-10-CM

## 2024-02-13 ENCOUNTER — Other Ambulatory Visit: Payer: Self-pay | Admitting: Family Medicine

## 2024-02-14 ENCOUNTER — Other Ambulatory Visit: Payer: Self-pay | Admitting: Family Medicine

## 2024-02-17 ENCOUNTER — Other Ambulatory Visit: Payer: Self-pay | Admitting: Family Medicine

## 2024-02-29 ENCOUNTER — Other Ambulatory Visit: Payer: Self-pay

## 2024-02-29 MED ORDER — PRAVASTATIN SODIUM 40 MG PO TABS: 40.0000 mg | ORAL_TABLET | Freq: Every day | ORAL | 0 refills | Status: DC

## 2024-02-29 MED ORDER — IRBESARTAN-HYDROCHLOROTHIAZIDE 150-12.5 MG PO TABS
2.0000 | ORAL_TABLET | Freq: Every day | ORAL | 0 refills | Status: DC
Start: 2024-02-29 — End: 2024-03-12

## 2024-03-13 ENCOUNTER — Ambulatory Visit (INDEPENDENT_AMBULATORY_CARE_PROVIDER_SITE_OTHER): Admitting: Family Medicine

## 2024-03-13 ENCOUNTER — Encounter: Payer: Self-pay | Admitting: Family Medicine

## 2024-03-13 VITALS — BP 150/98 | HR 79 | Temp 98.9°F | Ht 71.0 in | Wt 258.8 lb

## 2024-03-13 DIAGNOSIS — R748 Abnormal levels of other serum enzymes: Secondary | ICD-10-CM

## 2024-03-13 DIAGNOSIS — R7303 Prediabetes: Secondary | ICD-10-CM | POA: Diagnosis not present

## 2024-03-13 DIAGNOSIS — E7800 Pure hypercholesterolemia, unspecified: Secondary | ICD-10-CM | POA: Diagnosis not present

## 2024-03-13 DIAGNOSIS — I10 Essential (primary) hypertension: Secondary | ICD-10-CM | POA: Diagnosis not present

## 2024-03-13 LAB — HEMOGLOBIN A1C: Hgb A1c MFr Bld: 6 % (ref 4.6–6.5)

## 2024-03-13 LAB — GAMMA GT: GGT: 355 U/L — ABNORMAL HIGH (ref 7–51)

## 2024-03-13 MED ORDER — METOCLOPRAMIDE HCL 10 MG PO TABS: 10.0000 mg | ORAL_TABLET | Freq: Two times a day (BID) | ORAL | 1 refills | Status: AC

## 2024-03-13 MED ORDER — IRBESARTAN-HYDROCHLOROTHIAZIDE 150-12.5 MG PO TABS: 2.0000 | ORAL_TABLET | Freq: Every day | ORAL | 3 refills | Status: AC

## 2024-03-13 MED ORDER — METOPROLOL SUCCINATE ER 100 MG PO TB24: ORAL_TABLET | ORAL | Status: DC

## 2024-03-13 MED ORDER — PRAVASTATIN SODIUM 40 MG PO TABS: 40.0000 mg | ORAL_TABLET | Freq: Every day | ORAL | 1 refills | Status: AC

## 2024-03-13 MED ORDER — ESOMEPRAZOLE MAGNESIUM 40 MG PO CPDR: 40.0000 mg | DELAYED_RELEASE_CAPSULE | Freq: Two times a day (BID) | ORAL | 1 refills | Status: AC

## 2024-03-13 NOTE — Progress Notes (Signed)
 OFFICE VISIT  03/13/2024  CC:  Chief Complaint  Patient presents with   Medical Management of Chronic Issues    Pt is fasting    Patient is a 48 y.o. male who presents for 32-month follow-up hypertension, hyperlipidemia, prediabetes. A/P as of last visit: #1 essential hypertension, stable on amlodipine  10 mg a day, irbesartan -HCTZ 150-12.5, 2 tabs daily, and Toprol -XL 100 mg daily. Check electrolytes and creatinine today.   2.  Hypercholesterolemia.  Doing well on Pravachol  40 mg a day. Lipid panel and hepatic panel today.   #3 bilateral knee pain.  He has some degenerative arthritis.  Discussed ways to try to minimize his pain/decrease progression of the problem. Discussed the role of steroid injections as part of future management if he needs it.  He expressed apprehension about injections because of a painful one in the past.  INTERIM HX: Has gained weight back.  Lifestyle not so good. Drinking beer again, eating high sodium diet, not exercising. He says he feels better than when he had lost weight!  No home blood pressure monitoring. His knees hurt, mainly on the sides and in the back.  He does not see any swelling.  When he sits a long time and then gets up that is when they hurt the worst.  As he moves about through his day it lessens some. He takes ibuprofen most days, 1 dose in the morning.  He also will try Tylenol  and it works about as good. He does not have any other joint pain.  No muscle pain.  No fatigue.  ROS as above, plus--> no fevers, no CP, no SOB, no wheezing, no cough, no dizziness, no HAs, no rashes, no melena/hematochezia.  No polyuria or polydipsia.   No focal weakness, paresthesias, or tremors.  No acute vision or hearing abnormalities.  No dysuria or unusual/new urinary urgency or frequency.  No recent changes in lower legs. No n/v/d or abd pain.  No palpitations.       Past Medical History:  Diagnosis Date   Arthritis    Colon cancer screening     Cologuard NEG 10/2023   Delayed gastric emptying 2007   per imaging in epic 08/ 2007   Edema of both lower extremities    GERD (gastroesophageal reflux disease)    History of precordial chest pain    evaluated by cardiologist, dr wonda, for atypical cp lov note in epic 08-09-2011 pt released prn ;  normal nuclear stress test 07-27-2011 w/ ef 61%;  normal ETT 2008 per note   Hyperlipidemia    Intol of atorv, rosuva, lovalo, and zetia .  Good on pravastatin .   Hypertension    Insomnia    NASH (nonalcoholic steatohepatitis)    followed by pcp---  mild elev LFTs, viral hep serologies NEG.  Fatty liver on u/s 05/2020. Stable ultrasound 07/2021.   Pre-diabetes    Right inguinal hernia    Subclinical hypothyroidism 11/2020   11/2020->TSH very mildly elev, free T4 and T3 total NORMAL.   Umbilical hernia without obstruction and without gangrene     Past Surgical History:  Procedure Laterality Date   INGUINAL HERNIA REPAIR Right 02/26/2022   Procedure: LAPAROSCOPIC RIGHT INGUINAL HERNIA REPAIR WITH MESH;  Surgeon: Stechschulte, Deward PARAS, MD;  Location: [facility];  Service: General;  Laterality: Right;   UMBILICAL HERNIA REPAIR N/A 02/26/2022   Procedure: OPEN UMBILICAL HERNIA REPAIR WITH MESH;  Surgeon: Stechschulte, Deward PARAS, MD;  Location: [facility];  Service:  General;  Laterality: N/A;    Outpatient Medications Prior to Visit  Medication Sig Dispense Refill   amLODipine  (NORVASC ) 10 MG tablet TAKE 1 TABLET BY MOUTH EVERY DAY 90 tablet 0   Multiple Vitamin (MULTIVITAMIN) tablet Take 1 tablet by mouth daily.     metoprolol  succinate (TOPROL -XL) 100 MG 24 hr tablet Take 1 tablet with or immediately following a meal. 90 tablet 3   esomeprazole  (NEXIUM ) 40 MG capsule Take 1 capsule (40 mg total) by mouth 2 (two) times daily. 180 capsule 1   irbesartan -hydrochlorothiazide  (AVALIDE) 150-12.5 MG tablet Take 2 tablets by mouth daily. 60 tablet 0   metoCLOPramide  (REGLAN )  10 MG tablet Take 1 tablet (10 mg total) by mouth 2 (two) times daily. 180 tablet 1   pravastatin  (PRAVACHOL ) 40 MG tablet Take 1 tablet (40 mg total) by mouth daily. 30 tablet 0   No facility-administered medications prior to visit.    Allergies  Allergen Reactions   Ciprofloxacin Nausea Only    Review of Systems As per HPI  PE:    03/13/2024    8:20 AM 03/13/2024    8:01 AM 07/14/2023    8:25 AM  Vitals with BMI  Height  5' 11 5' 11  Weight  258 lbs 13 oz 248 lbs 6 oz  BMI  36.11 34.66  Systolic 150 154 848  Diastolic 98 91 91  Pulse  79 70     Physical Exam  Gen: Alert, well appearing.  Patient is oriented to person, place, time, and situation. AFFECT: pleasant, lucid thought and speech. Cardiovascular: Regular rhythm and rate, soft systolic ejection murmur, no diastolic murmur. Lungs are clear bilaterally, breathing nonlabored. Extremities: No edema. Knees without erythema, warmth, or swelling. He has full range of motion and no instability. He has some tenderness to palpation around the medial and lateral joint line of the right knee and no significant tenderness anywhere on the left knee. No popliteal tenderness or mass  LABS:  Last CBC Lab Results  Component Value Date   WBC 3.8 (L) 07/14/2023   HGB 14.7 07/14/2023   HCT 43.4 07/14/2023   MCV 93.7 07/14/2023   MCH 30.5 02/11/2022   RDW 12.3 07/14/2023   PLT 230.0 07/14/2023   Last metabolic panel Lab Results  Component Value Date   GLUCOSE 112 (H) 07/14/2023   NA 136 07/14/2023   K 4.4 07/14/2023   CL 98 07/14/2023   CO2 29 07/14/2023   BUN 15 07/14/2023   CREATININE 0.75 07/14/2023   GFR 107.01 07/14/2023   CALCIUM  9.6 07/14/2023   PROT 8.1 07/14/2023   ALBUMIN 4.9 07/14/2023   BILITOT 0.8 07/14/2023   ALKPHOS 135 (H) 07/14/2023   AST 39 (H) 07/14/2023   ALT 43 07/14/2023   Last lipids Lab Results  Component Value Date   CHOL 223 (H) 07/14/2023   HDL 68.40 07/14/2023   LDLCALC 144 (H)  07/14/2023   LDLDIRECT 176.7 07/12/2013   TRIG 53.0 07/14/2023   CHOLHDL 3 07/14/2023   Last hemoglobin A1c Lab Results  Component Value Date   HGBA1C 5.7 (H) 10/26/2022   Last thyroid  functions Lab Results  Component Value Date   TSH 2.20 10/26/2022   T3TOTAL 127 10/26/2022   IMPRESSION AND PLAN:  #1 hypertension, poor control. He says he will start working hard on lowering sodium, decreasing beer, and losing weight. We will increase Toprol -XL to 1 tab twice a day and he will start monitoring blood pressure at home and  bring these in for review in 2 weeks.  Continue amlodipine  10 mg a day and irbesartan -HCTZ 150-12.5, 2 tabs daily Electrolytes and creatinine today.  #2 hypercholesterolemia, doing well on pravastatin  40 mg a day. Goal LDL less than 100. Lipid panel and hepatic panel today.  3.  Hepatic steatosis.  History of mild elevation of LFTs and alk phos. Avoid NSAIDs and Tylenol  the best he can. Repeat hepatic panel today. Of note, right upper quadrant limited ultrasound January 2023 showed stable echogenic liver likely related to the diffuse hepatocellular disease such as fatty infiltration. Consider repeat in near future.  4.  Bilateral knee pain.  Suspect osteoarthritis.  He says he has a history of meniscus tears as well. He is hesitant about seeing orthopedics.  He declined x-rays today.  He is not interested in injections. We have to minimize NSAIDs and Tylenol  is much as we can given #1 and #3 above. For now he will work on weight loss.  An After Visit Summary was printed and given to the patient.  FOLLOW UP: Return in about 2 weeks (around 03/27/2024) for Follow-up hypertension.  Signed:  Gerlene Hockey, MD           03/13/2024

## 2024-03-14 ENCOUNTER — Ambulatory Visit: Payer: Self-pay | Admitting: Family Medicine

## 2024-03-14 LAB — COMPREHENSIVE METABOLIC PANEL WITH GFR
AG Ratio: 1.4 (calc) (ref 1.0–2.5)
ALT: 43 U/L (ref 9–46)
AST: 36 U/L (ref 10–40)
Albumin: 4.6 g/dL (ref 3.6–5.1)
Alkaline phosphatase (APISO): 127 U/L (ref 36–130)
BUN: 9 mg/dL (ref 7–25)
CO2: 29 mmol/L (ref 20–32)
Calcium: 9.9 mg/dL (ref 8.6–10.3)
Chloride: 99 mmol/L (ref 98–110)
Creat: 0.66 mg/dL (ref 0.60–1.29)
Globulin: 3.2 g/dL (ref 1.9–3.7)
Glucose, Bld: 113 mg/dL — ABNORMAL HIGH (ref 65–99)
Potassium: 4.8 mmol/L (ref 3.5–5.3)
Sodium: 137 mmol/L (ref 135–146)
Total Bilirubin: 0.7 mg/dL (ref 0.2–1.2)
Total Protein: 7.8 g/dL (ref 6.1–8.1)
eGFR: 116 mL/min/1.73m2 (ref >=60)

## 2024-03-14 LAB — LIPID PANEL
Cholesterol: 204 mg/dL — ABNORMAL HIGH (ref <200)
HDL: 72 mg/dL (ref >=40)
LDL Cholesterol (Calc): 117 mg/dL — ABNORMAL HIGH
Non-HDL Cholesterol (Calc): 132 mg/dL — ABNORMAL HIGH (ref <130)
Total CHOL/HDL Ratio: 2.8 (calc) (ref <5.0)
Triglycerides: 60 mg/dL (ref <150)

## 2024-03-29 ENCOUNTER — Encounter: Payer: Self-pay | Admitting: Family Medicine

## 2024-03-29 ENCOUNTER — Ambulatory Visit (INDEPENDENT_AMBULATORY_CARE_PROVIDER_SITE_OTHER): Admitting: Family Medicine

## 2024-03-29 VITALS — BP 150/90 | HR 73 | Temp 98.9°F | Ht 71.0 in | Wt 261.0 lb

## 2024-03-29 DIAGNOSIS — I10 Essential (primary) hypertension: Secondary | ICD-10-CM

## 2024-03-29 MED ORDER — CLONIDINE 0.3 MG/24HR TD PTWK: 0.3000 mg | MEDICATED_PATCH | TRANSDERMAL | 12 refills | Status: DC

## 2024-03-29 MED ORDER — CLONIDINE HCL 0.3 MG/24HR TD PTWK: 0.3000 mg | MEDICATED_PATCH | TRANSDERMAL | 12 refills | Status: DC

## 2024-03-29 NOTE — Progress Notes (Signed)
 OFFICE VISIT  03/29/2024  CC:  Chief Complaint  Patient presents with   Medical Management of Chronic Issues    2 week hypertension f/u    Patient is a 48 y.o. male who presents for 2-week follow-up hypertension. A/P as of last visit: #1 hypertension, poor control. He says he will start working hard on lowering sodium, decreasing beer, and losing weight. We will increase Toprol -XL to 1 tab twice a day and he will start monitoring blood pressure at home and bring these in for review in 2 weeks.  Continue amlodipine  10 mg a day and irbesartan -HCTZ 150-12.5, 2 tabs daily Electrolytes and creatinine today.   #2 hypercholesterolemia, doing well on pravastatin  40 mg a day. Goal LDL less than 100. Lipid panel and hepatic panel today.   3.  Hepatic steatosis.  History of mild elevation of LFTs and alk phos. Avoid NSAIDs and Tylenol  the best he can. Repeat hepatic panel today. Of note, right upper quadrant limited ultrasound January 2023 showed stable echogenic liver likely related to the diffuse hepatocellular disease such as fatty infiltration. Consider repeat in near future.   4.  Bilateral knee pain.  Suspect osteoarthritis.  He says he has a history of meniscus tears as well. He is hesitant about seeing orthopedics.  He declined x-rays today.  He is not interested in injections. We have to minimize NSAIDs and Tylenol  is much as we can given #1 and #3 above. For now he will work on weight loss.  INTERIM HX: No acute concerns. Blood pressures average 145-150 systolic over 85-95 diastolic.  Heart rate 65-75.   Past Medical History:  Diagnosis Date   Arthritis    Colon cancer screening    Cologuard NEG 10/2023   Delayed gastric emptying 2007   per imaging in epic 08/ 2007   Edema of both lower extremities    GERD (gastroesophageal reflux disease)    History of precordial chest pain    evaluated by cardiologist, dr wonda, for atypical cp lov note in epic 08-09-2011 pt released  prn ;  normal nuclear stress test 07-27-2011 w/ ef 61%;  normal ETT 2008 per note   Hyperlipidemia    Intol of atorv, rosuva, lovalo, and zetia .  Good on pravastatin .   Hypertension    Insomnia    NASH (nonalcoholic steatohepatitis)    followed by pcp---  mild elev LFTs, viral hep serologies NEG.  Fatty liver on u/s 05/2020. Stable ultrasound 07/2021.   Pre-diabetes    Right inguinal hernia    Subclinical hypothyroidism 11/2020   11/2020->TSH very mildly elev, free T4 and T3 total NORMAL.   Umbilical hernia without obstruction and without gangrene     Past Surgical History:  Procedure Laterality Date   INGUINAL HERNIA REPAIR Right 02/26/2022   Procedure: LAPAROSCOPIC RIGHT INGUINAL HERNIA REPAIR WITH MESH;  Surgeon: Stechschulte, Deward PARAS, MD;  Location: [facility];  Service: General;  Laterality: Right;   UMBILICAL HERNIA REPAIR N/A 02/26/2022   Procedure: OPEN UMBILICAL HERNIA REPAIR WITH MESH;  Surgeon: Lyndel Deward PARAS, MD;  Location: [facility];  Service: General;  Laterality: N/A;    Outpatient Medications Prior to Visit  Medication Sig Dispense Refill   amLODipine  (NORVASC ) 10 MG tablet TAKE 1 TABLET BY MOUTH EVERY DAY 90 tablet 0   esomeprazole  (NEXIUM ) 40 MG capsule Take 1 capsule (40 mg total) by mouth 2 (two) times daily. 180 capsule 1   irbesartan -hydrochlorothiazide  (AVALIDE) 150-12.5 MG tablet Take 2 tablets  by mouth daily. 180 tablet 3   metoCLOPramide  (REGLAN ) 10 MG tablet Take 1 tablet (10 mg total) by mouth 2 (two) times daily. 180 tablet 1   metoprolol  succinate (TOPROL -XL) 100 MG 24 hr tablet Take 1 tablet twice daily     Multiple Vitamin (MULTIVITAMIN) tablet Take 1 tablet by mouth daily.     pravastatin  (PRAVACHOL ) 40 MG tablet Take 1 tablet (40 mg total) by mouth daily. 90 tablet 1   No facility-administered medications prior to visit.    Allergies  Allergen Reactions   Ciprofloxacin Nausea Only    Review of Systems As  per HPI  PE:    03/29/2024    8:30 AM 03/29/2024    8:13 AM 03/13/2024    8:20 AM  Vitals with BMI  Height  5' 11   Weight  261 lbs   BMI  36.42   Systolic 150 161 849  Diastolic 90 90 98  Pulse  73      Physical Exam  Gen: Alert, well appearing.  Patient is oriented to person, place, time, and situation. AFFECT: pleasant, lucid thought and speech. No further exam today  LABS:  Last CBC Lab Results  Component Value Date   WBC 3.8 (L) 07/14/2023   HGB 14.7 07/14/2023   HCT 43.4 07/14/2023   MCV 93.7 07/14/2023   MCH 30.5 02/11/2022   RDW 12.3 07/14/2023   PLT 230.0 07/14/2023   Last metabolic panel Lab Results  Component Value Date   GLUCOSE 113 (H) 03/13/2024   NA 137 03/13/2024   K 4.8 03/13/2024   CL 99 03/13/2024   CO2 29 03/13/2024   BUN 9 03/13/2024   CREATININE 0.66 03/13/2024   EGFR 116 03/13/2024   CALCIUM  9.9 03/13/2024   PROT 7.8 03/13/2024   ALBUMIN 4.9 07/14/2023   BILITOT 0.7 03/13/2024   ALKPHOS 135 (H) 07/14/2023   AST 36 03/13/2024   ALT 43 03/13/2024   Last lipids Lab Results  Component Value Date   CHOL 204 (H) 03/13/2024   HDL 72 03/13/2024   LDLCALC 117 (H) 03/13/2024   LDLDIRECT 176.7 07/12/2013   TRIG 60 03/13/2024   CHOLHDL 2.8 03/13/2024   Last hemoglobin A1c Lab Results  Component Value Date   HGBA1C 6.0 03/13/2024   Last thyroid  functions Lab Results  Component Value Date   TSH 2.20 10/26/2022   T3TOTAL 127 10/26/2022   IMPRESSION AND PLAN:  Uncontrolled hypertension. Add clonidine  patch, 0.3 per 24 hours--> weekly. Continue Toprol -XL 100 mg twice a day, irbesartan -HCTZ 150-12.5, 2 tabs daily, and amlodipine  10 mg a day. Continue to work on lifestyle changes.  An After Visit Summary was printed and given to the patient.  FOLLOW UP: No follow-ups on file.  Signed:  Gerlene Hockey, MD           03/29/2024

## 2024-04-04 ENCOUNTER — Telehealth: Payer: Self-pay | Admitting: Family Medicine

## 2024-04-04 NOTE — Telephone Encounter (Signed)
 CMA has already spoke with patient and scheduled appt for 10/20

## 2024-04-04 NOTE — Telephone Encounter (Signed)
 Copied from CRM #[id number]. Topic: Clinical - Medical Advice >> Apr 04, 2024  3:21 PM Henretta I wrote: Reason for CRM: Patient would like to know if Dr. Candise is okay with doing a virtual visit in 2 weeks after he starts medication as he just got it today due to pharm having to order it cloNIDine  (CATAPRES  - DOSED IN MG/24 HR) 0.3 mg/24hr patch as his appt is not scheduled till nov 6 for his f/u.

## 2024-04-06 ENCOUNTER — Other Ambulatory Visit: Payer: Self-pay

## 2024-04-06 ENCOUNTER — Ambulatory Visit: Admitting: Family Medicine

## 2024-04-06 MED ORDER — METOPROLOL SUCCINATE ER 100 MG PO TB24: ORAL_TABLET | ORAL | 0 refills | Status: DC

## 2024-04-23 ENCOUNTER — Telehealth: Admitting: Family Medicine

## 2024-04-23 ENCOUNTER — Telehealth: Payer: Self-pay

## 2024-04-23 ENCOUNTER — Encounter: Payer: Self-pay | Admitting: Family Medicine

## 2024-04-23 VITALS — BP 154/90 | HR 72

## 2024-04-23 DIAGNOSIS — R7303 Prediabetes: Secondary | ICD-10-CM

## 2024-04-23 DIAGNOSIS — E66812 Obesity, class 2: Secondary | ICD-10-CM

## 2024-04-23 DIAGNOSIS — I10 Essential (primary) hypertension: Secondary | ICD-10-CM | POA: Diagnosis not present

## 2024-04-23 DIAGNOSIS — Z6836 Body mass index (BMI) 36.0-36.9, adult: Secondary | ICD-10-CM | POA: Diagnosis not present

## 2024-04-23 DIAGNOSIS — K7581 Nonalcoholic steatohepatitis (NASH): Secondary | ICD-10-CM

## 2024-04-23 MED ORDER — SEMAGLUTIDE(0.25 OR 0.5MG/DOS) 2 MG/3ML ~~LOC~~ SOPN: PEN_INJECTOR | SUBCUTANEOUS | 0 refills | Status: DC

## 2024-04-23 NOTE — Telephone Encounter (Signed)
 Source  [place], [place] [name] (Patient)   Subject  [name] (Patient)   Topic  Clinical - Medical Advice    Communication  Reason for CRM: pt called he wants to speak to you in regards to a prescription pcp I about to prescribe. Pt just had a video visit with the pcp. Please reach out to him asap.     Patient Information  Patient Name Gender DOB SSN  Carlos James, Carlos James Male 05-Jul-1976 [id number]   Contacts  Contact Date/Time Type Contact Phone/Fax  04/23/2024 02:07 PM EDT Phone (Incoming) Carlos James, Carlos James (Self) [phone]      Spoke with patient, GLP-1 medication discussed during appt. He would like to use Wegovy instead of Ozempic.

## 2024-04-23 NOTE — Progress Notes (Signed)
 Virtual Visit via Video Note  I connected with Carlos James  on 04/23/24 at  1:20 PM EDT by a video enabled telemedicine application and verified that I am speaking with the correct person using two identifiers.  Location patient: [place] Location provider:work or home office Persons participating in the virtual visit: patient, provider  I discussed the limitations and requested verbal permission for telemedicine visit. The patient expressed understanding and agreed to proceed.  CC: f/u HTN 48 year old male being seen for 2-week follow-up hypertension. A/P as of last visit: Uncontrolled hypertension. Add clonidine  patch, 0.3 per 24 hours--> weekly. Continue Toprol -XL 100 mg twice a day, irbesartan -HCTZ 150-12.5, 2 tabs daily, and amlodipine  10 mg a day. Continue to work on lifestyle changes.  INTERIM HX: He is about midway through his second clonidine  patch.  His blood pressures have not improved any. No headaches, dizziness, chest pain, shortness of breath, dyspnea on exertion, palpitations, or lower extremity swelling. He does continue to drink beer.  Review of systems: As above, plus no nausea or abdominal pain or diarrhea.  No rash.  ROS: See pertinent positives and negatives per HPI.  Past Medical History:  Diagnosis Date   Arthritis    Colon cancer screening    Cologuard NEG 10/2023   Delayed gastric emptying 2007   per imaging in epic 08/ 2007   Edema of both lower extremities    GERD (gastroesophageal reflux disease)    History of precordial chest pain    evaluated by cardiologist, dr wonda, for atypical cp lov note in epic 08-09-2011 pt released prn ;  normal nuclear stress test 07-27-2011 w/ ef 61%;  normal ETT 2008 per note   Hyperlipidemia    Intol of atorv, rosuva, lovalo, and zetia .  Good on pravastatin .   Hypertension    Insomnia    NASH (nonalcoholic steatohepatitis)    followed by pcp---  mild elev LFTs, viral hep serologies NEG.  Fatty liver on u/s 05/2020. Stable  ultrasound 07/2021.   Pre-diabetes    Right inguinal hernia    Subclinical hypothyroidism 11/2020   11/2020->TSH very mildly elev, free T4 and T3 total NORMAL.   Umbilical hernia without obstruction and without gangrene     Past Surgical History:  Procedure Laterality Date   INGUINAL HERNIA REPAIR Right 02/26/2022   Procedure: LAPAROSCOPIC RIGHT INGUINAL HERNIA REPAIR WITH MESH;  Surgeon: Stechschulte, Deward PARAS, MD;  Location: [facility];  Service: General;  Laterality: Right;   UMBILICAL HERNIA REPAIR N/A 02/26/2022   Procedure: OPEN UMBILICAL HERNIA REPAIR WITH MESH;  Surgeon: Stechschulte, Deward PARAS, MD;  Location: [facility];  Service: General;  Laterality: N/A;     Current Outpatient Medications:    amLODipine  (NORVASC ) 10 MG tablet, TAKE 1 TABLET BY MOUTH EVERY DAY, Disp: 90 tablet, Rfl: 0   cloNIDine  (CATAPRES  - DOSED IN MG/24 HR) 0.3 mg/24hr patch, Place 1 patch (0.3 mg total) onto the skin once a week., Disp: 4 patch, Rfl: 12   esomeprazole  (NEXIUM ) 40 MG capsule, Take 1 capsule (40 mg total) by mouth 2 (two) times daily., Disp: 180 capsule, Rfl: 1   irbesartan -hydrochlorothiazide  (AVALIDE) 150-12.5 MG tablet, Take 2 tablets by mouth daily., Disp: 180 tablet, Rfl: 3   metoCLOPramide  (REGLAN ) 10 MG tablet, Take 1 tablet (10 mg total) by mouth 2 (two) times daily., Disp: 180 tablet, Rfl: 1   metoprolol  succinate (TOPROL -XL) 100 MG 24 hr tablet, Take 1 tablet twice daily, Disp: 60 tablet, Rfl: 0  Multiple Vitamin (MULTIVITAMIN) tablet, Take 1 tablet by mouth daily., Disp: , Rfl:    pravastatin  (PRAVACHOL ) 40 MG tablet, Take 1 tablet (40 mg total) by mouth daily., Disp: 90 tablet, Rfl: 1  EXAM:  VITALS per patient if applicable:     04/23/2024    1:22 PM 04/23/2024    1:21 PM 03/29/2024    8:30 AM  Vitals with BMI  Systolic 154 159 849  Diastolic 90 95 90  Pulse  72      GENERAL: alert, oriented, appears well and in no acute distress  HEENT:  atraumatic, conjunttiva clear, no obvious abnormalities on inspection of external nose and ears  NECK: normal movements of the head and neck  LUNGS: on inspection no signs of respiratory distress, breathing rate appears normal, no obvious gross SOB, gasping or wheezing  CV: no obvious cyanosis  MS: moves all visible extremities without noticeable abnormality  PSYCH/NEURO: pleasant and cooperative, no obvious depression or anxiety, speech and thought processing grossly intact  NONE today    Chemistry      Component Value Date/Time   NA 137 03/13/2024 0824   K 4.8 03/13/2024 0824   CL 99 03/13/2024 0824   CO2 29 03/13/2024 0824   BUN 9 03/13/2024 0824   CREATININE 0.66 03/13/2024 0824      Component Value Date/Time   CALCIUM  9.9 03/13/2024 0824   ALKPHOS 135 (H) 07/14/2023 0906   AST 36 03/13/2024 0824   ALT 43 03/13/2024 0824   BILITOT 0.7 03/13/2024 0824     Lab Results  Component Value Date   HGBA1C 6.0 03/13/2024   ASSESSMENT AND PLAN:  Discussed the following assessment and plan:  #1 uncontrolled hypertension. Will keep going with clonidine  patch 0.3, Toprol -XL 100 mg twice a day, irbesartan -HCTZ 150-12.5, 2 tabs daily, and amlodipine  10 mg a day. Will ask cardiology to see him. Continue to work on lifestyle changes, including cutting back on alcohol.   #2 morbid obesity (BMI 36).  He has prediabetes, hyperlipidemia, and NASH. I do not know if insurance will consider covering GLP-1 but I will go ahead and prescribe Ozempic and we will see.  I discussed the assessment and treatment plan with the patient. The patient was provided an opportunity to ask questions and all were answered. The patient agreed with the plan and demonstrated an understanding of the instructions.   F/u: Keep appointment set for 05/10/2024.  Signed:  Gerlene Hockey, MD           04/23/2024

## 2024-04-23 NOTE — Addendum Note (Signed)
 Addended by: CANDISE ALEENE DEL on: 04/23/2024 04:54 PM   Modules accepted: Orders

## 2024-04-24 ENCOUNTER — Telehealth: Payer: Self-pay

## 2024-04-24 MED ORDER — WEGOVY 0.25 MG/0.5ML ~~LOC~~ SOAJ: 0.2500 mg | SUBCUTANEOUS | 0 refills | Status: DC

## 2024-04-24 NOTE — Telephone Encounter (Signed)
 Pharmacy Patient Advocate Encounter   Received notification from CoverMyMeds that prior authorization for [facility] 0.25MG /0.5ML auto-injectors  is required/requested.   Insurance verification completed.   The patient is insured through [facility].   Per test claim: PA required; PA submitted to above mentioned insurance via Latent Key/confirmation #/EOC [id number] Status is pending

## 2024-04-24 NOTE — Addendum Note (Signed)
 Addended by: CANDISE ALEENE DEL on: 04/24/2024 08:10 AM   Modules accepted: Orders

## 2024-04-24 NOTE — Telephone Encounter (Signed)
Abundio Miu prescription sent

## 2024-04-25 NOTE — Telephone Encounter (Signed)
 Prior Authorization form/request asks a question that requires your assistance. Please see the question below and advise accordingly. The PA will not be submitted until the necessary information is received. PLEASE BE ADVISED CAN NOT COMPLETE PA WITHOUT ANSWERING OF QUESTION.  I DO NOT SEE ANYTHING IN CHART. PLEASE ASSIST WITH THIS THANKS.

## 2024-04-26 NOTE — Telephone Encounter (Signed)
 No he has not.

## 2024-04-26 NOTE — Telephone Encounter (Signed)
Please see response below

## 2024-04-27 NOTE — Telephone Encounter (Signed)
PA request has been submitted and is pending.

## 2024-04-29 ENCOUNTER — Other Ambulatory Visit: Payer: Self-pay | Admitting: Family Medicine

## 2024-04-30 ENCOUNTER — Other Ambulatory Visit (HOSPITAL_COMMUNITY): Payer: Self-pay

## 2024-04-30 NOTE — Telephone Encounter (Signed)
 Pharmacy Patient Advocate Encounter  Received notification from [facility] that Prior Authorization for [facility] 0.25MG /0.5ML auto-injector  has been DENIED.  See denial reason below. No denial letter attached in CMM. Will attach denial letter to Media tab once received.   PA #/Case ID/Reference #: [id number]  PLEASE BE ADVISED DENIAL FROM PLAN IS

## 2024-05-01 ENCOUNTER — Other Ambulatory Visit (HOSPITAL_COMMUNITY): Payer: Self-pay

## 2024-05-02 ENCOUNTER — Other Ambulatory Visit: Payer: Self-pay | Admitting: Family Medicine

## 2024-05-03 ENCOUNTER — Other Ambulatory Visit (HOSPITAL_COMMUNITY): Payer: Self-pay

## 2024-05-04 ENCOUNTER — Other Ambulatory Visit (HOSPITAL_COMMUNITY): Payer: Self-pay

## 2024-05-07 ENCOUNTER — Other Ambulatory Visit (HOSPITAL_COMMUNITY): Payer: Self-pay

## 2024-05-10 ENCOUNTER — Ambulatory Visit (INDEPENDENT_AMBULATORY_CARE_PROVIDER_SITE_OTHER): Admitting: Family Medicine

## 2024-05-10 ENCOUNTER — Encounter: Payer: Self-pay | Admitting: Family Medicine

## 2024-05-10 VITALS — BP 150/88 | HR 68 | Temp 97.6°F | Ht 71.0 in | Wt 260.4 lb

## 2024-05-10 DIAGNOSIS — E7800 Pure hypercholesterolemia, unspecified: Secondary | ICD-10-CM | POA: Diagnosis not present

## 2024-05-10 DIAGNOSIS — I10 Essential (primary) hypertension: Secondary | ICD-10-CM

## 2024-05-10 DIAGNOSIS — K760 Fatty (change of) liver, not elsewhere classified: Secondary | ICD-10-CM | POA: Diagnosis not present

## 2024-05-10 LAB — COMPREHENSIVE METABOLIC PANEL WITH GFR
ALT: 36 U/L (ref 0–53)
AST: 29 U/L (ref 0–37)
Albumin: 4.7 g/dL (ref 3.5–5.2)
Alkaline Phosphatase: 122 U/L — ABNORMAL HIGH (ref 39–117)
BUN: 11 mg/dL (ref 6–23)
CO2: 30 meq/L (ref 19–32)
Calcium: 9.5 mg/dL (ref 8.4–10.5)
Chloride: 98 meq/L (ref 96–112)
Creatinine, Ser: 0.64 mg/dL (ref 0.40–1.50)
GFR: 111.62 mL/min (ref >60.00)
Glucose, Bld: 102 mg/dL — ABNORMAL HIGH (ref 70–99)
Potassium: 4.8 meq/L (ref 3.5–5.1)
Sodium: 137 meq/L (ref 135–145)
Total Bilirubin: 0.8 mg/dL (ref 0.2–1.2)
Total Protein: 7.9 g/dL (ref 6.0–8.3)

## 2024-05-10 LAB — LIPID PANEL
Cholesterol: 201 mg/dL — ABNORMAL HIGH (ref 0–200)
HDL: 70.8 mg/dL (ref >39.00)
LDL Cholesterol: 116 mg/dL — ABNORMAL HIGH (ref 0–99)
NonHDL: 129.79
Total CHOL/HDL Ratio: 3
Triglycerides: 71 mg/dL (ref 0.0–149.0)
VLDL: 14.2 mg/dL (ref 0.0–40.0)

## 2024-05-10 MED ORDER — SPIRONOLACTONE 25 MG PO TABS: 25.0000 mg | ORAL_TABLET | Freq: Every day | ORAL | 0 refills | Status: DC

## 2024-05-10 NOTE — Patient Instructions (Addendum)
 [facility] [address] [place] [address] [phone]

## 2024-05-10 NOTE — Progress Notes (Signed)
 OFFICE VISIT  05/10/2024  CC:  Chief Complaint  Patient presents with   Medical Management of Chronic Issues    Pt is fasting    Patient is a 48 y.o. male who presents for 2-week follow-up uncontrolled hypertension, hypercholesterolemia, MASLD. A/P as of last visit: #1 uncontrolled hypertension. Will keep going with clonidine  patch 0.3, Toprol -XL 100 mg twice a day, irbesartan -HCTZ 150-12.5, 2 tabs daily, and amlodipine  10 mg a day. Will ask cardiology to see him. Continue to work on lifestyle changes, including cutting back on alcohol.   #2 morbid obesity (BMI 36).  He has prediabetes, hyperlipidemia, and NASH. I do not know if insurance will consider covering GLP-1 but I will go ahead and prescribe Ozempic and we will see.  INTERIM HX: Arvella feels very well. He continues to cut back on beer. Home blood pressures are still in the 140-150 range systolic over 80s diastolic. He does not think the clonidine  patch has improved his blood pressure at all and possibly has even raised it a few points consistently.  Insurance did not cover the [employer].  He would like to pay out-of-pocket. He has chronic bilateral knee pain due to osteoarthritis and torn menisci. This makes it impossible for him to do any vigorous exercise. He is fearful of steroid injections because these are very painful for him. He saw an orthopedist in the remote past but does not recall who.  ROS --> no fevers, no CP, no SOB, no wheezing, no cough, no dizziness, no HAs, no rashes, no melena/hematochezia.  No polyuria or polydipsia.  No focal weakness, paresthesias, or tremors.  No acute vision or hearing abnormalities.  No dysuria or unusual/new urinary urgency or frequency.  No recent changes in lower legs. No n/v/d or abd pain.  No palpitations.     Past Medical History:  Diagnosis Date   Arthritis    Colon cancer screening    Cologuard NEG 10/2023   Delayed gastric emptying 2007   per imaging in epic 08/ 2007    Edema of both lower extremities    GERD (gastroesophageal reflux disease)    History of precordial chest pain    evaluated by cardiologist, dr wonda, for atypical cp lov note in epic 08-09-2011 pt released prn ;  normal nuclear stress test 07-27-2011 w/ ef 61%;  normal ETT 2008 per note   Hyperlipidemia    Intol of atorv, rosuva, lovalo, and zetia .  Good on pravastatin .   Hypertension    Insomnia    NASH (nonalcoholic steatohepatitis)    followed by pcp---  mild elev LFTs, viral hep serologies NEG.  Fatty liver on u/s 05/2020. Stable ultrasound 07/2021.   Pre-diabetes    Right inguinal hernia    Subclinical hypothyroidism 11/2020   11/2020->TSH very mildly elev, free T4 and T3 total NORMAL.   Umbilical hernia without obstruction and without gangrene     Past Surgical History:  Procedure Laterality Date   INGUINAL HERNIA REPAIR Right 02/26/2022   Procedure: LAPAROSCOPIC RIGHT INGUINAL HERNIA REPAIR WITH MESH;  Surgeon: Stechschulte, Deward PARAS, MD;  Location: [facility];  Service: General;  Laterality: Right;   UMBILICAL HERNIA REPAIR N/A 02/26/2022   Procedure: OPEN UMBILICAL HERNIA REPAIR WITH MESH;  Surgeon: Stechschulte, Deward PARAS, MD;  Location: [facility];  Service: General;  Laterality: N/A;    Outpatient Medications Prior to Visit  Medication Sig Dispense Refill   amLODipine  (NORVASC ) 10 MG tablet TAKE 1 TABLET BY MOUTH EVERY DAY  90 tablet 0   esomeprazole  (NEXIUM ) 40 MG capsule Take 1 capsule (40 mg total) by mouth 2 (two) times daily. 180 capsule 1   irbesartan -hydrochlorothiazide  (AVALIDE) 150-12.5 MG tablet Take 2 tablets by mouth daily. 180 tablet 3   metoCLOPramide  (REGLAN ) 10 MG tablet Take 1 tablet (10 mg total) by mouth 2 (two) times daily. 180 tablet 1   metoprolol  succinate (TOPROL -XL) 100 MG 24 hr tablet TAKE 1 TABLET BY MOUTH TWICE A DAY 60 tablet 0   Multiple Vitamin (MULTIVITAMIN) tablet Take 1 tablet by mouth daily.     pravastatin   (PRAVACHOL ) 40 MG tablet Take 1 tablet (40 mg total) by mouth daily. 90 tablet 1   cloNIDine  (CATAPRES  - DOSED IN MG/24 HR) 0.3 mg/24hr patch Place 1 patch (0.3 mg total) onto the skin once a week. 4 patch 12   semaglutide-weight management (WEGOVY) 0.25 MG/0.5ML SOAJ SQ injection Inject 0.25 mg into the skin once a week. (Patient not taking: Reported on 05/10/2024) 2 mL 0   No facility-administered medications prior to visit.    Allergies  Allergen Reactions   Ciprofloxacin Nausea Only    Review of Systems As per HPI  PE:    05/10/2024    8:52 AM 05/10/2024    8:42 AM 04/23/2024    1:22 PM  Vitals with BMI  Height  5' 11   Weight  260 lbs 6 oz   BMI  36.33   Systolic 150 163 845  Diastolic 88 84 90  Pulse  68      Physical Exam  Gen: Alert, well appearing.  Patient is oriented to person, place, time, and situation. AFFECT: pleasant, lucid thought and speech. No further exam today.  LABS:  Last CBC Lab Results  Component Value Date   WBC 3.8 (L) 07/14/2023   HGB 14.7 07/14/2023   HCT 43.4 07/14/2023   MCV 93.7 07/14/2023   MCH 30.5 02/11/2022   RDW 12.3 07/14/2023   PLT 230.0 07/14/2023   Last metabolic panel Lab Results  Component Value Date   GLUCOSE 113 (H) 03/13/2024   NA 137 03/13/2024   K 4.8 03/13/2024   CL 99 03/13/2024   CO2 29 03/13/2024   BUN 9 03/13/2024   CREATININE 0.66 03/13/2024   EGFR 116 03/13/2024   CALCIUM  9.9 03/13/2024   PROT 7.8 03/13/2024   ALBUMIN 4.9 07/14/2023   BILITOT 0.7 03/13/2024   ALKPHOS 135 (H) 07/14/2023   AST 36 03/13/2024   ALT 43 03/13/2024   Last lipids Lab Results  Component Value Date   CHOL 204 (H) 03/13/2024   HDL 72 03/13/2024   LDLCALC 117 (H) 03/13/2024   LDLDIRECT 176.7 07/12/2013   TRIG 60 03/13/2024   CHOLHDL 2.8 03/13/2024   Last hemoglobin A1c Lab Results  Component Value Date   HGBA1C 6.0 03/13/2024   Last thyroid  functions Lab Results  Component Value Date   TSH 2.20 10/26/2022    T3TOTAL 127 10/26/2022   FREET4 1.2 10/26/2022   IMPRESSION AND PLAN:  #1 uncontrolled hypertension. Discontinue clonidine  patch. Start spironolactone 25 mg a day and monitor potassium and renal function closely--> get levels today and again in 1 week. Continue amlodipine  10 mg a day, irbesartan -HCTZ 150-12.5, 2 tabs daily and also continue Toprol -XL 200 mg a day.  #2 morbid obesity with cholesterolemia and NAFLD (likely a little bit of contribution from alcohol as well) and prediabetes. He is on pravastatin  40 mg a day. Monitor lipid panel and hepatic panel  today. He is trying to cut back on beer. Although insurance does not cover GLP-1 agonist he is going to check into the cost for self-pay.  He requested sample today of Wegovy to get him started this month and we did go ahead and give him this sample.  Next A1c after 06/12/2024.  #3 chronic bilateral knee pain due to osteoarthritis. He is going to give steroid shots some consideration.  We would like to get plain films of both knees at that time. Also if he would like I will refer him to an orthopedist.  An After Visit Summary was printed and given to the patient.  FOLLOW UP: Return in about 2 weeks (around 05/24/2024) for f/u HTN.  Lab appt 1 wk for potassium check. Next CPE January 2026 Signed:  Gerlene Hockey, MD           05/10/2024

## 2024-05-17 NOTE — Addendum Note (Signed)
 Addended by: FLETA CARE D on: 05/17/2024 02:09 PM   Modules accepted: Orders

## 2024-05-18 ENCOUNTER — Other Ambulatory Visit (INDEPENDENT_AMBULATORY_CARE_PROVIDER_SITE_OTHER)

## 2024-05-18 DIAGNOSIS — I10 Essential (primary) hypertension: Secondary | ICD-10-CM | POA: Diagnosis not present

## 2024-05-18 LAB — COMPREHENSIVE METABOLIC PANEL WITH GFR
ALT: 40 U/L (ref 0–53)
AST: 29 U/L (ref 0–37)
Albumin: 4.8 g/dL (ref 3.5–5.2)
Alkaline Phosphatase: 122 U/L — ABNORMAL HIGH (ref 39–117)
BUN: 17 mg/dL (ref 6–23)
CO2: 28 meq/L (ref 19–32)
Calcium: 9.7 mg/dL (ref 8.4–10.5)
Chloride: 96 meq/L (ref 96–112)
Creatinine, Ser: 0.79 mg/dL (ref 0.40–1.50)
GFR: 104.72 mL/min (ref >60.00)
Glucose, Bld: 107 mg/dL — ABNORMAL HIGH (ref 70–99)
Potassium: 4.5 meq/L (ref 3.5–5.1)
Sodium: 133 meq/L — ABNORMAL LOW (ref 135–145)
Total Bilirubin: 0.8 mg/dL (ref 0.2–1.2)
Total Protein: 7.9 g/dL (ref 6.0–8.3)

## 2024-05-20 ENCOUNTER — Ambulatory Visit: Payer: Self-pay | Admitting: Family Medicine

## 2024-05-26 ENCOUNTER — Other Ambulatory Visit: Payer: Self-pay | Admitting: Family Medicine

## 2024-05-27 ENCOUNTER — Other Ambulatory Visit: Payer: Self-pay | Admitting: Family Medicine

## 2024-05-27 DIAGNOSIS — I10 Essential (primary) hypertension: Secondary | ICD-10-CM

## 2024-05-28 ENCOUNTER — Encounter: Payer: Self-pay | Admitting: Family Medicine

## 2024-05-28 MED ORDER — WEGOVY 0.5 MG/0.5ML ~~LOC~~ SOAJ: 0.5000 mg | SUBCUTANEOUS | 2 refills | Status: DC

## 2024-05-28 NOTE — Telephone Encounter (Signed)
 Pt is requesting refill for Wegovy , rx pending to requested pharmacy

## 2024-05-28 NOTE — Telephone Encounter (Signed)
 Wegovy  0.5 mg dose sent.

## 2024-05-30 ENCOUNTER — Other Ambulatory Visit: Payer: Self-pay | Admitting: Family Medicine

## 2024-06-03 ENCOUNTER — Other Ambulatory Visit: Payer: Self-pay | Admitting: Family Medicine

## 2024-06-06 ENCOUNTER — Other Ambulatory Visit: Payer: Self-pay | Admitting: Family Medicine

## 2024-06-11 ENCOUNTER — Encounter: Payer: Self-pay | Admitting: Family Medicine

## 2024-06-11 ENCOUNTER — Other Ambulatory Visit: Payer: Self-pay

## 2024-06-11 MED ORDER — SPIRONOLACTONE 25 MG PO TABS: 25.0000 mg | ORAL_TABLET | Freq: Every day | ORAL | 0 refills | Status: DC

## 2024-06-20 ENCOUNTER — Other Ambulatory Visit: Payer: Self-pay | Admitting: Family Medicine

## 2024-06-20 DIAGNOSIS — I10 Essential (primary) hypertension: Secondary | ICD-10-CM

## 2024-06-22 ENCOUNTER — Other Ambulatory Visit: Payer: Self-pay | Admitting: Family Medicine

## 2024-06-22 NOTE — Telephone Encounter (Signed)
 Copied from CRM #[id number]. Topic: Clinical - Medication Refill >> Jun 22, 2024  8:05 AM Laymon HERO wrote: Medication: irbesartan -hydrochlorothiazide  (AVALIDE) 150-12.5 MG tablet  Has the patient contacted their pharmacy? Yes (Agent: If no, request that the patient contact the pharmacy for the refill. If patient does not wish to contact the pharmacy document the reason why and proceed with request.) (Agent: If yes, when and what did the pharmacy advise?)  This is the patient's preferred pharmacy:  [id number] - [place], [place] - [address] [place] [address] Phone: [phone] Fax: [phone]   Is this the correct pharmacy for this prescription? Yes If no, delete pharmacy and type the correct one.   Has the prescription been filled recently? Yes  Is the patient out of the medication? Yes  Has the patient been seen for an appointment in the last year OR does the patient have an upcoming appointment? Yes  Can we respond through MyChart? Yes  Agent: Please be advised that Rx refills may take up to 3 business days. We ask that you follow-up with your pharmacy.

## 2024-06-22 NOTE — Telephone Encounter (Signed)
 RF sent for 1 year supply on 03/13/24

## 2024-06-25 ENCOUNTER — Ambulatory Visit: Admitting: Family Medicine

## 2024-06-28 ENCOUNTER — Other Ambulatory Visit: Payer: Self-pay | Admitting: Family Medicine

## 2024-07-10 ENCOUNTER — Encounter: Payer: Self-pay | Admitting: Family Medicine

## 2024-07-10 ENCOUNTER — Ambulatory Visit (INDEPENDENT_AMBULATORY_CARE_PROVIDER_SITE_OTHER): Admitting: Family Medicine

## 2024-07-10 ENCOUNTER — Other Ambulatory Visit (HOSPITAL_COMMUNITY): Payer: Self-pay

## 2024-07-10 VITALS — BP 146/90 | HR 83 | Temp 98.3°F | Ht 71.0 in | Wt 264.2 lb

## 2024-07-10 DIAGNOSIS — Z0000 Encounter for general adult medical examination without abnormal findings: Secondary | ICD-10-CM | POA: Diagnosis not present

## 2024-07-10 DIAGNOSIS — G8929 Other chronic pain: Secondary | ICD-10-CM

## 2024-07-10 DIAGNOSIS — E7800 Pure hypercholesterolemia, unspecified: Secondary | ICD-10-CM | POA: Diagnosis not present

## 2024-07-10 DIAGNOSIS — I10 Essential (primary) hypertension: Secondary | ICD-10-CM | POA: Diagnosis not present

## 2024-07-10 DIAGNOSIS — K760 Fatty (change of) liver, not elsewhere classified: Secondary | ICD-10-CM | POA: Diagnosis not present

## 2024-07-10 DIAGNOSIS — M25562 Pain in left knee: Secondary | ICD-10-CM

## 2024-07-10 DIAGNOSIS — R7303 Prediabetes: Secondary | ICD-10-CM | POA: Diagnosis not present

## 2024-07-10 DIAGNOSIS — M1712 Unilateral primary osteoarthritis, left knee: Secondary | ICD-10-CM

## 2024-07-10 MED ORDER — WEGOVY 1 MG/0.5ML ~~LOC~~ SOAJ: 1.0000 mg | SUBCUTANEOUS | 2 refills | Status: DC

## 2024-07-10 MED ORDER — SPIRONOLACTONE 25 MG PO TABS: ORAL_TABLET | ORAL | Status: DC

## 2024-07-10 NOTE — Progress Notes (Signed)
 OFFICE VISIT  07/10/2024  CC:  Chief Complaint  Carlos James presents with   Medical Management of Chronic Issues    Pt is fasting    Carlos James is a 49 y.o. male who presents for annual health maintenance exam and 30-month follow-up uncontrolled hypertension, obesity, and bilateral knee pain. A/P as of last visit: #1 uncontrolled hypertension. Discontinue clonidine  patch. Start spironolactone  25 mg a day and monitor potassium and renal function closely--> get levels today and again in 1 week. Continue amlodipine  10 mg a day, irbesartan -HCTZ 150-12.5, 2 tabs daily and also continue Toprol -XL 200 mg a day.   #2 morbid obesity with cholesterolemia and NAFLD (likely a little bit of contribution from alcohol as well) and prediabetes. He is on pravastatin  40 mg a day. Monitor lipid panel and hepatic panel today. He is trying to cut back on beer. Although insurance does not cover GLP-1 agonist he is going to check into the cost for self-pay.  He requested sample today of Wegovy  to get him started this month and we did go ahead and give him this sample.  #3 chronic bilateral knee pain due to osteoarthritis. He is going to give steroid shots some consideration.  We would like to get plain films of both knees at that time. Also if he would like I will refer him to an orthopedist.  INTERIM HX: Feeling well is in chronic left knee pain. No home blood pressure monitoring since I last saw him.  He has been on Wegovy  0.5 mg weekly for the last 2 months. No side effects.  No weight loss yet.  His left knee has hurt off and on for several years, gradually getting worse.  Some days it does not hurt but then he will go 2 weeks with a aching all day.  Feels the pain mostly on the medial joint line area and in the popliteal fossa region.  It does not swell or turn red.  He has a remote history of a meniscal tear per his report.  His right knee does not bother him much at all.  Past Medical History:   Diagnosis Date   Arthritis    Colon cancer screening    Cologuard NEG 10/2023   Delayed gastric emptying 2007   per imaging in epic 08/ 2007   Edema of both lower extremities    GERD (gastroesophageal reflux disease)    History of precordial chest pain    evaluated by cardiologist, dr wonda, for atypical cp lov note in epic 08-09-2011 pt released prn ;  normal nuclear stress test 07-27-2011 w/ ef 61%;  normal ETT 2008 per note   Hyperlipidemia    Intol of atorv, rosuva, lovalo, and zetia .  Good on pravastatin .   Hypertension    Insomnia    NASH (nonalcoholic steatohepatitis)    followed by pcp---  mild elev LFTs, viral hep serologies NEG.  Fatty liver on u/s 05/2020. Stable ultrasound 07/2021.   Pre-diabetes    Right inguinal hernia    Subclinical hypothyroidism 11/2020   11/2020->TSH very mildly elev, free T4 and T3 total NORMAL.   Umbilical hernia without obstruction and without gangrene     Past Surgical History:  Procedure Laterality Date   INGUINAL HERNIA REPAIR Right 02/26/2022   Procedure: LAPAROSCOPIC RIGHT INGUINAL HERNIA REPAIR WITH MESH;  Surgeon: Stechschulte, Deward PARAS, MD;  Location: [facility];  Service: General;  Laterality: Right;   UMBILICAL HERNIA REPAIR N/A 02/26/2022   Procedure: OPEN UMBILICAL HERNIA REPAIR  WITH MESH;  Surgeon: Stechschulte, Deward PARAS, MD;  Location: [facility];  Service: General;  Laterality: N/A;   Social History   Socioeconomic History   Marital status: Married    Spouse name: Not on file   Number of children: Not on file   Years of education: Not on file   Highest education level: 12th grade  Occupational History   Not on file  Tobacco Use   Smoking status: Former    Current packs/day: 0.00    Types: Cigarettes    Start date: 2002    Quit date: 2003    Years since quitting: 23.0   Smokeless tobacco: Current    Types: Snuff  Vaping Use   Vaping status: Never Used  Substance and Sexual Activity    Alcohol use: Yes    Comment: rare   Drug use: Never   Sexual activity: Yes  Other Topics Concern   Not on file  Social History Narrative   Married, 2 chldren.   Occupation: Owns a academic librarian.   Orig from [place].   He dips.  No smoking.  Alcohol intake: 2-3 beers a day on his days off.            Social Drivers of Health   Tobacco Use: High Risk (07/10/2024)   Carlos James History    Smoking Tobacco Use: Former    Smokeless Tobacco Use: Current    Passive Exposure: Not on file  Financial Resource Strain: Low Risk (02/10/2022)   Overall Financial Resource Strain (CARDIA)    Difficulty of Paying Living Expenses: Not hard at all  Food Insecurity: No Food Insecurity (02/10/2022)   Hunger Vital Sign    Worried About Running Out of Food in the Last Year: Never true    Ran Out of Food in the Last Year: Never true  Transportation Needs: No Transportation Needs (02/10/2022)   PRAPARE - Administrator, Civil Service (Medical): No    Lack of Transportation (Non-Medical): No  Physical Activity: Unknown (02/10/2022)   Exercise Vital Sign    Days of Exercise per Week: Carlos James declined    Minutes of Exercise per Session: Not on file  Stress: No Stress Concern Present (02/10/2022)   [employer] of Occupational Health - Occupational Stress Questionnaire    Feeling of Stress : Not at all  Social Connections: Unknown (02/10/2022)   Social Connection and Isolation Panel    Frequency of Communication with Friends and Family: More than three times a week    Frequency of Social Gatherings with Friends and Family: Once a week    Attends Religious Services: Carlos James declined    Active Member of Clubs or Organizations: No    Attends Engineer, Structural: Not on file    Marital Status: Married  Depression (PHQ2-9): Low Risk (03/13/2024)   Depression (PHQ2-9)    PHQ-2 Score: 0  Alcohol Screen: Low Risk (02/10/2022)   Alcohol Screen    Last Alcohol Screening Score (AUDIT): 4   Housing: Low Risk (02/10/2022)   Housing    Last Housing Risk Score: 0  Utilities: Not on file  Health Literacy: Not on file   Family History  Problem Relation Age of Onset   Coronary artery disease Other    Arthritis Mother    Heart disease Mother    Hyperlipidemia Mother    Arthritis Father    Heart disease Father    Hyperlipidemia Father    ROS as above, plus--> no  fevers, no CP, no SOB, no wheezing, no cough, no dizziness, no HAs, no rashes, no melena/hematochezia.  No polyuria or polydipsia.  No myalgias.  No focal weakness, paresthesias, or tremors.  No acute vision or hearing abnormalities.  No dysuria or unusual/new urinary urgency or frequency.  No recent changes in lower legs. No n/v/d or abd pain.  No palpitations.    Outpatient Medications Prior to Visit  Medication Sig Dispense Refill   amLODipine  (NORVASC ) 10 MG tablet TAKE 1 TABLET BY MOUTH EVERY DAY 90 tablet 0   esomeprazole  (NEXIUM ) 40 MG capsule Take 1 capsule (40 mg total) by mouth 2 (two) times daily. 180 capsule 1   irbesartan -hydrochlorothiazide  (AVALIDE) 150-12.5 MG tablet Take 2 tablets by mouth daily. 180 tablet 3   metoCLOPramide  (REGLAN ) 10 MG tablet Take 1 tablet (10 mg total) by mouth 2 (two) times daily. 180 tablet 1   metoprolol  succinate (TOPROL -XL) 100 MG 24 hr tablet TAKE 1 TABLET BY MOUTH TWICE A DAY 180 tablet 0   Multiple Vitamin (MULTIVITAMIN) tablet Take 1 tablet by mouth daily.     pravastatin  (PRAVACHOL ) 40 MG tablet Take 1 tablet (40 mg total) by mouth daily. 90 tablet 1   semaglutide -weight management (WEGOVY ) 0.5 MG/0.5ML SOAJ SQ injection Inject 0.5 mg into the skin once a week. 2 mL 2   spironolactone  (ALDACTONE ) 25 MG tablet Take 1 tablet (25 mg total) by mouth daily. 90 tablet 0   No facility-administered medications prior to visit.    Allergies[1]  Review of Systems As per HPI  PE:    07/10/2024    8:09 AM 05/10/2024    8:52 AM 05/10/2024    8:42 AM  Vitals with BMI  Height  5' 11  5' 11  Weight 264 lbs 3 oz  260 lbs 6 oz  BMI 36.86  36.33  Systolic 157 150 836  Diastolic 83 88 84  Pulse 83  68     Physical Exam  Gen: Alert, well appearing.  Carlos James is oriented to person, place, time, and situation. AFFECT: pleasant, lucid thought and speech. ENT: Ears: EACs clear, normal epithelium.  TMs with good light reflex and landmarks bilaterally.  Eyes: no injection, icteris, swelling, or exudate.  EOMI, PERRLA. Nose: no drainage or turbinate edema/swelling.  No injection or focal lesion.  Mouth: lips without lesion/swelling.  Oral mucosa pink and moist.  Dentition intact and without obvious caries or gingival swelling.  Oropharynx without erythema, exudate, or swelling.  Neck: supple/nontender.  No LAD, mass, or TM.  Carotid pulses 2+ bilaterally, without bruits. CV: RRR, no m/r/g.   LUNGS: CTA bilat, nonlabored resps, good aeration in all lung fields. ABD: soft, NT, ND, BS normal.  No hepatospenomegaly or mass.  No bruits. EXT: no clubbing, cyanosis, or edema.  Musculoskeletal: Left knee without erythema or obvious swelling.  No warmth. He has active and passive range of motion in the knee to full extension but flexion limited to 90 degrees.  He has some medial joint line tenderness as well as pain medially with varus stress.  No knee instability.  Patellar grind negative.  No popliteal mass. Skin - no sores or suspicious lesions or rashes or color changes   LABS:  Last CBC Lab Results  Component Value Date   WBC 3.8 (L) 07/14/2023   HGB 14.7 07/14/2023   HCT 43.4 07/14/2023   MCV 93.7 07/14/2023   MCH 30.5 02/11/2022   RDW 12.3 07/14/2023   PLT 230.0 07/14/2023  Last metabolic panel Lab Results  Component Value Date   GLUCOSE 107 (H) 05/18/2024   NA 133 (L) 05/18/2024   K 4.5 05/18/2024   CL 96 05/18/2024   CO2 28 05/18/2024   BUN 17 05/18/2024   CREATININE 0.79 05/18/2024   GFR 104.72 05/18/2024   CALCIUM  9.7 05/18/2024   PROT 7.9  05/18/2024   ALBUMIN 4.8 05/18/2024   BILITOT 0.8 05/18/2024   ALKPHOS 122 (H) 05/18/2024   AST 29 05/18/2024   ALT 40 05/18/2024   Last lipids Lab Results  Component Value Date   CHOL 201 (H) 05/10/2024   HDL 70.80 05/10/2024   LDLCALC 116 (H) 05/10/2024   LDLDIRECT 176.7 07/12/2013   TRIG 71.0 05/10/2024   CHOLHDL 3 05/10/2024   Last hemoglobin A1c Lab Results  Component Value Date   HGBA1C 6.0 03/13/2024   Last thyroid  functions Lab Results  Component Value Date   TSH 2.20 10/26/2022   T3TOTAL 127 10/26/2022   FREET4 1.2 10/26/2022   IMPRESSION AND PLAN:  #1 health maintenance exam: Reviewed age and gender appropriate health maintenance issues (prudent diet, regular exercise, health risks of tobacco and excessive alcohol, use of seatbelts, fire alarms in home, use of sunscreen).  Also reviewed age and gender appropriate health screening as well as vaccine recommendations. Vaccines: UTD. Labs: CBC, c-Met, lipid panel, TSH, hemoglobin A1c (prediabetes, NAFLD). Prostate ca screening: average risk Carlos James= as per latest guidelines, start screening at 57 yrs of age. Colon ca screening: iFOB was negative in 2022 and cologuard NEG in 10/2023.  Plan cologuard rpt in 2028.  #2 uncontrolled hypertension, will increase Aldactone  to 50 mg/day. Monitoring electrolytes and creatinine today. He will contact cardiology (Dr. Wonda, last seen 2013) for further help with controlling his hypertension. Continue amlodipine  10 mg a day, irbesartan -HCTZ 150-12.5, 2 tabs daily, and Toprol -XL 100 mg twice daily.  3.  Morbid obesity, BMI 37, tolerating Wegovy  0.5 mg for the last 2 months. Increase to 1 mg weekly dose.  #4 chronic left knee pain. Suspect he has some significant osteoarthritis. Plain films of left knee ordered today. He will arrange return appointment soon for steroid injection.  An After Visit Summary was printed and given to the Carlos James.  FOLLOW UP: No follow-ups on  file.  Signed:  Gerlene Hockey, MD           07/10/2024     [1]  Allergies Allergen Reactions   Ciprofloxacin Nausea Only

## 2024-07-10 NOTE — Patient Instructions (Addendum)
 Take 2 of the 25 mg spironolactone  tabs every day.  Call cardiology to arrange appt with Dr. Wonda:   call [phone] to make an appointment.

## 2024-07-11 ENCOUNTER — Ambulatory Visit: Payer: Self-pay | Admitting: Family Medicine

## 2024-07-11 ENCOUNTER — Ambulatory Visit (HOSPITAL_BASED_OUTPATIENT_CLINIC_OR_DEPARTMENT_OTHER)
Admission: RE | Admit: 2024-07-11 | Discharge: 2024-07-11 | Disposition: A | Discharge status: Home / Self Care | Source: Ambulatory Visit | Attending: Family Medicine | Admitting: Family Medicine

## 2024-07-11 DIAGNOSIS — M1712 Unilateral primary osteoarthritis, left knee: Secondary | ICD-10-CM | POA: Insufficient documentation

## 2024-07-11 LAB — CBC WITH DIFFERENTIAL/PLATELET
Absolute Lymphocytes: 902 {cells}/uL (ref 850–3900)
Absolute Monocytes: 559 {cells}/uL (ref 200–950)
Basophils Absolute: 22 {cells}/uL (ref 0–200)
Basophils Relative: 0.5 %
Eosinophils Absolute: 70 {cells}/uL (ref 15–500)
Eosinophils Relative: 1.6 %
HCT: 40.8 % (ref 39.4–51.1)
Hemoglobin: 13.7 g/dL (ref 13.2–17.1)
MCH: 31.1 pg (ref 27.0–33.0)
MCHC: 33.6 g/dL (ref 31.6–35.4)
MCV: 92.7 fL (ref 81.4–101.7)
MPV: 9.6 fL (ref 7.5–12.5)
Monocytes Relative: 12.7 %
Neutro Abs: 2847 {cells}/uL (ref 1500–7800)
Neutrophils Relative %: 64.7 %
Platelets: 188 Thousand/uL (ref 140–400)
RBC: 4.4 Million/uL (ref 4.20–5.80)
RDW: 12.1 % (ref 11.0–15.0)
Total Lymphocyte: 20.5 %
WBC: 4.4 Thousand/uL (ref 3.8–10.8)

## 2024-07-11 LAB — LIPID PANEL
Cholesterol: 188 mg/dL
HDL: 74 mg/dL
LDL Cholesterol (Calc): 98 mg/dL
Non-HDL Cholesterol (Calc): 114 mg/dL
Total CHOL/HDL Ratio: 2.5 (calc)
Triglycerides: 69 mg/dL

## 2024-07-11 LAB — COMPREHENSIVE METABOLIC PANEL WITH GFR
AG Ratio: 1.4 (calc) (ref 1.0–2.5)
ALT: 36 U/L (ref 9–46)
AST: 31 U/L (ref 10–40)
Albumin: 4.5 g/dL (ref 3.6–5.1)
Alkaline phosphatase (APISO): 106 U/L (ref 36–130)
BUN: 16 mg/dL (ref 7–25)
CO2: 26 mmol/L (ref 20–32)
Calcium: 9.2 mg/dL (ref 8.6–10.3)
Chloride: 100 mmol/L (ref 98–110)
Creat: 0.77 mg/dL (ref 0.60–1.29)
Globulin: 3.2 g/dL (ref 1.9–3.7)
Glucose, Bld: 99 mg/dL (ref 65–99)
Potassium: 4.9 mmol/L (ref 3.5–5.3)
Sodium: 136 mmol/L (ref 135–146)
Total Bilirubin: 0.6 mg/dL (ref 0.2–1.2)
Total Protein: 7.7 g/dL (ref 6.1–8.1)
eGFR: 110 mL/min/1.73m2

## 2024-07-11 LAB — HEMOGLOBIN A1C
Hgb A1c MFr Bld: 5.7 % — ABNORMAL HIGH
Mean Plasma Glucose: 117 mg/dL
eAG (mmol/L): 6.5 mmol/L

## 2024-07-11 LAB — TSH: TSH: 2.49 m[IU]/L (ref 0.40–4.50)

## 2024-07-13 ENCOUNTER — Ambulatory Visit: Admitting: Family Medicine

## 2024-07-13 VITALS — BP 150/92 | HR 80 | Temp 98.2°F | Ht 71.0 in | Wt 262.8 lb

## 2024-07-13 DIAGNOSIS — M1712 Unilateral primary osteoarthritis, left knee: Secondary | ICD-10-CM

## 2024-07-13 DIAGNOSIS — G8929 Other chronic pain: Secondary | ICD-10-CM | POA: Diagnosis not present

## 2024-07-13 DIAGNOSIS — M25562 Pain in left knee: Secondary | ICD-10-CM

## 2024-07-13 MED ORDER — METHYLPREDNISOLONE ACETATE 40 MG/ML IJ SUSP
40.0000 mg | Freq: Once | INTRAMUSCULAR | Status: AC
  Administered 2024-07-13: 40 mg via INTRA_ARTICULAR

## 2024-07-13 NOTE — Progress Notes (Signed)
 OFFICE VISIT  07/13/2024  CC:  Chief Complaint  Patient presents with   Knee Pain    Patient is a 49 y.o. male who presents accompanied by his wife for left knee steroid injection.  HPI: Chronic left knee pain.  I saw him 2 days ago for this. Left knee radiograph 2 days ago showed mild osteoarthritic change, no acute findings.  He is here for trial of therapeutic steroid injection today.  Past Medical History:  Diagnosis Date   Arthritis    Colon cancer screening    Cologuard NEG 10/2023   Delayed gastric emptying 2007   per imaging in epic 08/ 2007   Edema of both lower extremities    GERD (gastroesophageal reflux disease)    History of precordial chest pain    evaluated by cardiologist, dr wonda, for atypical cp lov note in epic 08-09-2011 pt released prn ;  normal nuclear stress test 07-27-2011 w/ ef 61%;  normal ETT 2008 per note   Hyperlipidemia    Intol of atorv, rosuva, lovalo, and zetia .  Good on pravastatin .   Hypertension    Insomnia    NASH (nonalcoholic steatohepatitis)    followed by pcp---  mild elev LFTs, viral hep serologies NEG.  Fatty liver on u/s 05/2020. Stable ultrasound 07/2021.   Pre-diabetes    Right inguinal hernia    Subclinical hypothyroidism 11/2020   11/2020->TSH very mildly elev, free T4 and T3 total NORMAL.   Umbilical hernia without obstruction and without gangrene     Past Surgical History:  Procedure Laterality Date   INGUINAL HERNIA REPAIR Right 02/26/2022   Procedure: LAPAROSCOPIC RIGHT INGUINAL HERNIA REPAIR WITH MESH;  Surgeon: Stechschulte, Deward PARAS, MD;  Location: [facility];  Service: General;  Laterality: Right;   UMBILICAL HERNIA REPAIR N/A 02/26/2022   Procedure: OPEN UMBILICAL HERNIA REPAIR WITH MESH;  Surgeon: Lyndel Deward PARAS, MD;  Location: [facility];  Service: General;  Laterality: N/A;    Outpatient Medications Prior to Visit  Medication Sig Dispense Refill   amLODipine  (NORVASC ) 10 MG  tablet TAKE 1 TABLET BY MOUTH EVERY DAY 90 tablet 0   esomeprazole  (NEXIUM ) 40 MG capsule Take 1 capsule (40 mg total) by mouth 2 (two) times daily. 180 capsule 1   irbesartan -hydrochlorothiazide  (AVALIDE) 150-12.5 MG tablet Take 2 tablets by mouth daily. 180 tablet 3   metoCLOPramide  (REGLAN ) 10 MG tablet Take 1 tablet (10 mg total) by mouth 2 (two) times daily. 180 tablet 1   metoprolol  succinate (TOPROL -XL) 100 MG 24 hr tablet TAKE 1 TABLET BY MOUTH TWICE A DAY 180 tablet 0   Multiple Vitamin (MULTIVITAMIN) tablet Take 1 tablet by mouth daily.     pravastatin  (PRAVACHOL ) 40 MG tablet Take 1 tablet (40 mg total) by mouth daily. 90 tablet 1   semaglutide -weight management (WEGOVY ) 1 MG/0.5ML SOAJ SQ injection Inject 1 mg into the skin once a week. 2 mL 2   spironolactone  (ALDACTONE ) 25 MG tablet 2 tabs po qd     No facility-administered medications prior to visit.    Allergies[1]  Review of Systems  As per HPI  PE:    07/13/2024    9:25 AM 07/13/2024    9:16 AM 07/10/2024    8:24 AM  Vitals with BMI  Height  5' 11   Weight  262 lbs 13 oz   BMI  36.67   Systolic 150 152 853  Diastolic 92 91 90  Pulse  80  Physical Exam  Gen: Alert and well-appearing. Left knee without erythema, warmth, or swelling.  LABS:  none  IMPRESSION AND PLAN:  Acute on chronic left knee pain due to osteoarthritis. After options discussed the patient elected for therapeutic knee injection today.  Ultrasound-guided injection is preferred based on studies that show increased duration, increased effect, greater accuracy, decreased procedural pain, increased response rate, and decreased cost with ultrasound-guided versus blind injection. Procedure: Real-time ultrasound guided injection of left knee. Device: GE [employer] informed consent obtained.  Timeout conducted.  No overlying erythema, induration, or other signs of local infection. After sterile prep with Betadine, injected 3 mL of  lidocaine  without epi followed by a mixture of 40 mg Depo-Medrol  and 3 mL of 1% lidocaine .  Injectate seen filling suprapatellar recess. Patient tolerated the procedure well.  No immediate complications.  Post-injection care discussed. Advised to call if fever/chills, erythema, drainage, or persistent bleeding.  Impression: Technically successful ultrasound-guided injection.  An After Visit Summary was printed and given to the patient.  FOLLOW UP: Return if symptoms worsen or fail to improve in 2 wks.  Signed:  Gerlene Hockey, MD           07/13/2024     [1]  Allergies Allergen Reactions   Ciprofloxacin Nausea Only

## 2024-07-18 ENCOUNTER — Ambulatory Visit: Admitting: Family Medicine

## 2024-08-02 ENCOUNTER — Other Ambulatory Visit (HOSPITAL_COMMUNITY): Payer: Self-pay

## 2024-08-10 ENCOUNTER — Telehealth: Payer: Self-pay

## 2024-08-10 ENCOUNTER — Other Ambulatory Visit: Payer: Self-pay

## 2024-08-10 ENCOUNTER — Encounter: Payer: Self-pay | Admitting: Family Medicine

## 2024-08-10 MED ORDER — WEGOVY 1.7 MG/0.75ML ~~LOC~~ SOAJ: 1.7000 mg | SUBCUTANEOUS | 1 refills | Status: DC

## 2024-08-10 MED ORDER — WEGOVY 1.7 MG/0.75ML ~~LOC~~ SOAJ: 1.7000 mg | SUBCUTANEOUS | 1 refills | Status: AC

## 2024-08-10 MED ORDER — SPIRONOLACTONE 50 MG PO TABS: 50.0000 mg | ORAL_TABLET | Freq: Every day | ORAL | 3 refills | Status: AC

## 2024-08-10 NOTE — Addendum Note (Signed)
 Addended by: CANDISE ALEENE DEL on: 08/10/2024 12:28 PM   Modules accepted: Orders

## 2024-08-10 NOTE — Telephone Encounter (Signed)
Wegovy 1.7 mg prescription sent

## 2024-08-10 NOTE — Telephone Encounter (Signed)
 Okay, new prescription for the spironolactone  50 mg tabs sent today.  Take 1 daily.

## 2024-08-10 NOTE — Telephone Encounter (Signed)
 Pt advised

## 2024-08-10 NOTE — Telephone Encounter (Signed)
 Communication  Medication: Patient says the dosage should be increased to the 1.7MG  - semaglutide -weight management (WEGOVY ) 1 MG/0.5ML SOAJ SQ injection [[id number]]   [facility] - [place], [place] - [address]    [address], [address] [place] [address]    Phone: [phone] Fax: [phone]   Pt was seen for physical on 1/6, it is noted increase to 1mg  weekly dose. Please further advise. No meds pending

## 2024-08-10 NOTE — Telephone Encounter (Signed)
Pt advised refill sent. °

## 2024-08-10 NOTE — Addendum Note (Signed)
 Addended by: FLETA CARE D on: 08/10/2024 01:20 PM   Modules accepted: Orders
